# Patient Record
Sex: Female | Born: 1955
Health system: Southern US, Community
[De-identification: ages and names within clinical notes are randomized; demographics above are authoritative.]

## PROBLEM LIST (undated history)

## (undated) DIAGNOSIS — G4700 Insomnia, unspecified: Secondary | ICD-10-CM

## (undated) DIAGNOSIS — G44009 Cluster headache syndrome, unspecified, not intractable: Secondary | ICD-10-CM

## (undated) DIAGNOSIS — I10 Essential (primary) hypertension: Secondary | ICD-10-CM

## (undated) DIAGNOSIS — Z9889 Other specified postprocedural states: Secondary | ICD-10-CM

## (undated) DIAGNOSIS — G35 Multiple sclerosis: Secondary | ICD-10-CM

## (undated) DIAGNOSIS — G709 Myoneural disorder, unspecified: Secondary | ICD-10-CM

## (undated) DIAGNOSIS — R112 Nausea with vomiting, unspecified: Secondary | ICD-10-CM

## (undated) DIAGNOSIS — J302 Other seasonal allergic rhinitis: Secondary | ICD-10-CM

## (undated) DIAGNOSIS — D369 Benign neoplasm, unspecified site: Secondary | ICD-10-CM

## (undated) DIAGNOSIS — E7800 Pure hypercholesterolemia, unspecified: Secondary | ICD-10-CM

## (undated) DIAGNOSIS — C801 Malignant (primary) neoplasm, unspecified: Secondary | ICD-10-CM

## (undated) DIAGNOSIS — I2699 Other pulmonary embolism without acute cor pulmonale: Secondary | ICD-10-CM

## (undated) DIAGNOSIS — K559 Vascular disorder of intestine, unspecified: Secondary | ICD-10-CM

## (undated) DIAGNOSIS — Z923 Personal history of irradiation: Secondary | ICD-10-CM

## (undated) DIAGNOSIS — Z720 Tobacco use: Secondary | ICD-10-CM

## (undated) HISTORY — DX: Cluster headache syndrome, unspecified, not intractable: G44.009

## (undated) HISTORY — DX: Vascular disorder of intestine, unspecified: K55.9

## (undated) HISTORY — DX: Benign neoplasm, unspecified site: D36.9

## (undated) HISTORY — DX: Tobacco use: Z72.0

## (undated) HISTORY — DX: Essential (primary) hypertension: I10

## (undated) HISTORY — PX: BREAST BIOPSY: SHX20

## (undated) HISTORY — DX: Other pulmonary embolism without acute cor pulmonale: I26.99

## (undated) HISTORY — DX: Pure hypercholesterolemia, unspecified: E78.00

## (undated) HISTORY — DX: Insomnia, unspecified: G47.00

## (undated) HISTORY — DX: Other specified postprocedural states: Z98.890

## (undated) HISTORY — DX: Other seasonal allergic rhinitis: J30.2

## (undated) HISTORY — PX: TUBAL LIGATION: SHX77

---

## 2000-09-27 ENCOUNTER — Other Ambulatory Visit: Admission: RE | Admit: 2000-09-27 | Discharge: 2000-09-27 | Payer: Self-pay | Admitting: *Deleted

## 2001-01-04 ENCOUNTER — Ambulatory Visit (HOSPITAL_COMMUNITY): Admission: RE | Admit: 2001-01-04 | Discharge: 2001-01-04 | Payer: Self-pay | Admitting: Pulmonary Disease

## 2001-04-13 DIAGNOSIS — K559 Vascular disorder of intestine, unspecified: Secondary | ICD-10-CM

## 2001-04-13 HISTORY — DX: Vascular disorder of intestine, unspecified: K55.9

## 2001-04-13 HISTORY — PX: COLONOSCOPY: SHX174

## 2002-01-05 ENCOUNTER — Ambulatory Visit (HOSPITAL_COMMUNITY): Admission: RE | Admit: 2002-01-05 | Discharge: 2002-01-05 | Payer: Self-pay | Admitting: Pulmonary Disease

## 2002-01-13 ENCOUNTER — Ambulatory Visit (HOSPITAL_COMMUNITY): Admission: RE | Admit: 2002-01-13 | Discharge: 2002-01-13 | Payer: Self-pay | Admitting: Pulmonary Disease

## 2002-03-02 ENCOUNTER — Ambulatory Visit (HOSPITAL_COMMUNITY): Admission: RE | Admit: 2002-03-02 | Discharge: 2002-03-02 | Payer: Self-pay | Admitting: Pulmonary Disease

## 2002-03-31 ENCOUNTER — Ambulatory Visit (HOSPITAL_COMMUNITY): Admission: RE | Admit: 2002-03-31 | Discharge: 2002-03-31 | Payer: Self-pay | Admitting: Internal Medicine

## 2002-04-13 DIAGNOSIS — D369 Benign neoplasm, unspecified site: Secondary | ICD-10-CM

## 2002-04-13 HISTORY — DX: Benign neoplasm, unspecified site: D36.9

## 2003-01-22 ENCOUNTER — Ambulatory Visit (HOSPITAL_COMMUNITY): Admission: RE | Admit: 2003-01-22 | Discharge: 2003-01-22 | Payer: Self-pay | Admitting: Pulmonary Disease

## 2003-08-10 ENCOUNTER — Ambulatory Visit (HOSPITAL_COMMUNITY): Admission: RE | Admit: 2003-08-10 | Discharge: 2003-08-10 | Payer: Self-pay | Admitting: Pulmonary Disease

## 2003-10-23 ENCOUNTER — Ambulatory Visit (HOSPITAL_COMMUNITY): Admission: RE | Admit: 2003-10-23 | Discharge: 2003-10-23 | Payer: Self-pay | Admitting: Pulmonary Disease

## 2004-01-29 ENCOUNTER — Ambulatory Visit (HOSPITAL_COMMUNITY): Admission: RE | Admit: 2004-01-29 | Discharge: 2004-01-29 | Payer: Self-pay

## 2005-02-03 ENCOUNTER — Ambulatory Visit (HOSPITAL_COMMUNITY): Admission: RE | Admit: 2005-02-03 | Discharge: 2005-02-03 | Payer: Self-pay | Admitting: Pulmonary Disease

## 2005-05-19 ENCOUNTER — Ambulatory Visit (HOSPITAL_COMMUNITY): Admission: RE | Admit: 2005-05-19 | Discharge: 2005-05-19 | Payer: Self-pay | Admitting: Pulmonary Disease

## 2005-05-21 ENCOUNTER — Ambulatory Visit (HOSPITAL_COMMUNITY): Admission: RE | Admit: 2005-05-21 | Discharge: 2005-05-21 | Payer: Self-pay | Admitting: Pulmonary Disease

## 2005-07-29 ENCOUNTER — Encounter: Admission: RE | Admit: 2005-07-29 | Discharge: 2005-07-29 | Payer: Self-pay | Admitting: Neurosurgery

## 2005-09-17 ENCOUNTER — Ambulatory Visit: Payer: Self-pay | Admitting: Internal Medicine

## 2005-09-17 ENCOUNTER — Ambulatory Visit (HOSPITAL_COMMUNITY): Admission: RE | Admit: 2005-09-17 | Discharge: 2005-09-17 | Payer: Self-pay | Admitting: Internal Medicine

## 2005-09-17 ENCOUNTER — Encounter (INDEPENDENT_AMBULATORY_CARE_PROVIDER_SITE_OTHER): Payer: Self-pay | Admitting: *Deleted

## 2005-09-17 HISTORY — PX: COLONOSCOPY: SHX174

## 2006-02-08 ENCOUNTER — Ambulatory Visit (HOSPITAL_COMMUNITY): Admission: RE | Admit: 2006-02-08 | Discharge: 2006-02-08 | Payer: Self-pay | Admitting: Pulmonary Disease

## 2007-02-10 ENCOUNTER — Ambulatory Visit (HOSPITAL_COMMUNITY): Admission: RE | Admit: 2007-02-10 | Discharge: 2007-02-10 | Payer: Self-pay | Admitting: Unknown Physician Specialty

## 2007-03-02 ENCOUNTER — Ambulatory Visit (HOSPITAL_COMMUNITY): Admission: RE | Admit: 2007-03-02 | Discharge: 2007-03-02 | Payer: Self-pay | Admitting: Unknown Physician Specialty

## 2008-03-02 ENCOUNTER — Ambulatory Visit (HOSPITAL_COMMUNITY): Admission: RE | Admit: 2008-03-02 | Discharge: 2008-03-02 | Payer: Self-pay | Admitting: Pulmonary Disease

## 2008-03-23 ENCOUNTER — Ambulatory Visit: Payer: Self-pay | Admitting: Urgent Care

## 2008-09-10 ENCOUNTER — Emergency Department (HOSPITAL_COMMUNITY): Admission: EM | Admit: 2008-09-10 | Discharge: 2008-09-10 | Payer: Self-pay | Admitting: Emergency Medicine

## 2008-09-12 ENCOUNTER — Encounter: Admission: RE | Admit: 2008-09-12 | Discharge: 2008-09-12 | Payer: Self-pay | Admitting: Internal Medicine

## 2008-09-15 ENCOUNTER — Inpatient Hospital Stay (HOSPITAL_COMMUNITY): Admission: EM | Admit: 2008-09-15 | Discharge: 2008-09-25 | Payer: Self-pay | Admitting: Emergency Medicine

## 2009-01-09 ENCOUNTER — Ambulatory Visit (HOSPITAL_COMMUNITY): Admission: RE | Admit: 2009-01-09 | Discharge: 2009-01-09 | Payer: Self-pay | Admitting: Internal Medicine

## 2009-01-15 ENCOUNTER — Ambulatory Visit (HOSPITAL_COMMUNITY): Admission: RE | Admit: 2009-01-15 | Discharge: 2009-01-15 | Payer: Self-pay | Admitting: Internal Medicine

## 2009-03-04 ENCOUNTER — Ambulatory Visit (HOSPITAL_COMMUNITY): Admission: RE | Admit: 2009-03-04 | Discharge: 2009-03-04 | Payer: Self-pay | Admitting: Internal Medicine

## 2010-02-03 ENCOUNTER — Ambulatory Visit (HOSPITAL_COMMUNITY): Admission: RE | Admit: 2010-02-03 | Discharge: 2010-02-03 | Payer: Self-pay | Admitting: Internal Medicine

## 2010-03-13 ENCOUNTER — Ambulatory Visit (HOSPITAL_COMMUNITY): Admission: RE | Admit: 2010-03-13 | Discharge: 2010-03-13 | Payer: Self-pay | Admitting: Internal Medicine

## 2010-05-04 ENCOUNTER — Encounter: Payer: Self-pay | Admitting: Unknown Physician Specialty

## 2010-07-21 LAB — URINALYSIS, ROUTINE W REFLEX MICROSCOPIC
Bilirubin Urine: NEGATIVE
Glucose, UA: NEGATIVE mg/dL
Hgb urine dipstick: NEGATIVE
Ketones, ur: NEGATIVE mg/dL
Nitrite: NEGATIVE
Protein, ur: NEGATIVE mg/dL
Specific Gravity, Urine: 1.019 (ref 1.005–1.030)
Urobilinogen, UA: 1 mg/dL (ref 0.0–1.0)
pH: 7 (ref 5.0–8.0)

## 2010-07-21 LAB — URINE CULTURE: Colony Count: 65000

## 2010-08-26 NOTE — Assessment & Plan Note (Signed)
NAME:  Joan [name], Joan [name]             CHART#:  [id number]   DATE:  03/23/2008                       DOB:  1956/02/04   PRIMARY CARE PHYSICIAN:  Ramon Dredge L. Juanetta Gosling, MD   CHIEF COMPLAINT:  Followup, bloody diarrhea.   HISTORY OF PRESENT ILLNESS:  The patient is a 55 year old Caucasian  female.  She tells me she had been raking leaves back in November.  She  was doing a lot of heavy lifting.  She began to have right-sided low  back pain, which is not uncommon for her.  She was taking 2-3 ibuprofen  2-3 times per day as well as over-the-counter sinus medications.  She  began to have cramps throughout her abdomen.  She had diarrhea with  blood and mucus on one episode.  She called, and she was started on  Cipro and Flagyl for 10 days.  She had a stool culture and C. diff which  she reports was negative, although I do not have copies of this.  She  did have a CT scan of the abdomen and pelvis on March 10, 2008, with  contrast, which was benign, although sigmoid colon was decompressed and  unopacified by contrast.  She had had a previous episode of what was  felt to be ischemic colitis back in 2003.  She has finished her course  of antibiotics.  She is doing well.  She has had no further bleeding.  No nausea, vomiting, fever, chills, or abdominal pain.  She has had some  constipation and is going a couple of days without a bowel movement.   PAST MEDICAL AND SURGICAL HISTORY:  1. History of ischemic colitis back in 2003.  2. History of tubular adenoma removed from cecum on colonoscopy,      December 2003, and on last colonoscopy by Dr. Jena Gauss  on that date,      she had 2 hyperplastic polyps removed from the descending colon in      20 cm.  3. Seasonal allergies.  4. Insomnia.  5. Tubal ligation.  6. Left benign breast biopsy.  7. Pulmonary embolus 26 years ago.  8. Tobacco abuse.   CURRENT MEDICATIONS:  Tylenol p.r.n., ibuprofen 200-400 mg b.i.d. to  t.i.d., over-the-counter sleep  medications p.r.n., and over-the-counter  allergy meds p.r.n.   ALLERGIES:  No known drug allergies.   FAMILY HISTORY:  There is no known family history of colorectal  carcinoma, liver problem, or GI problems.  Mother at age 65 is healthy.  Father at age 14 has history of coronary artery disease.  She has 2  siblings, both of whom are healthy.   SOCIAL HISTORY:  The patient is married.  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  She has a 30-plus-pack-year history of tobacco use.  Denies any alcohol or drug use.   REVIEW OF SYSTEMS:  See HPI, otherwise negative.   PHYSICAL EXAMINATION:  VITAL SIGNS:  Weight 265, height 66 inches,  temperature 97.7, blood pressure 144/88, and pulse 80.  GENERAL:  She is a well-developed, well-nourished Caucasian female in no  acute distress.  HEENT:  Sclerae clear, nonicteric.  Conjunctivae pink.  Oropharynx pink  and moist without any lesions.  NECK:  Supple without mass or thyromegaly.  CHEST:  Heart regular rate and rhythm, normal S1 and S2, without  murmurs,  clicks, rubs, or gallops.  LUNGS:  Clear to auscultation bilaterally.  ABDOMEN:  Positive bowel sounds x4.  No bruits auscultated.  Soft,  nontender, nondistended without palpable mass or hepatosplenomegaly.  No  rebound, tenderness, or guarding.  EXTREMITIES:  Without clubbing or edema.   ASSESSMENT AND PLAN:  1. The patient is a 55 year old Caucasian female who I suspect she has      had another bout of ischemic colitis versus nonsteroidal-anti-      inflammatory-drug-induced gastrointestinal tract bleeding.      Nonetheless, her course was self-limited.  She has had no further      problems.  She does have a history of tubular adenoma, and if she      has recurrent bleeding would consider colonoscopy, otherwise she is      not due until 2012.  2. She has had some chronic constipation recently.  I will plan to add      stool softeners and fiber supplement of choice.  I have  recommended      Benefiber.  She needs to quit smoking.  3. She is going to take the smallest dose possible of ibuprofen and      try Tylenol PM first.  If she is having to take large quantities of      ibuprofen, she is going to speak with Dr. Juanetta Gosling regarding a non-      nonsteroidal-anti-inflammatory pain medication.  Constipation      literature was given for review.  4. She can take MiraLax 17 g daily p.r.n. constipation.  5. Colonoscopy in June 2012.  6. She is going to call us if she has another bout of bleeding.  At      that time, we will do colonoscopy.      Lorenza Burton, N.P.  Electronically Signed     R. Roetta Sessions, M.D.  Electronically Signed   KJ/MEDQ  D:  03/23/2008  T:  03/23/2008  Job:  [id number]   cc:   Ramon Dredge L. Juanetta Gosling, M.D.
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
## 2010-08-29 NOTE — Consult Note (Signed)
NAME:  Joan [name], Joan [name]                        ACCOUNT NO.:  [id number]   MEDICAL RECORD NO.:  [id number]                  PATIENT TYPE:   LOCATION:                                       FACILITY:  [facility]   PHYSICIAN:  R. Roetta Sessions, M.D.              DATE OF BIRTH:  1956-03-17   DATE OF CONSULTATION:  03/22/2002  DATE OF DISCHARGE:                                   CONSULTATION   REASON FOR CONSULTATION:  Hematochezia, colitis.   HISTORY OF PRESENT ILLNESS:  The patient is a 55 year old Caucasian female  patient of Dr. Juanetta Gosling who presents today for further evaluation of a recent  episode of abdominal pain associated with hematochezia and evidence of  colitis on CT.  She woke up one morning in late November 2003 with severe  abdominal pain which felt like she needed to have diarrhea.  When she went  to the bathroom she passed blood with mucus, moderate amount.  She had  several episodes throughout the day.  This went on for approximately eight  to 10 hours.  She went to see Dr. Juanetta Gosling who arranged her to have a CT of  the abdomen and pelvis on the same day.  This revealed bowel wall thickening  associated with a distal descending colon and the sigmoid colon, a  questionable haustral thickening within the transverse colon and the splenic  flexure.  There was no evidence of diverticula seen in the descending or  sigmoid colon where the region of thickening was.  It was felt that this was  nonspecific appearance which could be related to ischemic colitis,  inflammatory colitis, or infectious colitis.  She also had a 1.7 cm  partially collapsed right ovarian cyst.  Terminal ileum revealed no CT  evidence of Crohn's disease.  Appendix appeared normal.  The patient also  was noted to have elevated white count per her report.  She was started on  two oral antibiotics which she cannot recall the names.  She rapidly  improved and actually symptoms lasted less than 24 hours.  She  complains of  subjective fever at that time.  Denies any nausea or vomiting, heartburn,  dysphagia, odynophagia.  Her bowels generally move every other day prior to  this episode.  She is back to normal bowel pattern at this time.  She has  had no further rectal bleeding or any melena.  She does take BC powders and  Aleve p.r.n. headache, although states that she will go weeks at a time  without taking any medication.  The patient was treated for an abscessed  tooth with antibiotics for two weeks prior to this episode of hematochezia  and abdominal pain.   CURRENT MEDICATIONS:  1. Multivitamin q.d.  2. Tylenol p.r.n.  3. BC powder p.r.n.  4. Aleve p.r.n.   ALLERGIES:  No known drug allergies.   PAST MEDICAL HISTORY:  1. Headache.  2.  Remote history of pulmonary embolus felt to be due to birth control     pills.   PAST SURGICAL HISTORY:  1. Benign breast biopsy.  2. Tubal ligation.   FAMILY HISTORY:  Negative for colorectal cancer, inflammatory bowel disease,  liver disease, or any other chronic GI illnesses.   SOCIAL HISTORY:  She has been married for 25 years.  She has two grown sons.  She works for __________ [employer]  She smokes a pack of cigarettes daily and  smoked for over 20 years.  Denies any alcohol use.   REVIEW OF SYSTEMS:  Please see HPI for GI.  GENERAL:  Denies any weight  loss.  CARDIOPULMONARY:  Denies any chest pain or shortness of breath.  GENITOURINARY:  Denies any dysuria, urinary frequency, or hematuria.   PHYSICAL EXAMINATION:  VITAL SIGNS:  Weight 198, height 5 feet 6.5 inches,  temperature 97.9, blood pressure 130/78, pulse 92.  GENERAL:  Pleasant, well-nourished, well-developed Caucasian female in no  acute distress.  SKIN:  Warm and dry.  No jaundice.  HEENT:  Pupils are equal, round, and reactive to light.  Conjunctivae are  pink.  Sclerae nonicteric.  Oropharyngeal mucosa moist and pink.  No  lesions, erythema, or exudate.  NECK:  No  lymphadenopathy, thyromegaly, carotid bruits.  CHEST:  Lungs are clear to auscultation.  CARDIAC:  Regular rate and rhythm.  Normal S1, S2.  No murmurs, rubs, or  gallops.  ABDOMEN:  Positive bowel sounds, soft, nontender, nondistended.  No  organomegaly or masses.  EXTREMITIES:  No edema.   IMPRESSION:  The patient is a pleasant 55 year old lady who had a less than  24-hour history of moderate amount of bloody mucus per rectum associated  with acute onset abdominal pain.  CT revealed evidence of bowel wall  thickening in the distal descending colon and sigmoid colon as well as  possible haustral thickening within the transverse colon and the splenic  flexure.  Her colitis was symptomatically better within 24 hours.  She was  treated with course of antibiotics.  It is unclear at this time if she had  infectious colitis versus possible ischemic or inflammatory colitis.  It was  reassuring that she is better at this point.  Given the fact that she did  not have any diarrhea with this, this would make infectious colitis less  likely.  Ischemic colitis is most likely a possibility.  I did discuss with  her today that we ought to proceed with colonoscopy for further evaluation  of these symptoms.  We need to rule out etiologies such as inflammatory  bowel disease as well as polyps or colon cancer.  We discussed risks,  alternatives, and benefits with the patient and she is agreeable to proceed.   PLAN:  1. Colonoscopy in the near future.  2. She will hold her aspirin products for at least five days prior to     procedure.  3. Further recommendations to follow.     Tana Coast, Pricilla Larsson, M.D.   LL/MEDQ  D:  03/22/2002  T:  03/22/2002  Job:  [id number]   cc:   Ramon Dredge L. Juanetta Gosling, M.D.  [address]  [place]  [place] [address]  Fax: [phone]

## 2010-08-29 NOTE — Op Note (Signed)
NAME:  Joan [name], Joan [name]            ACCOUNT NO.:  [id number]   MEDICAL RECORD NO.:  [id number]          PATIENT TYPE:  AMB   LOCATION:  DAY                           FACILITY:  [facility]   PHYSICIAN:  R. Roetta Sessions, M.D. DATE OF BIRTH:  [birth year]   DATE OF PROCEDURE:  09/17/2005  DATE OF DISCHARGE:                                 OPERATIVE REPORT   PROCEDURE PERFORMED:  Colonoscopy with snare polypectomy, biopsy, lesion  ablation.   INDICATIONS FOR PROCEDURE:  The patient is a 55 year old Caucasian female  who was found to have a colonic adenoma in her cecum which was removed back  in 2004.  She is here for surveillance.  She is having no lower GI tract  symptoms.  This approach has been discussed with the patient at length.  Potential risks, benefits and alternatives have been reviewed, questions  have been answered, she is agreeable.  Please see documentation in the  medical records.   PROCEDURE NOTE:  Oxygen saturations, blood pressure, pulse and respirations  were monitored throughout the entire procedure.   CONSCIOUS SEDATION:  Versed 3 mg IV, Demerol 75 mg IV in divided doses.   INSTRUMENT USED:  Olympus video chip system.   FINDINGS:  Digital exam revealed no abnormalities.   ENDOSCOPIC FINDINGS:  Prep was adequate.   Rectum:  Examination of the rectal mucosa including retroflex view of the  anal verge revealed multiple 2 to 3 mm polyps.  The remainder of the rectal  mucosa appeared normal.  At the rectosigmoid junction there was a 4 mm polyp  which was sessile.   The colonic mucosa was surveyed from the rectosigmoid junction to the left,  transverse and right colon to the area of the appendiceal orifice and  ileocecal valve and cecum.  These structures were well seen and photographed  for the record.  From this level, the scope was slowly withdrawn.  All  previously mentioned mucosal surfaces were again seen.  There was a 5 mm  polyp on a broad stalk in the  middescending colon.  The remainder of the  colonic mucosa appeared normal.  The polyp in the middescending colon was  removed with cold snare technique.  The polyp at 20 cm removed with cold  biopsy forceps technique.  Multiple diminutive polyps in the rectum were  ablated with the tip of the hot snare cautery unit.  The patient tolerated  the procedure well, was reacted in endoscopy.   IMPRESSION:  1.  Multiple diminutive rectal polyps ablated as described above.  20 cm      polyp, cold biopsied/removed.  Remaining rectal mucosa appeared normal.  2.  Middescending colon polyp removed with snare.  Remainder of colonic      mucosa appeared normal.   RECOMMENDATIONS:  1.  No aspirin or arthritis medications for 10 days.  2.  Follow-up on pathology.  3.  Further recommendations to follow.      Jonathon Bellows, M.D.  Electronically Signed     RMR/MEDQ  D:  09/17/2005  T:  09/17/2005  Job:  [id number]   cc:  Edward L. Luan Pulling, M.D.  Fax: [phone]

## 2010-08-29 NOTE — Op Note (Signed)
NAME:  Joan [name], Joan [name]                      ACCOUNT NO.:  [id number]   MEDICAL RECORD NO.:  [id number]                   PATIENT TYPE:  AMB   LOCATION:  DAY                                  FACILITY:  [facility]   PHYSICIAN:  R. Roetta Sessions, M.D.              DATE OF BIRTH:  01-04-1956   DATE OF PROCEDURE:  03/31/2002  DATE OF DISCHARGE:                                 OPERATIVE REPORT   PROCEDURE:  Colonoscopy with biopsy.   INDICATION FOR PROCEDURE:  The patient is [name] 55 year old lady with [name] recent  episode of crampy abdominal pain and hematochezia.  CT demonstrated colitis.  Clinically, her acute illness came and went over [name] 24-hour period.  She is  currently devoid of any lower GI tract symptoms.  Colonoscopy is now being  done to further evaluate her recent symptoms and the CT abnormalities.  The  approach has been discussed with the patient at length, and the risks,  benefits, and alternatives have been reviewed.  ASA-1.   DESCRIPTION OF PROCEDURE:  O2 saturation, blood pressure, pulse, and  respirations were monitored throughout the entire procedure.   Conscious sedation:  Versed 3 mg IV, Demerol 50 mg IV in divided doses.   Instrument:  Olympus video chip adult colonoscope.   Findings:  Digital rectal exam revealed no abnormalities.   Endoscopic findings:  Prep was excellent.   Rectum:  Examination of the rectal mucosa, including [name] retroflexed view of  the anal verge, revealed only internal hemorrhoids.  The remainder of the  rectal mucosa appeared normal.   Colon:  The colonic mucosa was surveyed from the rectosigmoid junction  through the left, transverse, and right colon, through the area of the  appendiceal orifice, ileocecal valve, and cecum.  These structures were well-  seen and photographed for the record.  The patient was noted to have three 2-  3 mm sessile polyps at 30 cm, cold biopsied/removed.  There was another  single 3 mm polyp adjacent to the  appendiceal orifice, which was cold  biopsied/removed.  The remainder of the colonic mucosa appeared normal.  The  terminal ileum was intubated to 10 cm.  This segment of the GI tract  appeared normal.  From the level of the ileocecal valve/cecum, the scope was  slowly and cautiously withdrawn.  All previously-mentioned mucosal surfaces  were again seen.  No other abnormalities were observed.  The patient  tolerated the procedure well and was reactive in endoscopy.   IMPRESSION:  1. Internal hemorrhoids, otherwise normal rectum.  2. Diminutive polyps in the sigmoid and cecum as described above, cold     biopsied/removed.  The remainder of the colonic mucosa appeared normal.  3. Normal terminal ileum.   I suspect the patient most likely experienced an episode of ischemic or  segmental colitis.  Clinically her symptoms came and went within 24 hours,  which is really too fast for an  infectious agent.  I do note that she does  smoke and, interestingly, has [name] history of pulmonary embolism previously.   RECOMMENDATIONS:  1. Stop smoking.  2. No further GI evaluation warranted for her recent acute illness unless     she has recurrent symptoms, which is unlikely.  3. Follow up on pathology.  4. Further recommendations to follow.                                               Jonathon Bellows, M.D.    RMR/MEDQ  D:  03/31/2002  T:  04/01/2002  Job:  [id number]   cc:   Ramon Dredge L. Juanetta Gosling, M.D.  [address]  [place]  [place] [address]  Fax: [phone]

## 2010-09-19 ENCOUNTER — Encounter: Payer: Self-pay | Admitting: Internal Medicine

## 2011-02-13 ENCOUNTER — Telehealth: Payer: Self-pay | Admitting: Internal Medicine

## 2011-02-13 NOTE — Telephone Encounter (Signed)
Pt has hx of polyps and is scheduled an OV with Gerrit Halls, NP on 02/23/2011 @ 8:30 AM.

## 2011-02-13 NOTE — Telephone Encounter (Signed)
Pt received letter awhile back and is ready to set up her colonoscopy. She will be home today until 10am if we could call her before then. [phone]

## 2011-02-20 ENCOUNTER — Encounter: Payer: Self-pay | Admitting: Gastroenterology

## 2011-02-23 ENCOUNTER — Encounter: Payer: Self-pay | Admitting: Gastroenterology

## 2011-02-23 ENCOUNTER — Ambulatory Visit (INDEPENDENT_AMBULATORY_CARE_PROVIDER_SITE_OTHER): Payer: BC Managed Care – PPO | Admitting: Gastroenterology

## 2011-02-23 VITALS — BP 120/74 | HR 74 | Temp 97.6°F | Ht 66.0 in | Wt 194.4 lb

## 2011-02-23 DIAGNOSIS — D369 Benign neoplasm, unspecified site: Secondary | ICD-10-CM

## 2011-02-23 NOTE — Patient Instructions (Signed)
We have set you up for a colonoscopy with Dr. Jena Gauss.  Further recommendations to follow in the near future.

## 2011-02-23 NOTE — Progress Notes (Signed)
Primary Care Physician:  Dwana Melena, MD Primary Gastroenterologist:  Dr. Jena Gauss  Chief Complaint  Patient presents with  . Colonoscopy    HPI:   Joan [name] is a pleasant 55 year old female who presents today for surveillance colonoscopy. She has a history of a tubular adenoma in 2004; repeat colonoscopy in 2007 showed hyperplastic polyps. She is due for surveillance now, 2012.  She denies any abdominal pain, nausea or vomiting. She denies any change in bowel habits, constipation, or diarrhea. She denies any blood in stool, weight loss, or lack of appetite. She states she is doing great without any concerns at this visit.   Past Medical History  Diagnosis Date  . PE (pulmonary embolism)     ?birth control  . Headaches, cluster   . Seasonal allergies   . Insomnia   . Tobacco abuse   . Colitis, ischemic 2003  . Hypertension   . Hypercholesterolemia   . Tubular adenoma 2004    colonoscopy 2004  . S/P colonoscopy [id number]    2004: tubular adeboma, 2007: hyperplastic polyps. Due 2012    Past Surgical History  Procedure Date  . Colonoscopy 09/17/05    20 cm polyp removed/bx/middescending colon polyp removed with snare  . Tubal ligation   . Breast biopsy     benign  . Colonoscopy 2003    tubular adenoma removed from cecum     Current Outpatient Prescriptions  Medication Sig Dispense Refill  . CRESTOR 5 MG tablet Take 5 mg by mouth daily.       . valsartan-hydrochlorothiazide (DIOVAN-HCT) 80-12.5 MG per tablet Take 1 tablet by mouth daily.          Allergies as of 02/23/2011  . (No Known Allergies)    Family History  Problem Relation Age of Onset  . Colon cancer Neg Hx     History   Social History  . Marital Status: Married    Spouse Name: N/A    Number of Children: N/A  . Years of Education: N/A   Occupational History  . [employer]    Social History Main Topics  . Smoking status: Current Everyday Smoker -- 1.0 packs/day    Types: Cigarettes  .  Smokeless tobacco: Not on file  . Alcohol Use: No  . Drug Use: No  . Sexually Active: Not on file   Other Topics Concern  . Not on file   Social History Narrative  . No narrative on file    Review of Systems: Gen: Denies any fever, chills, fatigue, weight loss, lack of appetite.  CV: Denies chest pain, heart palpitations, peripheral edema, syncope.  Resp: Denies shortness of breath at rest or with exertion. Denies wheezing or cough.  GI: Denies dysphagia or odynophagia. Denies jaundice, hematemesis, fecal incontinence. GU : Denies urinary burning, urinary frequency, urinary hesitancy MS: Denies joint pain, muscle weakness, cramps, or limitation of movement.  Derm: Denies rash, itching, dry skin Psych: Denies depression, anxiety, memory loss, and confusion Heme: Denies bruising, bleeding, and enlarged lymph nodes.  Physical Exam: BP 120/74  Pulse 74  Temp(Src) 97.6 F (36.4 C) (Temporal)  Ht 5\' 6"  (1.676 m)  Wt 194 lb 6.4 oz (88.179 kg)  BMI 31.38 kg/m2 General:   Alert and oriented. Pleasant and cooperative. Well-nourished and well-developed.  Head:  Normocephalic and atraumatic. Eyes:  Without icterus, sclera clear and conjunctiva pink.  Ears:  Normal auditory acuity. Nose:  No deformity, discharge,  or lesions. Mouth:  No deformity or lesions, oral mucosa  pink.  Neck:  Supple, without mass or thyromegaly. Lungs:  Clear to auscultation bilaterally. No wheezes, rales, or rhonchi. No distress.  Heart:  S1, S2 present without murmurs appreciated.  Abdomen:  +BS, soft, non-tender and non-distended. No HSM noted. No guarding or rebound. No masses appreciated.  Rectal:  Deferred  Msk:  Symmetrical without gross deformities. Normal posture. Extremities:  Without clubbing or edema. Neurologic:  Alert and  oriented x4;  grossly normal neurologically. Skin:  Intact without significant lesions or rashes. Cervical Nodes:  No significant cervical adenopathy. Psych:  Alert and  cooperative. Normal mood and affect.

## 2011-02-23 NOTE — Assessment & Plan Note (Signed)
55 year old female with hx of tubular adenoma found on colonoscopy in 2004; f/u colonoscopy in 2007 with hyperplastic polyps. She is due for 5 year surveillance at this time. She has absolutely no lower GI symptoms presently. She is doing quite well, and she is ready to proceed with a colonoscopy around December time-frame.  Proceed with TCS with Dr. Jena Gauss in near future: the risks, benefits, and alternatives have been discussed with the patient in detail. The patient states understanding and desires to proceed.

## 2011-02-23 NOTE — Progress Notes (Signed)
Cc to PCP 

## 2011-02-25 ENCOUNTER — Telehealth: Payer: Self-pay | Admitting: Gastroenterology

## 2011-02-25 NOTE — Telephone Encounter (Signed)
Pt called to set up her procedure. 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 Pt said we couldn't reach her at her work number and she gave me dates that she would be available for procedure. December 12,13,14,1718,19 or 20th. She said we could leave a message on her home phone.

## 2011-03-02 ENCOUNTER — Other Ambulatory Visit: Payer: Self-pay | Admitting: Gastroenterology

## 2011-03-02 ENCOUNTER — Encounter: Payer: Self-pay | Admitting: Gastroenterology

## 2011-03-02 DIAGNOSIS — D369 Benign neoplasm, unspecified site: Secondary | ICD-10-CM

## 2011-03-02 NOTE — Telephone Encounter (Signed)
Scheduled pt for 03/31/11 @ 10:30- LMOM and instructions mailed

## 2011-03-09 ENCOUNTER — Other Ambulatory Visit (HOSPITAL_COMMUNITY): Payer: Self-pay | Admitting: Internal Medicine

## 2011-03-09 DIAGNOSIS — Z139 Encounter for screening, unspecified: Secondary | ICD-10-CM

## 2011-03-17 ENCOUNTER — Ambulatory Visit (HOSPITAL_COMMUNITY)
Admission: RE | Admit: 2011-03-17 | Discharge: 2011-03-17 | Disposition: A | Discharge status: Home / Self Care | Payer: BC Managed Care – PPO | Source: Ambulatory Visit | Attending: Internal Medicine | Admitting: Internal Medicine

## 2011-03-17 DIAGNOSIS — Z139 Encounter for screening, unspecified: Secondary | ICD-10-CM

## 2011-03-17 DIAGNOSIS — Z1231 Encounter for screening mammogram for malignant neoplasm of breast: Secondary | ICD-10-CM | POA: Insufficient documentation

## 2011-03-19 ENCOUNTER — Encounter (HOSPITAL_COMMUNITY): Payer: Self-pay | Admitting: Pharmacy Technician

## 2011-03-30 MED ORDER — SODIUM CHLORIDE 0.45 % IV SOLN
Freq: Once | INTRAVENOUS | Status: AC
  Administered 2011-03-31: 10:00:00 via INTRAVENOUS

## 2011-03-31 ENCOUNTER — Ambulatory Visit (HOSPITAL_COMMUNITY)
Admission: RE | Admit: 2011-03-31 | Discharge: 2011-03-31 | Disposition: A | Discharge status: Home / Self Care | Payer: BC Managed Care – PPO | Source: Ambulatory Visit | Attending: Internal Medicine | Admitting: Internal Medicine

## 2011-03-31 ENCOUNTER — Encounter (HOSPITAL_COMMUNITY): Payer: Self-pay | Admitting: *Deleted

## 2011-03-31 ENCOUNTER — Other Ambulatory Visit: Payer: Self-pay | Admitting: Internal Medicine

## 2011-03-31 ENCOUNTER — Encounter (HOSPITAL_COMMUNITY)
Admission: RE | Disposition: A | Discharge status: Home / Self Care | Payer: Self-pay | Source: Ambulatory Visit | Attending: Internal Medicine

## 2011-03-31 DIAGNOSIS — D369 Benign neoplasm, unspecified site: Secondary | ICD-10-CM

## 2011-03-31 DIAGNOSIS — D126 Benign neoplasm of colon, unspecified: Secondary | ICD-10-CM | POA: Insufficient documentation

## 2011-03-31 DIAGNOSIS — I10 Essential (primary) hypertension: Secondary | ICD-10-CM | POA: Insufficient documentation

## 2011-03-31 DIAGNOSIS — Z860100 Personal history of colon polyps, unspecified: Secondary | ICD-10-CM | POA: Insufficient documentation

## 2011-03-31 DIAGNOSIS — K620 Anal polyp: Secondary | ICD-10-CM

## 2011-03-31 DIAGNOSIS — Z79899 Other long term (current) drug therapy: Secondary | ICD-10-CM | POA: Insufficient documentation

## 2011-03-31 DIAGNOSIS — K621 Rectal polyp: Secondary | ICD-10-CM

## 2011-03-31 DIAGNOSIS — K5730 Diverticulosis of large intestine without perforation or abscess without bleeding: Secondary | ICD-10-CM | POA: Insufficient documentation

## 2011-03-31 DIAGNOSIS — E7800 Pure hypercholesterolemia, unspecified: Secondary | ICD-10-CM | POA: Insufficient documentation

## 2011-03-31 HISTORY — PX: COLONOSCOPY: SHX5424

## 2011-03-31 SURGERY — COLONOSCOPY
Anesthesia: Moderate Sedation

## 2011-03-31 MED ORDER — MEPERIDINE HCL 100 MG/ML IJ SOLN
INTRAMUSCULAR | Status: AC
  Filled 2011-03-31: qty 1

## 2011-03-31 MED ORDER — ONDANSETRON HCL 4 MG/2ML IJ SOLN
INTRAMUSCULAR | Status: AC
  Filled 2011-03-31: qty 2

## 2011-03-31 MED ORDER — STERILE WATER FOR IRRIGATION IR SOLN
Status: DC | PRN
  Administered 2011-03-31: 10:00:00

## 2011-03-31 MED ORDER — MIDAZOLAM HCL 5 MG/5ML IJ SOLN
INTRAMUSCULAR | Status: DC | PRN
  Administered 2011-03-31 (×2): 2 mg via INTRAVENOUS
  Administered 2011-03-31 (×3): 1 mg via INTRAVENOUS

## 2011-03-31 MED ORDER — MIDAZOLAM HCL 5 MG/5ML IJ SOLN
INTRAMUSCULAR | Status: AC
  Filled 2011-03-31: qty 10

## 2011-03-31 MED ORDER — ONDANSETRON HCL 4 MG/2ML IJ SOLN
INTRAMUSCULAR | Status: DC | PRN
  Administered 2011-03-31: 4 mg via INTRAVENOUS

## 2011-03-31 MED ORDER — MEPERIDINE HCL 100 MG/ML IJ SOLN
INTRAMUSCULAR | Status: DC | PRN
  Administered 2011-03-31: 25 mg via INTRAVENOUS
  Administered 2011-03-31: 50 mg via INTRAVENOUS

## 2011-03-31 NOTE — H&P (Signed)
  I have seen & examined the patient prior to the procedure(s) today and reviewed the history and physical/consultation.  There have been no changes.  After consideration of the risks, benefits, alternatives and imponderables, the patient has consented to the procedure(s).   

## 2011-03-31 NOTE — Op Note (Signed)
[facility] [address] [place], [place]  [address]  COLONOSCOPY PROCEDURE REPORT  PATIENT:  Joan [name], Joan [name]  MR#:  [id number] BIRTHDATE:  09/25/1955, 55 yrs. old  GENDER:  female ENDOSCOPIST:  R. Roetta Sessions, MD FACP [facility] REF. BY:          Dr. Catalina Pizza PROCEDURE DATE:  03/31/2011 PROCEDURE:   Colonoscopy with snare polypectomies and polyp ablation  INDICATIONS:   History colonic adenoma  INFORMED CONSENT:  The risks, benefits, alternatives and imponderables including but not limited to bleeding, perforation as well as the possibility of a missed lesion have been reviewed. The potential for biopsy, lesion removal, etc. have also been discussed.  Questions have been answered.  All parties agreeable. Please see the history and physical in the medical record for more information.  MEDICATIONS:   Versed 7 mg IV and Demerol 75 mg IV in divided doses. Zofran 4 mg IV prior to the procedure  DESCRIPTION OF PROCEDURE:  After a digital rectal exam was performed, the EC-[id number] ([id number]) colonoscope was advanced from the anus through the rectum and colon to the area of the cecum, ileocecal valve and appendiceal orifice.  The cecum was deeply intubated.  These structures were well-seen and photographed for the record.  From the level of the cecum and ileocecal valve, the scope was slowly and cautiously withdrawn.  The mucosal surfaces were carefully surveyed utilizing scope tip deflection to facilitate fold flattening as needed.  The scope was pulled down into the rectum where a thorough examination including retroflexion was performed. <<PROCEDUREIMAGES>>  FINDINGS: adequate preparation. early sigmoid diverticula ; multiple diminutive rectal and sigmoid polyps as well as a couple of diminutive polyps in the ascending segment. A single 7 mm polyp on a fold in the ascending segment and a 5 mm polyp in the mid sigmoid segment.  THERAPEUTIC / DIAGNOSTIC MANEUVERS  PERFORMED: the larger polyps in the sigmoid and descending segments were hot snare removed. multiple diminutive polyps noted above were ablated with the tip of the snare  cautery loop.  COMPLICATIONS:   none  CECAL WITHDRAWAL TIME: 18 minutes  IMPRESSION:    Multiple colonic polyps treated as described above. Sigmoid diverticulosis.  RECOMMENDATIONS:   Followup on Pathology  ______________________________ R. Roetta Sessions, MD Caleen Essex  CC:  Dwana Melena MD  n. eSIGNED:   R. Roetta Sessions at 03/31/2011 11:10 AM  Otho Bellows, [phone]

## 2011-04-13 ENCOUNTER — Encounter (HOSPITAL_COMMUNITY): Payer: Self-pay | Admitting: Internal Medicine

## 2011-04-13 ENCOUNTER — Telehealth: Payer: Self-pay | Admitting: Internal Medicine

## 2011-04-13 NOTE — Telephone Encounter (Signed)
Spoke with pt- informed her of her results from RMR's letter. Also informed her that letter was mailed and she should be getting it soon.

## 2011-04-13 NOTE — Telephone Encounter (Signed)
Pt is calling to see if her results are back from her TCS. Please call her at [phone]

## 2011-04-24 NOTE — H&P (Signed)
  I have seen & examined the patient prior to the procedure(s) today and reviewed the history and physical/consultation.  There have been no changes.  After consideration of the risks, benefits, alternatives and imponderables, the patient has consented to the procedure(s).   

## 2011-04-28 ENCOUNTER — Other Ambulatory Visit (HOSPITAL_COMMUNITY): Payer: Self-pay

## 2011-05-05 ENCOUNTER — Other Ambulatory Visit (HOSPITAL_COMMUNITY): Payer: Self-pay | Admitting: Internal Medicine

## 2011-05-05 ENCOUNTER — Ambulatory Visit (HOSPITAL_COMMUNITY)
Admission: RE | Admit: 2011-05-05 | Discharge: 2011-05-05 | Disposition: A | Discharge status: Home / Self Care | Payer: BC Managed Care – PPO | Source: Ambulatory Visit | Attending: Internal Medicine | Admitting: Internal Medicine

## 2011-05-05 DIAGNOSIS — I10 Essential (primary) hypertension: Secondary | ICD-10-CM | POA: Insufficient documentation

## 2011-05-05 DIAGNOSIS — R05 Cough: Secondary | ICD-10-CM | POA: Insufficient documentation

## 2011-05-05 DIAGNOSIS — J40 Bronchitis, not specified as acute or chronic: Secondary | ICD-10-CM

## 2011-05-05 DIAGNOSIS — R059 Cough, unspecified: Secondary | ICD-10-CM | POA: Insufficient documentation

## 2011-05-05 DIAGNOSIS — R0602 Shortness of breath: Secondary | ICD-10-CM | POA: Insufficient documentation

## 2011-05-05 MED ORDER — ALBUTEROL SULFATE (5 MG/ML) 0.5% IN NEBU
2.5000 mg | INHALATION_SOLUTION | Freq: Once | RESPIRATORY_TRACT | Status: AC
  Administered 2011-05-05: 2.5 mg via RESPIRATORY_TRACT

## 2011-05-05 NOTE — Procedures (Signed)
NAME:  Joan [name], REIERSON            ACCOUNT NO.:  [id number]  MEDICAL RECORD NO.:  [id number]  LOCATION:  RAD                           FACILITY:  [facility]  PHYSICIAN:  Iyonnah Ferrante L. Juanetta Gosling, M.D.DATE OF BIRTH:  1955-04-21  DATE OF PROCEDURE: DATE OF DISCHARGE:                           PULMONARY FUNCTION TEST   Reason for pulmonary function testing is shortness of breath. 1. Spirometry shows no ventilatory defect and minimal airflow     obstruction. 2. Lung volumes are normal. 3. DLCO is moderately reduced, but does correct to some extent, and     when ventilation is taking into account. 4. There is no significant bronchodilator improvement. 5. This study does not show definite cause of shortness of breath.     Kohner Orlick L. Juanetta Gosling, M.D.     ELH/MEDQ  D:  05/05/2011  T:  05/05/2011  Job:  [id number]

## 2011-05-05 NOTE — H&P (Addendum)
HPI:  Joan [name] is a pleasant 56 year old female who presents today for surveillance colonoscopy. She has a history of a tubular adenoma in 2004; repeat colonoscopy in 2007 showed hyperplastic polyps. She is due for surveillance now, 2012.  She denies any abdominal pain, nausea or vomiting. She denies any change in bowel habits, constipation, or diarrhea. She denies any blood in stool, weight loss, or lack of appetite. She states she is doing great without any concerns at this visit.  Past Medical History   Diagnosis  Date   .  PE (pulmonary embolism)      ?birth control   .  Headaches, cluster    .  Seasonal allergies    .  Insomnia    .  Tobacco abuse    .  Colitis, ischemic  2003   .  Hypertension    .  Hypercholesterolemia    .  Tubular adenoma  2004     colonoscopy 2004   .  S/P colonoscopy  [id number]     2004: tubular adeboma, 2007: hyperplastic polyps. Due 2012    Past Surgical History   Procedure  Date   .  Colonoscopy  09/17/05     20 cm polyp removed/bx/middescending colon polyp removed with snare   .  Tubal ligation    .  Breast biopsy      benign   .  Colonoscopy  2003     tubular adenoma removed from cecum    Current Outpatient Prescriptions   Medication  Sig  Dispense  Refill   .  CRESTOR 5 MG tablet  Take 5 mg by mouth daily.     .  valsartan-hydrochlorothiazide (DIOVAN-HCT) 80-12.5 MG per tablet  Take 1 tablet by mouth daily.      Allergies as of 02/23/2011   .  (No Known Allergies)    Family History   Problem  Relation  Age of Onset   .  Colon cancer  Neg Hx     History    Social History   .  Marital Status:  Married     Spouse Name:  N/A     Number of Children:  N/A   .  Years of Education:  N/A    Occupational History   .  [employer]     Social History Main Topics   .  Smoking status:  Current Everyday Smoker -- 1.0 packs/day     Types:  Cigarettes   .  Smokeless tobacco:  Not on file   .  Alcohol Use:  No   .  Drug Use:  No   .   Sexually Active:  Not on file    Other Topics  Concern   .  Not on file    Social History Narrative   .  No narrative on file    Review of Systems:  Gen: Denies any fever, chills, fatigue, weight loss, lack of appetite.  CV: Denies chest pain, heart palpitations, peripheral edema, syncope.  Resp: Denies shortness of breath at rest or with exertion. Denies wheezing or cough.  GI: Denies dysphagia or odynophagia. Denies jaundice, hematemesis, fecal incontinence.  GU : Denies urinary burning, urinary frequency, urinary hesitancy  MS: Denies joint pain, muscle weakness, cramps, or limitation of movement.  Derm: Denies rash, itching, dry skin  Psych: Denies depression, anxiety, memory loss, and confusion  Heme: Denies bruising, bleeding, and enlarged lymph nodes.  Physical Exam:  General: Alert and oriented. Pleasant and cooperative.  Well-nourished and well-developed.  Head: Normocephalic and atraumatic.  Eyes: Without icterus, sclera clear and conjunctiva pink.  Ears: Normal auditory acuity.  Nose: No deformity, discharge, or lesions.  Mouth: No deformity or lesions, oral mucosa pink.  Neck: Supple, without mass or thyromegaly.  Lungs: Clear to auscultation bilaterally. No wheezes, rales, or rhonchi. No distress.  Heart: S1, S2 present without murmurs appreciated.  Abdomen: +BS, soft, non-tender and non-distended. No HSM noted. No guarding or rebound. No masses appreciated.  Rectal: Deferred  Msk: Symmetrical without gross deformities. Normal posture.  Extremities: Without clubbing or edema.  Neurologic: Alert and oriented x4; grossly normal neurologically.  Skin: Intact without significant lesions or rashes.  Cervical Nodes: No significant cervical adenopathy.  Psych: Alert and cooperative. Normal mood and affect.    Hx of Tubular adenoma -  56 year old female with hx of tubular adenoma found on colonoscopy in 2004; f/u colonoscopy in 2007 with hyperplastic polyps. She is due for  5 year surveillance at this time. She has absolutely no lower GI symptoms presently. She is doing quite well, and she is ready to proceed with a colonoscopy around December time-frame.  Proceed with TCS  in near future: the risks, benefits, and alternatives have been discussed with the patient in detail. The patient states understanding and desires to proceed.     This H&P was entered as late documentation on 05/05/2011 for the procedure on 03/31/11.

## 2011-05-27 NOTE — Procedures (Signed)
Joan [name], Joan [name]            ACCOUNT NO.:  [id number]  MEDICAL RECORD NO.:  [id number]  LOCATION:                                 FACILITY:  PHYSICIAN:  Sophira Rumler L. Juanetta Gosling, M.D.DATE OF BIRTH:  08/12/1955  DATE OF PROCEDURE: DATE OF DISCHARGE:                           PULMONARY FUNCTION TEST   Reason for pulmonary function testing is shortness of breath. 1. Spirometry does not show a ventilatory defect and shows minimal     airflow obstruction. 2. Lung volumes are normal. 3. DLCO is slightly reduced but does improve when corrected for     ventilation. 4. There is no significant bronchodilator improvement. 5. There is no definite cause of shortness of breath seen on this     pulmonary function test.     Ramon Dredge L. Juanetta Gosling, M.D.     ELH/MEDQ  D:  05/26/2011  T:  05/26/2011  Job:  [id number]  cc:   Catalina Pizza, M.D. Fax: [phone]

## 2012-03-03 ENCOUNTER — Other Ambulatory Visit (HOSPITAL_COMMUNITY): Payer: Self-pay | Admitting: Internal Medicine

## 2012-03-03 DIAGNOSIS — Z139 Encounter for screening, unspecified: Secondary | ICD-10-CM

## 2012-03-18 ENCOUNTER — Ambulatory Visit (HOSPITAL_COMMUNITY)
Admission: RE | Admit: 2012-03-18 | Discharge: 2012-03-18 | Disposition: A | Discharge status: Home / Self Care | Payer: BC Managed Care – PPO | Source: Ambulatory Visit | Attending: Internal Medicine | Admitting: Internal Medicine

## 2012-03-18 DIAGNOSIS — Z1231 Encounter for screening mammogram for malignant neoplasm of breast: Secondary | ICD-10-CM | POA: Insufficient documentation

## 2012-03-18 DIAGNOSIS — Z139 Encounter for screening, unspecified: Secondary | ICD-10-CM

## 2012-12-23 ENCOUNTER — Other Ambulatory Visit (HOSPITAL_COMMUNITY): Payer: Self-pay | Admitting: Internal Medicine

## 2012-12-23 DIAGNOSIS — T1490XA Injury, unspecified, initial encounter: Secondary | ICD-10-CM

## 2012-12-26 ENCOUNTER — Ambulatory Visit (HOSPITAL_COMMUNITY)
Admission: RE | Admit: 2012-12-26 | Discharge: 2012-12-26 | Disposition: A | Discharge status: Home / Self Care | Payer: BC Managed Care – PPO | Source: Ambulatory Visit | Attending: Internal Medicine | Admitting: Internal Medicine

## 2012-12-26 DIAGNOSIS — R51 Headache: Secondary | ICD-10-CM | POA: Insufficient documentation

## 2012-12-26 DIAGNOSIS — T1490XA Injury, unspecified, initial encounter: Secondary | ICD-10-CM

## 2013-03-16 ENCOUNTER — Other Ambulatory Visit (HOSPITAL_COMMUNITY): Payer: Self-pay | Admitting: Internal Medicine

## 2013-03-16 DIAGNOSIS — Z139 Encounter for screening, unspecified: Secondary | ICD-10-CM

## 2013-03-23 ENCOUNTER — Ambulatory Visit (HOSPITAL_COMMUNITY)
Admission: RE | Admit: 2013-03-23 | Discharge: 2013-03-23 | Disposition: A | Discharge status: Home / Self Care | Payer: BC Managed Care – PPO | Source: Ambulatory Visit | Attending: Internal Medicine | Admitting: Internal Medicine

## 2013-03-23 DIAGNOSIS — Z139 Encounter for screening, unspecified: Secondary | ICD-10-CM

## 2013-03-23 DIAGNOSIS — Z1231 Encounter for screening mammogram for malignant neoplasm of breast: Secondary | ICD-10-CM | POA: Insufficient documentation

## 2014-01-29 ENCOUNTER — Other Ambulatory Visit (HOSPITAL_COMMUNITY): Payer: Self-pay | Admitting: Internal Medicine

## 2014-01-29 DIAGNOSIS — Z1231 Encounter for screening mammogram for malignant neoplasm of breast: Secondary | ICD-10-CM

## 2014-03-26 ENCOUNTER — Ambulatory Visit (HOSPITAL_COMMUNITY)
Admission: RE | Admit: 2014-03-26 | Discharge: 2014-03-26 | Disposition: A | Discharge status: Home / Self Care | Payer: BC Managed Care – PPO | Source: Ambulatory Visit | Attending: Internal Medicine | Admitting: Internal Medicine

## 2014-03-26 DIAGNOSIS — Z1231 Encounter for screening mammogram for malignant neoplasm of breast: Secondary | ICD-10-CM | POA: Insufficient documentation

## 2015-02-07 ENCOUNTER — Ambulatory Visit (INDEPENDENT_AMBULATORY_CARE_PROVIDER_SITE_OTHER): Payer: BLUE CROSS/BLUE SHIELD | Admitting: Nurse Practitioner

## 2015-02-07 ENCOUNTER — Other Ambulatory Visit: Payer: Self-pay

## 2015-02-07 ENCOUNTER — Encounter: Payer: Self-pay | Admitting: Nurse Practitioner

## 2015-02-07 VITALS — BP 118/74 | HR 84 | Temp 98.0°F | Ht 66.0 in | Wt 200.6 lb

## 2015-02-07 DIAGNOSIS — R1310 Dysphagia, unspecified: Secondary | ICD-10-CM | POA: Insufficient documentation

## 2015-02-07 DIAGNOSIS — R1013 Epigastric pain: Secondary | ICD-10-CM

## 2015-02-07 MED ORDER — OMEPRAZOLE 20 MG PO CPDR: 20.0000 mg | DELAYED_RELEASE_CAPSULE | Freq: Every day | ORAL | Status: DC

## 2015-02-07 NOTE — Assessment & Plan Note (Signed)
Patient with significant dyspepsia symptoms as noted in history of present illness without a history of GERD. He started several weeks ago. Associated dysphagia symptoms as noted below area today we will start her on omeprazole 20 mg once daily and plan for endoscopy with dilation as noted below. Return for follow-up in 3 months.

## 2015-02-07 NOTE — Progress Notes (Addendum)
Primary Care Physician:  Wende Neighbors, MD Primary Gastroenterologist:  Dr. Gala Romney  Chief Complaint  Patient presents with  . Dysphagia    HPI:   59 year old female presents to our office for complaints of dysphagia. She has not been in our office since 2012. A side note, her colonoscopy is up-to-date last completed 03/31/2011 with recommended 5 year follow-up for multiple fragments of tubular adenoma, she'll be due in December 2017. No history of endoscopy found in our system. No history of GERD or upper GI issues noted.  Today she states she started having difficulty swallowing about 3 weeks ago. Has also been getting hoarse. Currently it happens at random. Will "get choked" on saliva. Also with difficulty swallowing solid food where she'll need to swallow 2-3 times to get it to pass. Denies regurgitation. No pill dysphagia. Overall states "it just doest feel right in there and I'm not sure what the problem is." Is having heartburn symptoms including epigastric pain, bloating, bitter taste in her mouth, acid regurgitation (once), and hoarseness. Has been seen by PCP and treated for possible sinus/URI issues which has not resolved. Antacids help somewhat with GERD symptoms. Denies hematochezia, melena. Denies chest pain, dyspnea, dizziness, lightheadedness, syncope, near syncope. Denies any other upper or lower GI symptoms.  Past Medical History  Diagnosis Date  . PE (pulmonary embolism)     ?birth control  . Headaches, cluster   . Seasonal allergies   . Insomnia   . Tobacco abuse   . Colitis, ischemic ([place]) 2003  . Hypertension   . Hypercholesterolemia   . Tubular adenoma 2004    colonoscopy 2004  . S/P colonoscopy [id number]    2004: tubular adeboma, 2007: hyperplastic polyps. Due 2012    Past Surgical History  Procedure Laterality Date  . Colonoscopy  09/17/05    20 cm polyp removed/bx/middescending colon polyp removed with snare  . Tubal ligation    . Breast biopsy      benign   . Colonoscopy  2003    tubular adenoma removed from cecum   . Colonoscopy  03/31/2011    Procedure: COLONOSCOPY;  Surgeon: Daneil Dolin, MD;  Location: [facility];  Service: Endoscopy;  Laterality: N/A;  10:30    Current Outpatient Prescriptions  Medication Sig Dispense Refill  . CRESTOR 5 MG tablet Take 5 mg by mouth daily.     Marland Kitchen UNABLE TO FIND Med Name: power thin phase II dietary supplement    . valsartan-hydrochlorothiazide (DIOVAN-HCT) 80-12.5 MG per tablet Take 1 tablet by mouth daily.       No current facility-administered medications for this visit.    Allergies as of 02/07/2015  . (No Known Allergies)    Family History  Problem Relation Age of Onset  . Colon cancer Neg Hx     Social History   Social History  . Marital Status: Married    Spouse Name: N/A  . Number of Children: N/A  . Years of Education: N/A   Occupational History  . [employer]    Social History Main Topics  . Smoking status: Current Every Day Smoker -- 1.00 packs/day for 30 years    Types: Cigarettes  . Smokeless tobacco: Not on file  . Alcohol Use: No  . Drug Use: No  . Sexual Activity: Not on file   Other Topics Concern  . Not on file   Social History Narrative    Review of Systems: General: Negative for anorexia, weight loss, fever,  chills, fatigue, weakness. Eyes: Negative for vision changes.  ENT: Negative for nasal congestion. CV: Negative for chest pain, angina, palpitations, peripheral edema.  Respiratory: Negative for dyspnea at rest, cough, sputum, wheezing.  GI: See history of present illness. MS: Negative for musculoskeletal pain.  Endo: Negative for unusual weight change.  Heme: Negative for bruising or bleeding. Allergy: Negative for rash or hives.    Physical Exam: BP 118/74 mmHg  Pulse 84  Temp(Src) 98 F (36.7 C) (Oral)  Ht 5\' 6"  (1.676 m)  Wt 200 lb 9.6 oz (90.992 kg)  BMI 32.39 kg/m2 General:   Alert and oriented. Pleasant and cooperative.  Well-nourished and well-developed.  Head:  Normocephalic and atraumatic. Eyes:  Without icterus, sclera clear and conjunctiva pink.  Ears:  Normal auditory acuity. Throat/Neck:  Supple, without mass or thyromegaly. Cardiovascular:  S1, S2 present without murmurs appreciated. Extremities without clubbing or edema. Respiratory:  Clear to auscultation bilaterally. No wheezes, rales, or rhonchi. No distress.  Gastrointestinal:  +BS, soft, and non-distended. Mild epigastric TTP. No HSM noted. No guarding or rebound. No masses appreciated.  Rectal:  Deferred  Neurologic:  Alert and oriented x4;  grossly normal neurologically. Psych:  Alert and cooperative. Normal mood and affect. Heme/Lymph/Immune: No excessive bruising noted.    02/07/2015 8:13 AM

## 2015-02-07 NOTE — Progress Notes (Signed)
CC'ED TO PCP 

## 2015-02-07 NOTE — Patient Instructions (Signed)
1. I send in a prescription to your pharmacy. Take omeprazole 20 mg, 30 minutes before your first meal the day. 2. We will schedule your procedure for you. 3. Return for follow-up in 3 months.

## 2015-02-07 NOTE — Assessment & Plan Note (Addendum)
Patient with dysphagia symptoms for the past approximately month. Also with confirmation GERD symptoms as noted below. She has dysphagia with solid foods and occasionally liquids and/or saliva, however no pill dysphagia noted. About the time of her dysphagia symptoms she is noted worsening GERD as noted below. It is possible that she has reflux esophagitis associated dysphagia, however cannot rule out stricture, web, or ring. Also with noted hoarseness. No throat swelling noted on exam. At this point we will treat her for GERD symptoms as noted below and plan for endoscopy. Return for follow-up in 3 months.  Proceed with EGD +/- Dilation with Dr. Gala Romney in near future: the risks, benefits, and alternatives have been discussed with the patient in detail. The patient states understanding and desires to proceed.  The patient is not on any anticoagulants, anxiolytics, chronic pain medications, or antidepressants. Denies alcohol and drug use. Her previous endoscopic procedure was completed under conscious sedation and this will likely be adequate for this procedure as well.

## 2015-02-15 ENCOUNTER — Encounter (HOSPITAL_COMMUNITY)
Admission: RE | Disposition: A | Discharge status: Home / Self Care | Payer: Self-pay | Source: Ambulatory Visit | Attending: Internal Medicine

## 2015-02-15 ENCOUNTER — Encounter (HOSPITAL_COMMUNITY): Payer: Self-pay | Admitting: *Deleted

## 2015-02-15 ENCOUNTER — Ambulatory Visit (HOSPITAL_COMMUNITY)
Admission: RE | Admit: 2015-02-15 | Discharge: 2015-02-15 | Disposition: A | Discharge status: Home / Self Care | Payer: BLUE CROSS/BLUE SHIELD | Source: Ambulatory Visit | Attending: Internal Medicine | Admitting: Internal Medicine

## 2015-02-15 DIAGNOSIS — K2100 Gastro-esophageal reflux disease with esophagitis, without bleeding: Secondary | ICD-10-CM | POA: Insufficient documentation

## 2015-02-15 DIAGNOSIS — Z86711 Personal history of pulmonary embolism: Secondary | ICD-10-CM | POA: Insufficient documentation

## 2015-02-15 DIAGNOSIS — Z79899 Other long term (current) drug therapy: Secondary | ICD-10-CM | POA: Diagnosis not present

## 2015-02-15 DIAGNOSIS — R1310 Dysphagia, unspecified: Secondary | ICD-10-CM | POA: Insufficient documentation

## 2015-02-15 DIAGNOSIS — R1013 Epigastric pain: Secondary | ICD-10-CM

## 2015-02-15 DIAGNOSIS — I10 Essential (primary) hypertension: Secondary | ICD-10-CM | POA: Insufficient documentation

## 2015-02-15 DIAGNOSIS — F17210 Nicotine dependence, cigarettes, uncomplicated: Secondary | ICD-10-CM | POA: Diagnosis not present

## 2015-02-15 DIAGNOSIS — K2210 Ulcer of esophagus without bleeding: Secondary | ICD-10-CM | POA: Diagnosis not present

## 2015-02-15 DIAGNOSIS — E7800 Pure hypercholesterolemia, unspecified: Secondary | ICD-10-CM | POA: Diagnosis not present

## 2015-02-15 HISTORY — PX: ESOPHAGOGASTRODUODENOSCOPY: SHX5428

## 2015-02-15 HISTORY — PX: MALONEY DILATION: SHX5535

## 2015-02-15 SURGERY — EGD (ESOPHAGOGASTRODUODENOSCOPY)
Anesthesia: Moderate Sedation

## 2015-02-15 MED ORDER — ONDANSETRON HCL 4 MG/2ML IJ SOLN
INTRAMUSCULAR | Status: DC | PRN
  Administered 2015-02-15: 4 mg via INTRAVENOUS

## 2015-02-15 MED ORDER — STERILE WATER FOR IRRIGATION IR SOLN
Status: DC | PRN
  Administered 2015-02-15: 08:00:00

## 2015-02-15 MED ORDER — LIDOCAINE VISCOUS 2 % MT SOLN
OROMUCOSAL | Status: DC | PRN
  Administered 2015-02-15: 6 mL via OROMUCOSAL

## 2015-02-15 MED ORDER — MIDAZOLAM HCL 5 MG/5ML IJ SOLN
INTRAMUSCULAR | Status: DC | PRN
  Administered 2015-02-15: 1 mg via INTRAVENOUS
  Administered 2015-02-15: 2 mg via INTRAVENOUS
  Administered 2015-02-15: 1 mg via INTRAVENOUS

## 2015-02-15 MED ORDER — LIDOCAINE VISCOUS 2 % MT SOLN
OROMUCOSAL | Status: AC
  Filled 2015-02-15: qty 15

## 2015-02-15 MED ORDER — MIDAZOLAM HCL 5 MG/5ML IJ SOLN
INTRAMUSCULAR | Status: AC
  Filled 2015-02-15: qty 10

## 2015-02-15 MED ORDER — ONDANSETRON HCL 4 MG/2ML IJ SOLN
INTRAMUSCULAR | Status: AC
  Filled 2015-02-15: qty 2

## 2015-02-15 MED ORDER — SODIUM CHLORIDE 0.9 % IV SOLN
INTRAVENOUS | Status: DC
  Administered 2015-02-15: 07:00:00 via INTRAVENOUS

## 2015-02-15 MED ORDER — MEPERIDINE HCL 100 MG/ML IJ SOLN
INTRAMUSCULAR | Status: DC | PRN
  Administered 2015-02-15: 50 mg via INTRAVENOUS

## 2015-02-15 MED ORDER — MEPERIDINE HCL 100 MG/ML IJ SOLN
INTRAMUSCULAR | Status: AC
  Filled 2015-02-15: qty 2

## 2015-02-15 NOTE — Discharge Instructions (Addendum)
EGD Discharge instructions Please read the instructions outlined below and refer to this sheet in the next few weeks. These discharge instructions provide you with general information on caring for yourself after you leave the hospital. Your doctor may also give you specific instructions. While your treatment has been planned according to the most current medical practices available, unavoidable complications occasionally occur. If you have any problems or questions after discharge, please call your doctor. ACTIVITY  You may resume your regular activity but move at a slower pace for the next 24 hours.   Take frequent rest periods for the next 24 hours.   Walking will help expel (get rid of) the air and reduce the bloated feeling in your abdomen.   No driving for 24 hours (because of the anesthesia (medicine) used during the test).   You may shower.   Do not sign any important legal documents or operate any machinery for 24 hours (because of the anesthesia used during the test).  NUTRITION  Drink plenty of fluids.   You may resume your normal diet.   Begin with a light meal and progress to your normal diet.   Avoid alcoholic beverages for 24 hours or as instructed by your caregiver.  MEDICATIONS  You may resume your normal medications unless your caregiver tells you otherwise.  WHAT YOU CAN EXPECT TODAY  You may experience abdominal discomfort such as a feeling of fullness or gas pains.  FOLLOW-UP  Your doctor will discuss the results of your test with you.  SEEK IMMEDIATE MEDICAL ATTENTION IF ANY OF THE FOLLOWING OCCUR:  Excessive nausea (feeling sick to your stomach) and/or vomiting.   Severe abdominal pain and distention (swelling).   Trouble swallowing.   Temperature over 101 F (37.8 C).   Rectal bleeding or vomiting of blood.    GERD information provided  Continue omeprazole 20 mg daily  Office visit with [place] in 3 months  Gastroesophageal Reflux Disease,  Adult Normally, food travels down the esophagus and stays in the stomach to be digested. However, when a person has gastroesophageal reflux disease (GERD), food and stomach acid move back up into the esophagus. When this happens, the esophagus becomes sore and inflamed. Over time, GERD can create small holes (ulcers) in the lining of the esophagus.  CAUSES This condition is caused by a problem with the muscle between the esophagus and the stomach (lower esophageal sphincter, or LES). Normally, the LES muscle closes after food passes through the esophagus to the stomach. When the LES is weakened or abnormal, it does not close properly, and that allows food and stomach acid to go back up into the esophagus. The LES can be weakened by certain dietary substances, medicines, and medical conditions, including:  Tobacco use.  Pregnancy.  Having a hiatal hernia.  Heavy alcohol use.  Certain foods and beverages, such as coffee, chocolate, onions, and peppermint. RISK FACTORS This condition is more likely to develop in:  People who have an increased body weight.  People who have connective tissue disorders.  People who use NSAID medicines. SYMPTOMS Symptoms of this condition include:  Heartburn.  Difficult or painful swallowing.  The feeling of having a lump in the throat.  Abitter taste in the mouth.  Bad breath.  Having a large amount of saliva.  Having an upset or bloated stomach.  Belching.  Chest pain.  Shortness of breath or wheezing.  Ongoing (chronic) cough or a night-time cough.  Wearing away of tooth enamel.  Weight loss.  Different conditions can cause chest pain. Make sure to see your health care provider if you experience chest pain. DIAGNOSIS Your health care provider will take a medical history and perform a physical exam. To determine if you have mild or severe GERD, your health care provider may also monitor how you respond to treatment. You may also have  other tests, including:  An endoscopy toexamine your stomach and esophagus with a small camera.  A test thatmeasures the acidity level in your esophagus.  A test thatmeasures how much pressure is on your esophagus.  A barium swallow or modified barium swallow to show the shape, size, and functioning of your esophagus. TREATMENT The goal of treatment is to help relieve your symptoms and to prevent complications. Treatment for this condition may vary depending on how severe your symptoms are. Your health care provider may recommend:  Changes to your diet.  Medicine.  Surgery. HOME CARE INSTRUCTIONS Diet  Follow a diet as recommended by your health care provider. This may involve avoiding foods and drinks such as:  Coffee and tea (with or without caffeine).  Drinks that containalcohol.  Energy drinks and sports drinks.  Carbonated drinks or sodas.  Chocolate and cocoa.  Peppermint and mint flavorings.  Garlic and onions.  Horseradish.  Spicy and acidic foods, including peppers, chili powder, curry powder, vinegar, hot sauces, and barbecue sauce.  Citrus fruit juices and citrus fruits, such as oranges, lemons, and limes.  Tomato-based foods, such as red sauce, chili, salsa, and pizza with red sauce.  Fried and fatty foods, such as donuts, french fries, potato chips, and high-fat dressings.  High-fat meats, such as hot dogs and fatty cuts of red and white meats, such as rib eye steak, sausage, ham, and bacon.  High-fat dairy items, such as whole milk, butter, and cream cheese.  Eat small, frequent meals instead of large meals.  Avoid drinking large amounts of liquid with your meals.  Avoid eating meals during the 2-3 hours before bedtime.  Avoid lying down right after you eat.  Do not exercise right after you eat. General Instructions  Pay attention to any changes in your symptoms.  Take over-the-counter and prescription medicines only as told by your  health care provider. Do not take aspirin, ibuprofen, or other NSAIDs unless your health care provider told you to do so.  Do not use any tobacco products, including cigarettes, chewing tobacco, and e-cigarettes. If you need help quitting, ask your health care provider.  Wear loose-fitting clothing. Do not wear anything tight around your waist that causes pressure on your abdomen.  Raise (elevate) the head of your bed 6 inches (15cm).  Try to reduce your stress, such as with yoga or meditation. If you need help reducing stress, ask your health care provider.  If you are overweight, reduce your weight to an amount that is healthy for you. Ask your health care provider for guidance about a safe weight loss goal.  Keep all follow-up visits as told by your health care provider. This is important. SEEK MEDICAL CARE IF:  You have new symptoms.  You have unexplained weight loss.  You have difficulty swallowing, or it hurts to swallow.  You have wheezing or a persistent cough.  Your symptoms do not improve with treatment.  You have a hoarse voice. SEEK IMMEDIATE MEDICAL CARE IF:  You have pain in your arms, neck, jaw, teeth, or back.  You feel sweaty, dizzy, or light-headed.  You have chest pain or shortness  of breath.  You vomit and your vomit looks like blood or coffee grounds.  You faint.  Your stool is bloody or black.  You cannot swallow, drink, or eat.   This information is not intended to replace advice given to you by your health care provider. Make sure you discuss any questions you have with your health care provider.   Document Released: 01/07/2005 Document Revised: 12/19/2014 Document Reviewed: 07/25/2014 Elsevier Interactive Patient Education [employer].

## 2015-02-15 NOTE — H&P (View-Only) (Signed)
Primary Care Physician:  Wende Neighbors, MD Primary Gastroenterologist:  Dr. Gala Romney  Chief Complaint  Patient presents with  . Dysphagia    HPI:   59 year old female presents to our office for complaints of dysphagia. She has not been in our office since 2012. A side note, her colonoscopy is up-to-date last completed 03/31/2011 with recommended 5 year follow-up for multiple fragments of tubular adenoma, she'll be due in December 2017. No history of endoscopy found in our system. No history of GERD or upper GI issues noted.  Today she states she started having difficulty swallowing about 3 weeks ago. Has also been getting hoarse. Currently it happens at random. Will "get choked" on saliva. Also with difficulty swallowing solid food where she'll need to swallow 2-3 times to get it to pass. Denies regurgitation. No pill dysphagia. Overall states "it just doest feel right in there and I'm not sure what the problem is." Is having heartburn symptoms including epigastric pain, bloating, bitter taste in her mouth, acid regurgitation (once), and hoarseness. Has been seen by PCP and treated for possible sinus/URI issues which has not resolved. Antacids help somewhat with GERD symptoms. Denies hematochezia, melena. Denies chest pain, dyspnea, dizziness, lightheadedness, syncope, near syncope. Denies any other upper or lower GI symptoms.  Past Medical History  Diagnosis Date  . PE (pulmonary embolism)     ?birth control  . Headaches, cluster   . Seasonal allergies   . Insomnia   . Tobacco abuse   . Colitis, ischemic ([place]) 2003  . Hypertension   . Hypercholesterolemia   . Tubular adenoma 2004    colonoscopy 2004  . S/P colonoscopy [id number],[id number]    2004: tubular adeboma, 2007: hyperplastic polyps. Due 2012    Past Surgical History  Procedure Laterality Date  . Colonoscopy  09/17/05    20 cm polyp removed/bx/middescending colon polyp removed with snare  . Tubal ligation    . Breast biopsy      benign   . Colonoscopy  2003    tubular adenoma removed from cecum   . Colonoscopy  03/31/2011    Procedure: COLONOSCOPY;  Surgeon: Daneil Dolin, MD;  [facility];  Service: Endoscopy;  Laterality: N/A;  10:30    Current Outpatient Prescriptions  Medication Sig Dispense Refill  . CRESTOR 5 MG tablet Take 5 mg by mouth daily.     Marland Kitchen UNABLE TO FIND Med Name: power thin phase II dietary supplement    . valsartan-hydrochlorothiazide (DIOVAN-HCT) 80-12.5 MG per tablet Take 1 tablet by mouth daily.       No current facility-administered medications for this visit.    Allergies as of 02/07/2015  . (No Known Allergies)    Family History  Problem Relation Age of Onset  . Colon cancer Neg Hx     Social History   Social History  . Marital Status: Married    Spouse Name: N/A  . Number of Children: N/A  . Years of Education: N/A   Occupational History  . [employer]    Social History Main Topics  . Smoking status: Current Every Day Smoker -- 1.00 packs/day for 30 years    Types: Cigarettes  . Smokeless tobacco: Not on file  . Alcohol Use: No  . Drug Use: No  . Sexual Activity: Not on file   Other Topics Concern  . Not on file   Social History Narrative    Review of Systems: General: Negative for anorexia, weight loss, fever,  chills, fatigue, weakness. Eyes: Negative for vision changes.  ENT: Negative for nasal congestion. CV: Negative for chest pain, angina, palpitations, peripheral edema.  Respiratory: Negative for dyspnea at rest, cough, sputum, wheezing.  GI: See history of present illness. MS: Negative for musculoskeletal pain.  Endo: Negative for unusual weight change.  Heme: Negative for bruising or bleeding. Allergy: Negative for rash or hives.    Physical Exam: BP 118/74 mmHg  Pulse 84  Temp(Src) 98 F (36.7 C) (Oral)  Ht 5\' 6"  (1.676 m)  Wt 200 lb 9.6 oz (90.992 kg)  BMI 32.39 kg/m2 General:   Alert and oriented. Pleasant and cooperative.  Well-nourished and well-developed.  Head:  Normocephalic and atraumatic. Eyes:  Without icterus, sclera clear and conjunctiva pink.  Ears:  Normal auditory acuity. Throat/Neck:  Supple, without mass or thyromegaly. Cardiovascular:  S1, S2 present without murmurs appreciated. Extremities without clubbing or edema. Respiratory:  Clear to auscultation bilaterally. No wheezes, rales, or rhonchi. No distress.  Gastrointestinal:  +BS, soft, and non-distended. Mild epigastric TTP. No HSM noted. No guarding or rebound. No masses appreciated.  Rectal:  Deferred  Neurologic:  Alert and oriented x4;  grossly normal neurologically. Psych:  Alert and cooperative. Normal mood and affect. Heme/Lymph/Immune: No excessive bruising noted.    02/07/2015 8:13 AM

## 2015-02-15 NOTE — Interval H&P Note (Signed)
History and Physical Interval Note:  02/15/2015 8:04 AM  Joan [name]  has presented today for surgery, with the diagnosis of dysphagia, dyspepsia  The various methods of treatment have been discussed with the patient and family. After consideration of risks, benefits and other options for treatment, the patient has consented to  Procedure(s) with comments: ESOPHAGOGASTRODUODENOSCOPY (EGD) (N/A) - 1415 - moved to 8:00 - office notified pt MALONEY DILATION (N/A) as a surgical intervention .  The patient's history has been reviewed, patient examined, no change in status, stable for surgery.  I have reviewed the patient's chart and labs.  Questions were answered to the patient's satisfaction.     Manus Rudd  Prilosec has helped [place] with some of her dyspepsia symptoms. The risks, benefits, limitations, alternatives and imponderables have been reviewed with the patient. Potential for esophageal dilation, biopsy, etc. have also been reviewed.  Questions have been answered. All parties agreeable.

## 2015-02-15 NOTE — Op Note (Signed)
[place], [address]   ENDOSCOPY PROCEDURE REPORT  PATIENT: Joan [name], Joan [name]  MR#: [id number] BIRTHDATE: 1956/02/21 , 8  yrs. old GENDER: female ENDOSCOPIST: R.  Garfield Cornea, MD FACP FACG REFERRED BY:  Delphina Cahill, M.D. PROCEDURE DATE:  21-Feb-2015 PROCEDURE:  EGD, diagnostic and Maloney dilation of esophagus INDICATIONS:  dyspepsia; esophageal dysphagia. MEDICATIONS: Versed 4 mg IV and Demerol 50 mg IV in divided doses. Xylocaine gel orally.  Zofran 4 mg IV. ASA CLASS:      Class II  CONSENT: The risks, benefits, limitations, alternatives and imponderables have been discussed.  The potential for biopsy, esophogeal dilation, etc. have also been reviewed.  Questions have been answered.  All parties agreeable.  Please see the history and physical in the medical record for more information.  DESCRIPTION OF PROCEDURE: After the risks benefits and alternatives of the procedure were thoroughly explained, informed consent was obtained.  The EG-[id number] ([id number]) endoscope was introduced through the mouth and advanced to the second portion of the duodenum , limited by Without limitations. The instrument was slowly withdrawn as the mucosa was fully examined. Estimated blood loss is zero unless otherwise noted in this procedure report. 2   Circumferential distal esophageal erosions within 5 mm of the GE junction.  Tubular esophagus patent throughout its course.  No Barrett's epithelium seen.  Stomach empty.  Normal-appearing gastric mucosa.  No hiatal hernia.  Patent pylorus. Normal-appearing first and second portion of the duodenum.  Scope was withdrawn and a 54 [place] Maloney dilator was passed easily.  A look back reveal no apparent complications related to this maneuver.  Retroflexed views revealed no abnormalities. The scope was then withdrawn from the patient and the procedure completed.  COMPLICATIONS: There were no immediate  complications.  ENDOSCOPIC IMPRESSION: Mild erosive reflux esophagitis. Otherwise normal EGD. Status post passage of a Maloney dilator  RECOMMENDATIONS: Patient reports symptoms already improved with brief course of omeprazole 20 mg daily. Patient is to continue omeprazole 20 mg daily. Information on treatment of GERD including lifestyle modification provided. Office visit with [place] in 3 months.  REPEAT EXAM:  eSigned:  R. Garfield Cornea, MD Rosalita Chessman [facility] 02-21-15 8:36 AM    CC:  CPT CODES: ICD CODES:  The ICD and CPT codes recommended by this software are interpretations from the data that the clinical staff has captured with the software.  The verification of the translation of this report to the ICD and CPT codes and modifiers is the sole responsibility of the health care institution and practicing physician where this report was generated.  [place]. will not be held responsible for the validity of the ICD and CPT codes included on this report.  AMA assumes no liability for data contained or not contained herein. CPT is a Designer, television/film set of the [employer].  PATIENT NAME:  [name], [name] MR#: [id number]

## 2015-02-20 ENCOUNTER — Encounter (HOSPITAL_COMMUNITY): Payer: Self-pay | Admitting: Internal Medicine

## 2015-02-26 ENCOUNTER — Other Ambulatory Visit (HOSPITAL_COMMUNITY): Payer: Self-pay | Admitting: Internal Medicine

## 2015-02-26 DIAGNOSIS — Z1231 Encounter for screening mammogram for malignant neoplasm of breast: Secondary | ICD-10-CM

## 2015-03-26 ENCOUNTER — Other Ambulatory Visit: Payer: Self-pay

## 2015-03-26 DIAGNOSIS — R1013 Epigastric pain: Secondary | ICD-10-CM

## 2015-03-26 DIAGNOSIS — R1310 Dysphagia, unspecified: Secondary | ICD-10-CM

## 2015-03-26 NOTE — Telephone Encounter (Signed)
Pt requesting #[phone] instead of #30

## 2015-03-27 MED ORDER — OMEPRAZOLE 20 MG PO CPDR: 20.0000 mg | DELAYED_RELEASE_CAPSULE | Freq: Every day | ORAL | Status: DC

## 2015-03-29 ENCOUNTER — Ambulatory Visit (HOSPITAL_COMMUNITY)
Admission: RE | Admit: 2015-03-29 | Discharge: 2015-03-29 | Disposition: A | Discharge status: Home / Self Care | Payer: BLUE CROSS/BLUE SHIELD | Source: Ambulatory Visit | Attending: Internal Medicine | Admitting: Internal Medicine

## 2015-03-29 DIAGNOSIS — Z1231 Encounter for screening mammogram for malignant neoplasm of breast: Secondary | ICD-10-CM | POA: Diagnosis present

## 2015-05-10 ENCOUNTER — Ambulatory Visit: Payer: BLUE CROSS/BLUE SHIELD | Admitting: Nurse Practitioner

## 2015-10-01 ENCOUNTER — Encounter: Payer: Self-pay | Admitting: Gastroenterology

## 2015-10-01 ENCOUNTER — Ambulatory Visit (INDEPENDENT_AMBULATORY_CARE_PROVIDER_SITE_OTHER): Payer: BLUE CROSS/BLUE SHIELD | Admitting: Gastroenterology

## 2015-10-01 ENCOUNTER — Other Ambulatory Visit: Payer: Self-pay

## 2015-10-01 VITALS — BP 114/72 | HR 84 | Temp 97.0°F | Ht 66.0 in | Wt 198.8 lb

## 2015-10-01 DIAGNOSIS — Z86010 Personal history of colonic polyps: Secondary | ICD-10-CM

## 2015-10-01 DIAGNOSIS — R1310 Dysphagia, unspecified: Secondary | ICD-10-CM

## 2015-10-01 DIAGNOSIS — R194 Change in bowel habit: Secondary | ICD-10-CM | POA: Diagnosis not present

## 2015-10-01 DIAGNOSIS — R1013 Epigastric pain: Secondary | ICD-10-CM | POA: Diagnosis not present

## 2015-10-01 DIAGNOSIS — R198 Other specified symptoms and signs involving the digestive system and abdomen: Secondary | ICD-10-CM

## 2015-10-01 DIAGNOSIS — Z860101 Personal history of adenomatous and serrated colon polyps: Secondary | ICD-10-CM | POA: Insufficient documentation

## 2015-10-01 MED ORDER — PEG-KCL-NACL-NASULF-NA ASC-C 100 G PO SOLR: 1.0000 | ORAL | Status: DC

## 2015-10-01 MED ORDER — OMEPRAZOLE 20 MG PO CPDR: 20.0000 mg | DELAYED_RELEASE_CAPSULE | Freq: Every day | ORAL | Status: DC

## 2015-10-01 NOTE — [facility] Notes (Signed)
Cc'ed to pcp °

## 2015-10-01 NOTE — [facility] Notes (Signed)
Primary Care Physician:  Wende Neighbors, MD  Primary Gastroenterologist:  Garfield Cornea, MD   Chief Complaint  Patient presents with  . Follow-up  . Dysphagia  . set up TCS    HPI:  Joan [name] is a 60 y.o. female here Requesting surveillance colonoscopy for history of adenomatous colon polyps. Last colonoscopy December 2012. Multiple adenomatous colon polyps removed. She does have some change in bowel habits more recently. May go a few days but a bowel movement and then sometimes stools runny. No regularity. Denies melena rectal bleeding. No significant abdominal pain. Reflux and swallowing much improved on omeprazole 20 mg daily. No vomiting or unintentional weight loss.  She admits to being under a lot of stress, her company is closing down June 30. Currently she does not have a job to transition to.  Current Outpatient Prescriptions  Medication Sig Dispense Refill  . CRESTOR 5 MG tablet Take 5 mg by mouth daily.     Marland Kitchen omeprazole (PRILOSEC) 20 MG capsule Take 1 capsule (20 mg total) by mouth daily. 90 capsule 3  . valsartan-hydrochlorothiazide (DIOVAN-HCT) 80-12.5 MG per tablet Take 1 tablet by mouth daily.       No current facility-administered medications for this visit.    Allergies as of 10/01/2015  . (No Known Allergies)    Past Medical History  Diagnosis Date  . PE (pulmonary embolism)     ?birth control  . Headaches, cluster   . Seasonal allergies   . Insomnia   . Tobacco abuse   . Colitis, ischemic ([place]) 2003  . Hypertension   . Hypercholesterolemia   . Tubular adenoma 2004    colonoscopy 2004  . S/P colonoscopy [id number]    2004: tubular adeboma, 2007: hyperplastic polyps. Due 2012    Past Surgical History  Procedure Laterality Date  . Colonoscopy  09/17/05    20 cm polyp removed/bx/middescending colon polyp removed with snare  . Tubal ligation    . Breast biopsy      benign  . Colonoscopy  2003    tubular adenoma removed from cecum   . Colonoscopy   03/31/2011    RMR, diverticulosis, multiple colon polyps removed (tubular adenomas). next TCS 03/2016  . Esophagogastroduodenoscopy N/A 02/15/2015    RMR, mild reflux esophagitis. empirical dilation of esophagus.   Venia Minks dilation N/A 02/15/2015    Procedure: Venia Minks DILATION;  Surgeon: Daneil Dolin, MD;  Location[facility];  Service: Endoscopy;  Laterality: N/A;    Family History  Problem Relation Age of Onset  . Colon cancer Neg Hx     Social History   Social History  . Marital Status: Married    Spouse Name: N/A  . Number of Children: N/A  . Years of Education: N/A   Occupational History  . [employer]    Social History Main Topics  . Smoking status: Current Every Day Smoker -- 1.00 packs/day for 30 years    Types: Cigarettes  . Smokeless tobacco: Never Used     Comment: Is about to quit because work is disallowing smoking at the site  . Alcohol Use: No  . Drug Use: No  . Sexual Activity: Not on file   Other Topics Concern  . Not on file   Social History Narrative      ROS:  General: Negative for anorexia, weight loss, fever, chills, fatigue, weakness. Eyes: Negative for vision changes.  ENT: Negative for hoarseness, difficulty swallowing , nasal congestion. CV: Negative for  chest pain, angina, palpitations, dyspnea on exertion, peripheral edema.  Respiratory: Negative for dyspnea at rest, dyspnea on exertion, cough, sputum, wheezing.  GI: See history of present illness. GU:  Negative for dysuria, hematuria, urinary incontinence, urinary frequency, nocturnal urination.  MS: Negative for joint pain, low back pain.  Derm: Negative for rash or itching.  Neuro: Negative for weakness, abnormal sensation, seizure, frequent headaches, memory loss, confusion.  Psych: Negative for anxiety, depression, suicidal ideation, hallucinations.  Endo: Negative for unusual weight change.  Heme: Negative for bruising or bleeding. Allergy: Negative for rash or hives.     Physical Examination:  BP 114/72 mmHg  Pulse 84  Temp(Src) 97 F (36.1 C)  Ht 5\' 6"  (1.676 m)  Wt 198 lb 12.8 oz (90.175 kg)  BMI 32.10 kg/m2   General: Well-nourished, well-developed in no acute distress.  Head: Normocephalic, atraumatic.   Eyes: Conjunctiva pink, no icterus. Mouth: Oropharyngeal mucosa moist and pink , no lesions erythema or exudate. Neck: Supple without thyromegaly, masses, or lymphadenopathy.  Lungs: Clear to auscultation bilaterally.  Heart: Regular rate and rhythm, no murmurs rubs or gallops.  Abdomen: Bowel sounds are normal, nontender, nondistended, no hepatosplenomegaly or masses, no abdominal bruits or    hernia , no rebound or guarding.   Rectal: not performed Extremities: No lower extremity edema. No clubbing or deformities.  Neuro: Alert and oriented x 4 , grossly normal neurologically.  Skin: Warm and dry, no rash or jaundice.   Psych: Alert and cooperative, normal mood and affect.

## 2015-10-01 NOTE — Assessment & Plan Note (Addendum)
60 year old female with history of multiple adenomatous colon polyps, due for surveillance colonoscopy who presents with mild to moderate change of bowel habits. Fluctuates between loose stools and hard stools. Symptoms worse with increase stress related to upcoming loss of job. No melena rectal bleeding. She notes her upper GI symptoms are resolved on omeprazole 20 mg daily. Recommend colonoscopy at this time.  I have discussed the risks, alternatives, benefits with regards to but not limited to the risk of reaction to medication, bleeding, infection, perforation and the patient is agreeable to proceed. Written consent to be obtained.  Utilize MiraLAX 1 capful at bedtime on days without adequate bowel function. Continue omeprazole 20 mg daily, Rx provided.

## 2015-10-01 NOTE — Patient Instructions (Signed)
1. Prescription for omeprazole sent to your pharmacy. 2. Consider utilizing MiraLAX 1 capful at bedtime on days you do not have adequate bowel movement. You can buy over-the-counter. 3. Colonoscopy as scheduled. Please see separate instructions.

## 2015-10-22 ENCOUNTER — Encounter (HOSPITAL_COMMUNITY): Payer: Self-pay | Admitting: *Deleted

## 2015-10-22 ENCOUNTER — Ambulatory Visit (HOSPITAL_COMMUNITY)
Admission: RE | Admit: 2015-10-22 | Discharge: 2015-10-22 | Disposition: A | Discharge status: Home / Self Care | Payer: BLUE CROSS/BLUE SHIELD | Source: Ambulatory Visit | Attending: Internal Medicine | Admitting: Internal Medicine

## 2015-10-22 ENCOUNTER — Encounter (HOSPITAL_COMMUNITY)
Admission: RE | Disposition: A | Discharge status: Home / Self Care | Payer: Self-pay | Source: Ambulatory Visit | Attending: Internal Medicine

## 2015-10-22 DIAGNOSIS — K621 Rectal polyp: Secondary | ICD-10-CM | POA: Insufficient documentation

## 2015-10-22 DIAGNOSIS — Z1211 Encounter for screening for malignant neoplasm of colon: Secondary | ICD-10-CM | POA: Diagnosis present

## 2015-10-22 DIAGNOSIS — R198 Other specified symptoms and signs involving the digestive system and abdomen: Secondary | ICD-10-CM

## 2015-10-22 DIAGNOSIS — R51 Headache: Secondary | ICD-10-CM | POA: Diagnosis not present

## 2015-10-22 DIAGNOSIS — Z79899 Other long term (current) drug therapy: Secondary | ICD-10-CM | POA: Diagnosis not present

## 2015-10-22 DIAGNOSIS — E7800 Pure hypercholesterolemia, unspecified: Secondary | ICD-10-CM | POA: Diagnosis not present

## 2015-10-22 DIAGNOSIS — R1310 Dysphagia, unspecified: Secondary | ICD-10-CM | POA: Diagnosis not present

## 2015-10-22 DIAGNOSIS — F17210 Nicotine dependence, cigarettes, uncomplicated: Secondary | ICD-10-CM | POA: Insufficient documentation

## 2015-10-22 DIAGNOSIS — G4700 Insomnia, unspecified: Secondary | ICD-10-CM | POA: Diagnosis not present

## 2015-10-22 DIAGNOSIS — I10 Essential (primary) hypertension: Secondary | ICD-10-CM | POA: Diagnosis not present

## 2015-10-22 DIAGNOSIS — D124 Benign neoplasm of descending colon: Secondary | ICD-10-CM | POA: Insufficient documentation

## 2015-10-22 DIAGNOSIS — Z86010 Personal history of colonic polyps: Secondary | ICD-10-CM | POA: Insufficient documentation

## 2015-10-22 DIAGNOSIS — K635 Polyp of colon: Secondary | ICD-10-CM

## 2015-10-22 DIAGNOSIS — Z86711 Personal history of pulmonary embolism: Secondary | ICD-10-CM | POA: Insufficient documentation

## 2015-10-22 DIAGNOSIS — K210 Gastro-esophageal reflux disease with esophagitis: Secondary | ICD-10-CM | POA: Diagnosis not present

## 2015-10-22 HISTORY — PX: COLONOSCOPY: SHX5424

## 2015-10-22 SURGERY — COLONOSCOPY
Anesthesia: Moderate Sedation

## 2015-10-22 MED ORDER — MEPERIDINE HCL 50 MG/ML IJ SOLN
INTRAMUSCULAR | Status: AC
  Filled 2015-10-22: qty 1

## 2015-10-22 MED ORDER — MEPERIDINE HCL 100 MG/ML IJ SOLN
INTRAMUSCULAR | Status: DC | PRN
  Administered 2015-10-22: 50 mg via INTRAVENOUS
  Administered 2015-10-22: 25 mg via INTRAVENOUS

## 2015-10-22 MED ORDER — SODIUM CHLORIDE 0.9 % IV SOLN
INTRAVENOUS | Status: DC
  Administered 2015-10-22: 07:00:00 via INTRAVENOUS

## 2015-10-22 MED ORDER — MEPERIDINE HCL 100 MG/ML IJ SOLN
INTRAMUSCULAR | Status: AC
  Filled 2015-10-22: qty 2

## 2015-10-22 MED ORDER — MIDAZOLAM HCL 5 MG/5ML IJ SOLN
INTRAMUSCULAR | Status: AC
  Filled 2015-10-22: qty 10

## 2015-10-22 MED ORDER — STERILE WATER FOR IRRIGATION IR SOLN
Status: DC | PRN
  Administered 2015-10-22: 07:00:00

## 2015-10-22 MED ORDER — MIDAZOLAM HCL 5 MG/5ML IJ SOLN
INTRAMUSCULAR | Status: DC | PRN
  Administered 2015-10-22: 2 mg via INTRAVENOUS
  Administered 2015-10-22: 1 mg via INTRAVENOUS

## 2015-10-22 MED ORDER — ONDANSETRON HCL 4 MG/2ML IJ SOLN
INTRAMUSCULAR | Status: AC
  Filled 2015-10-22: qty 2

## 2015-10-22 MED ORDER — ONDANSETRON HCL 4 MG/2ML IJ SOLN
INTRAMUSCULAR | Status: DC | PRN
  Administered 2015-10-22: 4 mg via INTRAVENOUS

## 2015-10-22 NOTE — Op Note (Signed)
[facility] Patient Name: Joan [name] Procedure Date: 10/22/2015 7:22 AM MRN: [id number] Date of Birth: 02/28/1956 Attending MD: Norvel Richards , MD CSN: [id number] Age: 60 Admit Type: Outpatient Procedure:                Colonoscopy with snare polypectomy and ablation Indications:              High risk colon cancer surveillance: Personal                            history of colonic polyps Providers:                Norvel Richards, MD, Lurline Del, RN, Randa Spike, Technician Referring MD:              Medicines:                Midazolam 4 mg IV, Meperidine 75 mg IV, Ondansetron                            4 mg IV Complications:            No immediate complications. Estimated Blood Loss:     Estimated blood loss was minimal. Procedure:                Pre-Anesthesia Assessment:                           - Prior to the procedure, a History and Physical                            was performed, and patient medications and                            allergies were reviewed. The patient's tolerance of                            previous anesthesia was also reviewed. The risks                            and benefits of the procedure and the sedation                            options and risks were discussed with the patient.                            All questions were answered, and informed consent                            was obtained. Prior Anticoagulants: The patient has                            taken no previous anticoagulant or antiplatelet  agents. ASA Grade Assessment: II - A patient with                            mild systemic disease. After reviewing the risks                            and benefits, the patient was deemed in                            satisfactory condition to undergo the procedure.                           After obtaining informed consent, the colonoscope       was passed under direct vision. Throughout the                            procedure, the patient's blood pressure, pulse, and                            oxygen saturations were monitored continuously. The                            [id number]) scope was introduced through                            the anus and advanced to the the cecum, identified                            by appendiceal orifice and ileocecal valve. The                            colonoscopy was performed without difficulty. The                            patient tolerated the procedure well. The ileocecal                            valve, appendiceal orifice, and rectum were                            photographed. The quality of the bowel preparation                            was adequate. Scope In: 7:43:58 AM Scope Out: 8:00:34 AM Scope Withdrawal Time: 0 hours 11 minutes 57 seconds  Total Procedure Duration: 0 hours 16 minutes 36 seconds  Findings:      The perianal and digital rectal examinations were normal.      A 5 mm polyp was found in the descending colon. The polyp was       semi-pedunculated. The polyp was removed with a cold snare. Resection       and retrieval were complete. Estimated blood loss was minimal.      A 5 mm polyp was found in the rectum. The polyp was semi-pedunculated.  The polyp was removed with a cold snare. Resection and retrieval were       complete. Estimated blood loss was minimal. multiple diminutive polyps       in the rectum ablated with the tip of the hot snare loop. Impression:               - One 5 mm polyp in the descending colon, removed                            with a cold snare. Resected and retrieved.                           - One 5 mm polyp in the rectum, removed with a cold                            snare. Resected and retrieved. Rectal polyps                            ablated. Moderate Sedation:      Moderate (conscious) sedation was administered  by the endoscopy nurse       and supervised by the endoscopist. The following parameters were       monitored: oxygen saturation, heart rate, blood pressure, respiratory       rate, EKG, adequacy of pulmonary ventilation, and response to care.       Total physician intraservice time was 23 minutes. Recommendation:           - Repeat colonoscopy date to be determined after                            pending pathology results are reviewed for                            surveillance.                           - Return to GI office after studies are complete. Procedure Code(s):        --- Professional ---                           [id number], Colonoscopy, flexible; with removal of                            tumor(s), polyp(s), or other lesion(s) by snare                            technique                           99152, Moderate sedation services provided by the                            same physician or other qualified health care                            professional performing the diagnostic or  therapeutic service that the sedation supports,                            requiring the presence of an independent trained                            observer to assist in the monitoring of the                            patient's level of consciousness and physiological                            status; initial 15 minutes of intraservice time,                            patient age 74 years or older                           [phone], Moderate sedation services; each additional                            15 minutes intraservice time Diagnosis Code(s):        --- Professional ---                           Z86.010, Personal history of colonic polyps                           D12.4, Benign neoplasm of descending colon                           K62.1, Rectal polyp CPT copyright 2016 American Medical Association. All rights reserved. The codes documented in this report are  preliminary and upon coder review may  be revised to meet current compliance requirements. Cristopher Estimable. Kimothy Kishimoto, MD Norvel Richards, MD 10/22/2015 8:14:42 AM This report has been signed electronically. Number of Addenda: 0

## 2015-10-22 NOTE — Interval H&P Note (Signed)
History and Physical Interval Note:  10/22/2015 7:33 AM  Joan [name]  has presented today for surgery, with the diagnosis of CHANGE IN BOWELS/HISTORY OF POLYPS  The various methods of treatment have been discussed with the patient and family. After consideration of risks, benefits and other options for treatment, the patient has consented to  Procedure(s) with comments: COLONOSCOPY (N/A) - 730 as a surgical intervention .  The patient's history has been reviewed, patient examined, no change in status, stable for surgery.  I have reviewed the patient's chart and labs.  Questions were answered to the patient's satisfaction.     Joan [name]  No change; TCS per plan. The risks, benefits, limitations, alternatives and imponderables have been reviewed with the patient. Questions have been answered. All parties are agreeable.

## 2015-10-22 NOTE — H&P (View-Only) (Signed)
Primary Care Physician:  Wende Neighbors, MD  Primary Gastroenterologist:  Garfield Cornea, MD   Chief Complaint  Patient presents with  . Follow-up  . Dysphagia  . set up TCS    HPI:  Joan [name] is a 60 y.o. female here Requesting surveillance colonoscopy for history of adenomatous colon polyps. Last colonoscopy December 2012. Multiple adenomatous colon polyps removed. She does have some change in bowel habits more recently. May go a few days but a bowel movement and then sometimes stools runny. No regularity. Denies melena rectal bleeding. No significant abdominal pain. Reflux and swallowing much improved on omeprazole 20 mg daily. No vomiting or unintentional weight loss.  She admits to being under a lot of stress, her company is closing down June 30. Currently she does not have a job to transition to.  Current Outpatient Prescriptions  Medication Sig Dispense Refill  . CRESTOR 5 MG tablet Take 5 mg by mouth daily.     Marland Kitchen omeprazole (PRILOSEC) 20 MG capsule Take 1 capsule (20 mg total) by mouth daily. 90 capsule 3  . valsartan-hydrochlorothiazide (DIOVAN-HCT) 80-12.5 MG per tablet Take 1 tablet by mouth daily.       No current facility-administered medications for this visit.    Allergies as of 10/01/2015  . (No Known Allergies)    Past Medical History  Diagnosis Date  . PE (pulmonary embolism)     ?birth control  . Headaches, cluster   . Seasonal allergies   . Insomnia   . Tobacco abuse   . Colitis, ischemic ([place]) 2003  . Hypertension   . Hypercholesterolemia   . Tubular adenoma 2004    colonoscopy 2004  . S/P colonoscopy [id number]    2004: tubular adeboma, 2007: hyperplastic polyps. Due 2012    Past Surgical History  Procedure Laterality Date  . Colonoscopy  09/17/05    20 cm polyp removed/bx/middescending colon polyp removed with snare  . Tubal ligation    . Breast biopsy      benign  . Colonoscopy  2003    tubular adenoma removed from cecum   . Colonoscopy   03/31/2011    RMR, diverticulosis, multiple colon polyps removed (tubular adenomas). next TCS 03/2016  . Esophagogastroduodenoscopy N/A 02/15/2015    RMR, mild reflux esophagitis. empirical dilation of esophagus.   Venia Minks dilation N/A 02/15/2015    Procedure: Venia Minks DILATION;  Surgeon: Daneil Dolin, MD;  Location: [facility];  Service: Endoscopy;  Laterality: N/A;    Family History  Problem Relation Age of Onset  . Colon cancer Neg Hx     Social History   Social History  . Marital Status: Married    Spouse Name: N/A  . Number of Children: N/A  . Years of Education: N/A   Occupational History  . [employer]    Social History Main Topics  . Smoking status: Current Every Day Smoker -- 1.00 packs/day for 30 years    Types: Cigarettes  . Smokeless tobacco: Never Used     Comment: Is about to quit because work is disallowing smoking at the site  . Alcohol Use: No  . Drug Use: No  . Sexual Activity: Not on file   Other Topics Concern  . Not on file   Social History Narrative      ROS:  General: Negative for anorexia, weight loss, fever, chills, fatigue, weakness. Eyes: Negative for vision changes.  ENT: Negative for hoarseness, difficulty swallowing , nasal congestion. CV: Negative for  chest pain, angina, palpitations, dyspnea on exertion, peripheral edema.  Respiratory: Negative for dyspnea at rest, dyspnea on exertion, cough, sputum, wheezing.  GI: See history of present illness. GU:  Negative for dysuria, hematuria, urinary incontinence, urinary frequency, nocturnal urination.  MS: Negative for joint pain, low back pain.  Derm: Negative for rash or itching.  Neuro: Negative for weakness, abnormal sensation, seizure, frequent headaches, memory loss, confusion.  Psych: Negative for anxiety, depression, suicidal ideation, hallucinations.  Endo: Negative for unusual weight change.  Heme: Negative for bruising or bleeding. Allergy: Negative for rash or hives.     Physical Examination:  BP 114/72 mmHg  Pulse 84  Temp(Src) 97 F (36.1 C)  Ht 5\' 6"  (1.676 m)  Wt 198 lb 12.8 oz (90.175 kg)  BMI 32.10 kg/m2   General: Well-nourished, well-developed in no acute distress.  Head: Normocephalic, atraumatic.   Eyes: Conjunctiva pink, no icterus. Mouth: Oropharyngeal mucosa moist and pink , no lesions erythema or exudate. Neck: Supple without thyromegaly, masses, or lymphadenopathy.  Lungs: Clear to auscultation bilaterally.  Heart: Regular rate and rhythm, no murmurs rubs or gallops.  Abdomen: Bowel sounds are normal, nontender, nondistended, no hepatosplenomegaly or masses, no abdominal bruits or    hernia , no rebound or guarding.   Rectal: not performed Extremities: No lower extremity edema. No clubbing or deformities.  Neuro: Alert and oriented x 4 , grossly normal neurologically.  Skin: Warm and dry, no rash or jaundice.   Psych: Alert and cooperative, normal mood and affect.

## 2015-10-22 NOTE — Discharge Instructions (Signed)
Colonoscopy Discharge Instructions  Read the instructions outlined below and refer to this sheet in the next few weeks. These discharge instructions provide you with general information on caring for yourself after you leave the hospital. Your doctor may also give you specific instructions. While your treatment has been planned according to the most current medical practices available, unavoidable complications occasionally occur. If you have any problems or questions after discharge, call Dr. Gala Romney at [phone]. ACTIVITY  You may resume your regular activity, but move at a slower pace for the next 24 hours.   Take frequent rest periods for the next 24 hours.   Walking will help get rid of the air and reduce the bloated feeling in your belly (abdomen).   No driving for 24 hours (because of the medicine (anesthesia) used during the test).    Do not sign any important legal documents or operate any machinery for 24 hours (because of the anesthesia used during the test).  NUTRITION  Drink plenty of fluids.   You may resume your normal diet as instructed by your doctor.   Begin with a light meal and progress to your normal diet. Heavy or fried foods are harder to digest and may make you feel sick to your stomach (nauseated).   Avoid alcoholic beverages for 24 hours or as instructed.  MEDICATIONS  You may resume your normal medications unless your doctor tells you otherwise.  WHAT YOU CAN EXPECT TODAY  Some feelings of bloating in the abdomen.   Passage of more gas than usual.   Spotting of blood in your stool or on the toilet paper.  IF YOU HAD POLYPS REMOVED DURING THE COLONOSCOPY:  No aspirin products for 7 days or as instructed.   No alcohol for 7 days or as instructed.   Eat a soft diet for the next 24 hours.  FINDING OUT THE RESULTS OF YOUR TEST Not all test results are available during your visit. If your test results are not back during the visit, make an appointment  with your caregiver to find out the results. Do not assume everything is normal if you have not heard from your caregiver or the medical facility. It is important for you to follow up on all of your test results.  SEEK IMMEDIATE MEDICAL ATTENTION IF:  You have more than a spotting of blood in your stool.   Your belly is swollen (abdominal distention).   You are nauseated or vomiting.   You have a temperature over 101.   You have abdominal pain or discomfort that is severe or gets worse throughout the day.    Colon polyps and constipation information provided  Continue MiraLAX daily as needed for constipation  Further recommendations to follow pending review of pathology report Colon Polyps Polyps are lumps of extra tissue growing inside the body. Polyps can grow in the large intestine (colon). Most colon polyps are noncancerous (benign). However, some colon polyps can become cancerous over time. Polyps that are larger than a pea may be harmful. To be safe, caregivers remove and test all polyps. CAUSES  Polyps form when mutations in the genes cause your cells to grow and divide even though no more tissue is needed. RISK FACTORS There are a number of risk factors that can increase your chances of getting colon polyps. They include:  Being older than 50 years.  Family history of colon polyps or colon cancer.  Long-term colon diseases, such as colitis or Crohn disease.  Being overweight.  Smoking.  Being inactive.  Drinking too much alcohol. SYMPTOMS  Most small polyps do not cause symptoms. If symptoms are present, they may include:  Blood in the stool. The stool may look dark red or black.  Constipation or diarrhea that lasts longer than 1 week. DIAGNOSIS People often do not know they have polyps until their caregiver finds them during a regular checkup. Your caregiver can use 4 tests to check for polyps:  Digital rectal exam. The caregiver wears gloves and feels inside  the rectum. This test would find polyps only in the rectum.  Barium enema. The caregiver puts a liquid called barium into your rectum before taking X-rays of your colon. Barium makes your colon look white. Polyps are dark, so they are easy to see in the X-ray pictures.  Sigmoidoscopy. A thin, flexible tube (sigmoidoscope) is placed into your rectum. The sigmoidoscope has a light and tiny camera in it. The caregiver uses the sigmoidoscope to look at the last third of your colon.  Colonoscopy. This test is like sigmoidoscopy, but the caregiver looks at the entire colon. This is the most common method for finding and removing polyps. TREATMENT  Any polyps will be removed during a sigmoidoscopy or colonoscopy. The polyps are then tested for cancer. PREVENTION  To help lower your risk of getting more colon polyps:  Eat plenty of fruits and vegetables. Avoid eating fatty foods.  Do not smoke.  Avoid drinking alcohol.  Exercise every day.  Lose weight if recommended by your caregiver.  Eat plenty of calcium and folate. Foods that are rich in calcium include milk, cheese, and broccoli. Foods that are rich in folate include chickpeas, kidney beans, and spinach. HOME CARE INSTRUCTIONS Keep all follow-up appointments as directed by your caregiver. You may need periodic exams to check for polyps. SEEK MEDICAL CARE IF: You notice bleeding during a bowel movement.   This information is not intended to replace advice given to you by your health care provider. Make sure you discuss any questions you have with your health care provider.   Document Released: 12/25/2003 Document Revised: 04/20/2014 Document Reviewed: 06/09/2011 Elsevier Interactive Patient Education [employer].

## 2015-10-24 ENCOUNTER — Encounter (HOSPITAL_COMMUNITY): Payer: Self-pay | Admitting: Internal Medicine

## 2015-10-24 ENCOUNTER — Encounter: Payer: Self-pay | Admitting: Internal Medicine

## 2016-03-17 MED FILL — VALSARTAN-HCTZ 80-12.5 MG T: 80-12.5 | 90 days supply | Qty: 90 | Fill #0

## 2016-03-30 ENCOUNTER — Other Ambulatory Visit (HOSPITAL_COMMUNITY): Payer: Self-pay | Admitting: Internal Medicine

## 2016-03-30 DIAGNOSIS — Z1231 Encounter for screening mammogram for malignant neoplasm of breast: Secondary | ICD-10-CM

## 2016-04-08 ENCOUNTER — Ambulatory Visit (HOSPITAL_COMMUNITY)
Admission: RE | Admit: 2016-04-08 | Discharge: 2016-04-08 | Disposition: A | Discharge status: Home / Self Care | Payer: 59 | Source: Ambulatory Visit | Attending: Internal Medicine | Admitting: Internal Medicine

## 2016-04-08 DIAGNOSIS — Z1231 Encounter for screening mammogram for malignant neoplasm of breast: Secondary | ICD-10-CM | POA: Diagnosis not present

## 2016-04-24 DIAGNOSIS — E782 Mixed hyperlipidemia: Secondary | ICD-10-CM | POA: Diagnosis not present

## 2016-04-28 DIAGNOSIS — I10 Essential (primary) hypertension: Secondary | ICD-10-CM | POA: Diagnosis not present

## 2016-04-28 DIAGNOSIS — J060 Acute laryngopharyngitis: Secondary | ICD-10-CM | POA: Diagnosis not present

## 2016-04-28 DIAGNOSIS — M1991 Primary osteoarthritis, unspecified site: Secondary | ICD-10-CM | POA: Diagnosis not present

## 2016-04-28 DIAGNOSIS — E782 Mixed hyperlipidemia: Secondary | ICD-10-CM | POA: Diagnosis not present

## 2016-04-28 DIAGNOSIS — Z0001 Encounter for general adult medical examination with abnormal findings: Secondary | ICD-10-CM | POA: Diagnosis not present

## 2016-04-28 MED FILL — MELOXICAM 15 MG TABLET: 15 | 30 days supply | Qty: 30 | Fill #0

## 2016-05-18 DIAGNOSIS — Z947 Corneal transplant status: Secondary | ICD-10-CM | POA: Diagnosis not present

## 2016-05-18 DIAGNOSIS — H2512 Age-related nuclear cataract, left eye: Secondary | ICD-10-CM | POA: Diagnosis not present

## 2016-05-18 DIAGNOSIS — H18612 Keratoconus, stable, left eye: Secondary | ICD-10-CM | POA: Diagnosis not present

## 2016-05-18 DIAGNOSIS — Z961 Presence of intraocular lens: Secondary | ICD-10-CM | POA: Diagnosis not present

## 2016-05-26 MED FILL — MELOXICAM 15 MG TABLET: 15 | 90 days supply | Qty: 90 | Fill #0

## 2016-05-26 MED FILL — ROSUVASTATIN CALCIUM 10 MG: 10 | 90 days supply | Qty: 90 | Fill #0

## 2016-06-25 MED FILL — VALSARTAN-HCTZ 80-12.5 MG T: 80-12.5 | 90 days supply | Qty: 90 | Fill #0

## 2016-09-16 MED FILL — MELOXICAM 15 MG TABLET: 15 | 90 days supply | Qty: 90 | Fill #1

## 2016-09-16 MED FILL — VALSARTAN-HCTZ 80-12.5 MG T: 80-12.5 | 90 days supply | Qty: 90 | Fill #1

## 2016-10-13 DIAGNOSIS — E782 Mixed hyperlipidemia: Secondary | ICD-10-CM | POA: Diagnosis not present

## 2016-10-13 DIAGNOSIS — I10 Essential (primary) hypertension: Secondary | ICD-10-CM | POA: Diagnosis not present

## 2016-10-13 DIAGNOSIS — Z1159 Encounter for screening for other viral diseases: Secondary | ICD-10-CM | POA: Diagnosis not present

## 2016-10-27 DIAGNOSIS — M130 Polyarthritis, unspecified: Secondary | ICD-10-CM | POA: Diagnosis not present

## 2016-10-27 DIAGNOSIS — I10 Essential (primary) hypertension: Secondary | ICD-10-CM | POA: Diagnosis not present

## 2016-10-27 DIAGNOSIS — E782 Mixed hyperlipidemia: Secondary | ICD-10-CM | POA: Diagnosis not present

## 2016-10-27 DIAGNOSIS — Z6831 Body mass index (BMI) 31.0-31.9, adult: Secondary | ICD-10-CM | POA: Diagnosis not present

## 2016-12-22 MED FILL — VALSARTAN-HCTZ 80-12.5 MG T: 80-12.5 | 90 days supply | Qty: 90 | Fill #2

## 2017-02-25 ENCOUNTER — Encounter: Payer: Self-pay | Admitting: Orthopaedic Surgery

## 2017-02-25 ENCOUNTER — Ambulatory Visit (INDEPENDENT_AMBULATORY_CARE_PROVIDER_SITE_OTHER): Payer: 59

## 2017-02-25 ENCOUNTER — Ambulatory Visit: Payer: BLUE CROSS/BLUE SHIELD | Admitting: Orthopaedic Surgery

## 2017-02-25 VITALS — BP 112/73 | HR 77 | Temp 97.7°F | Ht 66.0 in | Wt 199.0 lb

## 2017-02-25 DIAGNOSIS — M25532 Pain in left wrist: Secondary | ICD-10-CM

## 2017-02-25 DIAGNOSIS — M779 Enthesopathy, unspecified: Secondary | ICD-10-CM

## 2017-02-25 DIAGNOSIS — F17210 Nicotine dependence, cigarettes, uncomplicated: Secondary | ICD-10-CM

## 2017-02-25 DIAGNOSIS — M778 Other enthesopathies, not elsewhere classified: Secondary | ICD-10-CM

## 2017-02-25 MED ORDER — NAPROXEN 500 MG PO TABS: 500.0000 mg | ORAL_TABLET | Freq: Two times a day (BID) | ORAL | 5 refills | Status: DC

## 2017-02-25 NOTE — Progress Notes (Signed)
Subjective:    Patient ID: Joan [name], female    DOB: [birth year], 61 y.o.   MRN: [id number]  HPI She has had pain of the left thumb and wrist for several months getting worse.  She has no trauma, no redness.  She has pain trying to lift heavy objects, pain in twisting her wrist and now the pain bothers her most of the time with movement of the left hand/wrist.  She has tried ibuprofen, rest, heat, ice and rubs with no help.  It just bothers her all the time.  Review of Systems  Respiratory: Negative for cough and shortness of breath.   Cardiovascular: Negative for chest pain and leg swelling.  Endocrine: Positive for cold intolerance.  Musculoskeletal: Positive for arthralgias.  Allergic/Immunologic: Positive for environmental allergies.   Past Medical History:  Diagnosis Date  . Colitis, ischemic ([place]) 2003  . Headaches, cluster   . Hypercholesterolemia   . Hypertension   . Insomnia   . PE (pulmonary embolism)    ?birth control  . S/P colonoscopy [id number],[id number]   2004: tubular adeboma, 2007: hyperplastic polyps. Due 2012  . Seasonal allergies   . Tobacco abuse   . Tubular adenoma 2004   colonoscopy 2004    Past Surgical History:  Procedure Laterality Date  . BREAST BIOPSY     benign  . COLONOSCOPY  09/17/05   20 cm polyp removed/bx/middescending colon polyp removed with snare  . COLONOSCOPY  2003   tubular adenoma removed from cecum   . COLONOSCOPY  03/31/2011   RMR, diverticulosis, multiple colon polyps removed (tubular adenomas). next TCS 03/2016  . COLONOSCOPY N/A 10/22/2015   Procedure: COLONOSCOPY;  Surgeon: Daneil Dolin, MD;  Location[facility];  Service: Endoscopy;  Laterality: N/A;  730  . ESOPHAGOGASTRODUODENOSCOPY N/A 02/15/2015   RMR, mild reflux esophagitis. empirical dilation of esophagus.   Marland Kitchen MALONEY DILATION N/A 02/15/2015   Procedure: Venia Minks DILATION;  Surgeon: Daneil Dolin, MD;  [facility];  Service: Endoscopy;  Laterality: N/A;    . TUBAL LIGATION      Current Outpatient Medications on File Prior to Visit  Medication Sig Dispense Refill  . valsartan-hydrochlorothiazide (DIOVAN-HCT) 80-12.5 MG per tablet Take 1 tablet by mouth daily.      Marland Kitchen omeprazole (PRILOSEC) 20 MG capsule Take 1 capsule (20 mg total) by mouth daily. (Patient not taking: Reported on 02/25/2017) 90 capsule 3   No current facility-administered medications on file prior to visit.     Social History   Socioeconomic History  . Marital status: Married    Spouse name: Not on file  . Number of children: Not on file  . Years of education: Not on file  . Highest education level: Not on file  Social Needs  . Financial resource strain: Not on file  . Food insecurity - worry: Not on file  . Food insecurity - inability: Not on file  . Transportation needs - medical: Not on file  . Transportation needs - non-medical: Not on file  Occupational History  . Occupation: Building services engineer  Tobacco Use  . Smoking status: Current Every Day Smoker    Packs/day: 1.00    Years: 30.00    Pack years: 30.00    Types: Cigarettes  . Smokeless tobacco: Never Used  . Tobacco comment: Is about to quit because work is disallowing smoking at the site  Substance and Sexual Activity  . Alcohol use: No    Alcohol/week: 0.0 oz  .  Drug use: No  . Sexual activity: Not on file  Other Topics Concern  . Not on file  Social History Narrative  . Not on file    Family History  Problem Relation Age of Onset  . CVA Father   . Hypertension Father     BP 112/73   Pulse 77   Temp 97.7 F (36.5 C)   Ht 5\' 6"  (1.676 m)   Wt 199 lb (90.3 kg)   BMI 32.12 kg/m      Objective:   Physical Exam  Constitutional: She is oriented to person, place, and time. She appears well-developed and well-nourished.  HENT:  Head: Normocephalic and atraumatic.  Eyes: Conjunctivae and EOM are normal. Pupils are equal, round, and reactive to light.  Neck: Normal range of motion. Neck  supple.  Cardiovascular: Normal rate, regular rhythm and intact distal pulses.  Pulmonary/Chest: Effort normal.  Abdominal: Soft.  Musculoskeletal: She exhibits tenderness (She has pain of the first extensor compartment of the left wrist, positive finkelstein sign, NV intact, ROM full but tender, no swelling.  Right wrist negative.).  Neurological: She is alert and oriented to person, place, and time. She displays normal reflexes. No cranial nerve deficit. She exhibits normal muscle tone. Coordination normal.  Skin: Skin is warm and dry.  Psychiatric: She has a normal mood and affect. Her behavior is normal. Judgment and thought content normal.  Vitals reviewed.   X-rays were done of the left wrist, reported separately.      Assessment & Plan:   Encounter Diagnoses  Name Primary?  . Left wrist pain Yes  . Tendinitis of thumb   . Cigarette nicotine dependence without complication    Procedure note:  After permission from the patient the left wrist was prepped.  An injection of 1% Xylocaine and 1 cc DepoMedrol 40 was instilled into the first compartment of the left wrist by sterile technique tolerated well.  I have called in Naprosyn for her, precautions given.  Return in two weeks.  A thumb splint given.  Call if any problem.  Precautions discussed.   Electronically Signed Sanjuana Kava, MD 11/15/201810:08 AM

## 2017-02-25 NOTE — Patient Instructions (Signed)
Steps to Quit Smoking Smoking tobacco can be bad for your health. It can also affect almost every organ in your body. Smoking puts you and people around you at risk for many serious long-lasting (chronic) diseases. Quitting smoking is hard, but it is one of the best things that you can do for your health. It is never too late to quit. What are the benefits of quitting smoking? When you quit smoking, you lower your risk for getting serious diseases and conditions. They can include:  Lung cancer or lung disease.  Heart disease.  Stroke.  Heart attack.  Not being able to have children (infertility).  Weak bones (osteoporosis) and broken bones (fractures).  If you have coughing, wheezing, and shortness of breath, those symptoms may get better when you quit. You may also get sick less often. If you are pregnant, quitting smoking can help to lower your chances of having a baby of low birth weight. What can I do to help me quit smoking? Talk with your doctor about what can help you quit smoking. Some things you can do (strategies) include:  Quitting smoking totally, instead of slowly cutting back how much you smoke over a period of time.  Going to in-person counseling. You are more likely to quit if you go to many counseling sessions.  Using resources and support systems, such as: ? Online chats with a counselor. ? Phone quitlines. ? Printed self-help materials. ? Support groups or group counseling. ? Text messaging programs. ? Mobile phone apps or applications.  Taking medicines. Some of these medicines may have nicotine in them. If you are pregnant or breastfeeding, do not take any medicines to quit smoking unless your doctor says it is okay. Talk with your doctor about counseling or other things that can help you.  Talk with your doctor about using more than one strategy at the same time, such as taking medicines while you are also going to in-person counseling. This can help make  quitting easier. What things can I do to make it easier to quit? Quitting smoking might feel very hard at first, but there is a lot that you can do to make it easier. Take these steps:  Talk to your family and friends. Ask them to support and encourage you.  Call phone quitlines, reach out to support groups, or work with a counselor.  Ask people who smoke to not smoke around you.  Avoid places that make you want (trigger) to smoke, such as: ? Bars. ? Parties. ? Smoke-break areas at work.  Spend time with people who do not smoke.  Lower the stress in your life. Stress can make you want to smoke. Try these things to help your stress: ? Getting regular exercise. ? Deep-breathing exercises. ? Yoga. ? Meditating. ? Doing a body scan. To do this, close your eyes, focus on one area of your body at a time from head to toe, and notice which parts of your body are tense. Try to relax the muscles in those areas.  Download or buy apps on your mobile phone or tablet that can help you stick to your quit plan. There are many free apps, such as QuitGuide from the CDC (Centers for Disease Control and Prevention). You can find more support from smokefree.gov and other websites.  This information is not intended to replace advice given to you by your health care provider. Make sure you discuss any questions you have with your health care provider. Document Released: 01/24/2009 Document   Revised: 11/26/2015 Document Reviewed: 08/14/2014 Elsevier Interactive Patient Education  2018 Elsevier Inc.  

## 2017-02-26 ENCOUNTER — Other Ambulatory Visit (HOSPITAL_COMMUNITY): Payer: Self-pay | Admitting: Internal Medicine

## 2017-02-26 DIAGNOSIS — Z1231 Encounter for screening mammogram for malignant neoplasm of breast: Secondary | ICD-10-CM

## 2017-03-09 MED FILL — VALSARTAN-HCTZ 80-12.5 MG T: 80-12.5 | 90 days supply | Qty: 90 | Fill #3

## 2017-03-11 ENCOUNTER — Encounter: Payer: Self-pay | Admitting: Orthopaedic Surgery

## 2017-03-11 ENCOUNTER — Ambulatory Visit: Payer: 59 | Admitting: Orthopaedic Surgery

## 2017-03-11 VITALS — BP 117/71 | HR 77 | Temp 97.2°F | Ht 66.0 in | Wt 200.0 lb

## 2017-03-11 DIAGNOSIS — M778 Other enthesopathies, not elsewhere classified: Secondary | ICD-10-CM

## 2017-03-11 DIAGNOSIS — M779 Enthesopathy, unspecified: Principal | ICD-10-CM

## 2017-03-11 NOTE — Progress Notes (Signed)
CC:  My thumb is still tender but not as bad as last time.  She has DeQuervain's on the left.  She has much less pain but still has periods of time when it hurts more.  She would like an injection today again.  Procedure note:  After permission from the patient and sterile prep of the first extensor compartment of the left wrist, 1% Xylocaine and 1 cc of DepoMedrol 40 was instilled into the first compartment extensor tendon sheath by sterile technique tolerated well.  Encounter Diagnosis  Name Primary?  . Tendinitis of thumb Yes   Return in six weeks.  Continue NSAIDs.  Call if any problem.  Precautions discussed.   Electronically Signed Sanjuana Kava, MD 11/29/20188:58 AM

## 2017-03-11 NOTE — Patient Instructions (Signed)

## 2017-04-09 ENCOUNTER — Ambulatory Visit (HOSPITAL_COMMUNITY)
Admission: RE | Admit: 2017-04-09 | Discharge: 2017-04-09 | Disposition: A | Discharge status: Home / Self Care | Payer: 59 | Source: Ambulatory Visit | Attending: Internal Medicine | Admitting: Internal Medicine

## 2017-04-09 ENCOUNTER — Encounter (HOSPITAL_COMMUNITY): Payer: Self-pay

## 2017-04-09 DIAGNOSIS — Z1231 Encounter for screening mammogram for malignant neoplasm of breast: Secondary | ICD-10-CM | POA: Insufficient documentation

## 2017-04-22 ENCOUNTER — Ambulatory Visit: Payer: 59 | Admitting: Orthopaedic Surgery

## 2017-04-27 DIAGNOSIS — E049 Nontoxic goiter, unspecified: Secondary | ICD-10-CM | POA: Diagnosis not present

## 2017-04-27 DIAGNOSIS — E782 Mixed hyperlipidemia: Secondary | ICD-10-CM | POA: Diagnosis not present

## 2017-04-27 DIAGNOSIS — I10 Essential (primary) hypertension: Secondary | ICD-10-CM | POA: Diagnosis not present

## 2017-05-06 DIAGNOSIS — I10 Essential (primary) hypertension: Secondary | ICD-10-CM | POA: Diagnosis not present

## 2017-05-06 DIAGNOSIS — Z6833 Body mass index (BMI) 33.0-33.9, adult: Secondary | ICD-10-CM | POA: Diagnosis not present

## 2017-05-06 DIAGNOSIS — F411 Generalized anxiety disorder: Secondary | ICD-10-CM | POA: Diagnosis not present

## 2017-05-06 DIAGNOSIS — E782 Mixed hyperlipidemia: Secondary | ICD-10-CM | POA: Diagnosis not present

## 2017-05-06 DIAGNOSIS — J309 Allergic rhinitis, unspecified: Secondary | ICD-10-CM | POA: Diagnosis not present

## 2017-06-16 MED FILL — VALSARTAN-HCTZ 80-12.5 MG T: 80-12.5 | 90 days supply | Qty: 90 | Fill #0

## 2017-06-16 MED FILL — ROSUVASTATIN CALCIUM 10 MG: 10 | 90 days supply | Qty: 90 | Fill #0

## 2017-09-13 MED FILL — ROSUVASTATIN CALCIUM 10 MG: 10 | 90 days supply | Qty: 90 | Fill #1

## 2017-09-13 MED FILL — VALSARTAN-HCTZ 80-12.5 MG T: 80-12.5 | 90 days supply | Qty: 90 | Fill #1

## 2017-10-12 DIAGNOSIS — E782 Mixed hyperlipidemia: Secondary | ICD-10-CM | POA: Diagnosis not present

## 2017-10-12 DIAGNOSIS — I10 Essential (primary) hypertension: Secondary | ICD-10-CM | POA: Diagnosis not present

## 2017-11-04 DIAGNOSIS — Z6830 Body mass index (BMI) 30.0-30.9, adult: Secondary | ICD-10-CM | POA: Diagnosis not present

## 2017-11-04 DIAGNOSIS — Z712 Person consulting for explanation of examination or test findings: Secondary | ICD-10-CM | POA: Diagnosis not present

## 2017-11-04 DIAGNOSIS — E782 Mixed hyperlipidemia: Secondary | ICD-10-CM | POA: Diagnosis not present

## 2017-11-04 DIAGNOSIS — I10 Essential (primary) hypertension: Secondary | ICD-10-CM | POA: Diagnosis not present

## 2017-11-04 DIAGNOSIS — F39 Unspecified mood [affective] disorder: Secondary | ICD-10-CM | POA: Diagnosis not present

## 2017-11-04 DIAGNOSIS — F411 Generalized anxiety disorder: Secondary | ICD-10-CM | POA: Diagnosis not present

## 2017-11-04 MED FILL — LORazepam 0.5 MG TABS: 0.5 | 15 days supply | Qty: 30 | Fill #0

## 2017-11-30 MED FILL — VALSARTAN-HCTZ 80-12.5 MG T: 80-12.5 | 90 days supply | Qty: 90 | Fill #2

## 2018-02-02 MED FILL — ROSUVASTATIN CALCIUM 10 MG: 10 | 90 days supply | Qty: 90 | Fill #2

## 2018-03-03 ENCOUNTER — Other Ambulatory Visit (HOSPITAL_COMMUNITY): Payer: Self-pay | Admitting: Internal Medicine

## 2018-03-03 DIAGNOSIS — Z1231 Encounter for screening mammogram for malignant neoplasm of breast: Secondary | ICD-10-CM

## 2018-03-11 MED FILL — VALSARTAN-HCTZ 80-12.5 MG T: 80-12.5 | 90 days supply | Qty: 90 | Fill #0

## 2018-04-11 ENCOUNTER — Ambulatory Visit (HOSPITAL_COMMUNITY)
Admission: RE | Admit: 2018-04-11 | Discharge: 2018-04-11 | Disposition: A | Discharge status: Home / Self Care | Payer: 59 | Source: Ambulatory Visit | Attending: Internal Medicine | Admitting: Internal Medicine

## 2018-04-11 DIAGNOSIS — Z1231 Encounter for screening mammogram for malignant neoplasm of breast: Secondary | ICD-10-CM | POA: Diagnosis not present

## 2018-05-05 DIAGNOSIS — E782 Mixed hyperlipidemia: Secondary | ICD-10-CM | POA: Diagnosis not present

## 2018-05-05 DIAGNOSIS — I10 Essential (primary) hypertension: Secondary | ICD-10-CM | POA: Diagnosis not present

## 2018-05-10 DIAGNOSIS — F411 Generalized anxiety disorder: Secondary | ICD-10-CM | POA: Diagnosis not present

## 2018-05-10 DIAGNOSIS — M19041 Primary osteoarthritis, right hand: Secondary | ICD-10-CM | POA: Diagnosis not present

## 2018-05-10 DIAGNOSIS — E049 Nontoxic goiter, unspecified: Secondary | ICD-10-CM | POA: Diagnosis not present

## 2018-05-10 DIAGNOSIS — F17210 Nicotine dependence, cigarettes, uncomplicated: Secondary | ICD-10-CM | POA: Diagnosis not present

## 2018-05-10 DIAGNOSIS — E782 Mixed hyperlipidemia: Secondary | ICD-10-CM | POA: Diagnosis not present

## 2018-05-10 DIAGNOSIS — F39 Unspecified mood [affective] disorder: Secondary | ICD-10-CM | POA: Diagnosis not present

## 2018-05-10 DIAGNOSIS — K219 Gastro-esophageal reflux disease without esophagitis: Secondary | ICD-10-CM | POA: Diagnosis not present

## 2018-05-10 DIAGNOSIS — I10 Essential (primary) hypertension: Secondary | ICD-10-CM | POA: Diagnosis not present

## 2018-05-10 DIAGNOSIS — F5101 Primary insomnia: Secondary | ICD-10-CM | POA: Diagnosis not present

## 2018-05-10 DIAGNOSIS — Z124 Encounter for screening for malignant neoplasm of cervix: Secondary | ICD-10-CM | POA: Diagnosis not present

## 2018-05-10 DIAGNOSIS — I259 Chronic ischemic heart disease, unspecified: Secondary | ICD-10-CM | POA: Diagnosis not present

## 2018-05-10 DIAGNOSIS — Z720 Tobacco use: Secondary | ICD-10-CM | POA: Diagnosis not present

## 2018-05-11 MED FILL — ROSUVASTATIN CALCIUM 10 MG: 10 | 90 days supply | Qty: 90 | Fill #0

## 2018-05-17 MED FILL — MELOXICAM 15 MG TABLET: 15 | 30 days supply | Qty: 30 | Fill #0

## 2018-05-17 MED FILL — FLUCONAZOLE 50 MG TABS: 50 | 3 days supply | Qty: 2 | Fill #0

## 2018-05-18 ENCOUNTER — Other Ambulatory Visit: Payer: Self-pay | Admitting: Internal Medicine

## 2018-05-18 ENCOUNTER — Other Ambulatory Visit (HOSPITAL_COMMUNITY): Payer: Self-pay | Admitting: Internal Medicine

## 2018-05-18 DIAGNOSIS — F17200 Nicotine dependence, unspecified, uncomplicated: Secondary | ICD-10-CM

## 2018-05-28 ENCOUNTER — Emergency Department (HOSPITAL_COMMUNITY): Payer: 59

## 2018-05-28 ENCOUNTER — Inpatient Hospital Stay (HOSPITAL_COMMUNITY)
Admission: EM | Admit: 2018-05-28 | Discharge: 2018-05-31 | DRG: 060 | Disposition: A | Discharge status: Home / Self Care | Payer: 59 | Attending: Internal Medicine | Admitting: Internal Medicine

## 2018-05-28 ENCOUNTER — Encounter (HOSPITAL_COMMUNITY): Payer: Self-pay | Admitting: *Deleted

## 2018-05-28 DIAGNOSIS — Z86711 Personal history of pulmonary embolism: Secondary | ICD-10-CM

## 2018-05-28 DIAGNOSIS — M5126 Other intervertebral disc displacement, lumbar region: Secondary | ICD-10-CM | POA: Diagnosis not present

## 2018-05-28 DIAGNOSIS — G959 Disease of spinal cord, unspecified: Secondary | ICD-10-CM | POA: Diagnosis not present

## 2018-05-28 DIAGNOSIS — E876 Hypokalemia: Secondary | ICD-10-CM | POA: Diagnosis present

## 2018-05-28 DIAGNOSIS — M5137 Other intervertebral disc degeneration, lumbosacral region: Secondary | ICD-10-CM | POA: Diagnosis present

## 2018-05-28 DIAGNOSIS — G379 Demyelinating disease of central nervous system, unspecified: Secondary | ICD-10-CM | POA: Diagnosis present

## 2018-05-28 DIAGNOSIS — Z8249 Family history of ischemic heart disease and other diseases of the circulatory system: Secondary | ICD-10-CM

## 2018-05-28 DIAGNOSIS — R200 Anesthesia of skin: Secondary | ICD-10-CM | POA: Diagnosis not present

## 2018-05-28 DIAGNOSIS — K210 Gastro-esophageal reflux disease with esophagitis: Secondary | ICD-10-CM | POA: Diagnosis not present

## 2018-05-28 DIAGNOSIS — E7800 Pure hypercholesterolemia, unspecified: Secondary | ICD-10-CM | POA: Diagnosis not present

## 2018-05-28 DIAGNOSIS — M1990 Unspecified osteoarthritis, unspecified site: Secondary | ICD-10-CM | POA: Diagnosis present

## 2018-05-28 DIAGNOSIS — M153 Secondary multiple arthritis: Secondary | ICD-10-CM | POA: Diagnosis not present

## 2018-05-28 DIAGNOSIS — E559 Vitamin D deficiency, unspecified: Secondary | ICD-10-CM | POA: Diagnosis present

## 2018-05-28 DIAGNOSIS — F17210 Nicotine dependence, cigarettes, uncomplicated: Secondary | ICD-10-CM | POA: Diagnosis present

## 2018-05-28 DIAGNOSIS — Z86010 Personal history of colonic polyps: Secondary | ICD-10-CM | POA: Diagnosis not present

## 2018-05-28 DIAGNOSIS — I10 Essential (primary) hypertension: Secondary | ICD-10-CM | POA: Diagnosis not present

## 2018-05-28 DIAGNOSIS — G35D Multiple sclerosis, unspecified: Secondary | ICD-10-CM | POA: Diagnosis present

## 2018-05-28 DIAGNOSIS — G378 Other specified demyelinating diseases of central nervous system: Secondary | ICD-10-CM | POA: Diagnosis not present

## 2018-05-28 DIAGNOSIS — R29898 Other symptoms and signs involving the musculoskeletal system: Secondary | ICD-10-CM | POA: Diagnosis not present

## 2018-05-28 DIAGNOSIS — Z823 Family history of stroke: Secondary | ICD-10-CM

## 2018-05-28 DIAGNOSIS — G35 Multiple sclerosis: Secondary | ICD-10-CM | POA: Diagnosis not present

## 2018-05-28 DIAGNOSIS — M6281 Muscle weakness (generalized): Secondary | ICD-10-CM | POA: Diagnosis not present

## 2018-05-28 DIAGNOSIS — E785 Hyperlipidemia, unspecified: Secondary | ICD-10-CM | POA: Diagnosis not present

## 2018-05-28 DIAGNOSIS — Z720 Tobacco use: Secondary | ICD-10-CM | POA: Diagnosis not present

## 2018-05-28 DIAGNOSIS — Z79899 Other long term (current) drug therapy: Secondary | ICD-10-CM

## 2018-05-28 DIAGNOSIS — E783 Hyperchylomicronemia: Secondary | ICD-10-CM | POA: Diagnosis not present

## 2018-05-28 HISTORY — DX: Multiple sclerosis: G35

## 2018-05-28 LAB — BASIC METABOLIC PANEL
ANION GAP: 10 (ref 5–15)
BUN: 16 mg/dL (ref 8–23)
CHLORIDE: 105 mmol/L (ref 98–111)
CO2: 21 mmol/L — ABNORMAL LOW (ref 22–32)
Calcium: 9.1 mg/dL (ref 8.9–10.3)
Creatinine, Ser: 0.61 mg/dL (ref 0.44–1.00)
GFR calc non Af Amer: >60 mL/min (ref >60)
GLUCOSE: 97 mg/dL (ref 70–99)
Potassium: 3.5 mmol/L (ref 3.5–5.1)
Sodium: 136 mmol/L (ref 135–145)

## 2018-05-28 LAB — CBC WITH DIFFERENTIAL/PLATELET
Abs Immature Granulocytes: 0.04 10*3/uL (ref 0.00–0.07)
BASOS ABS: 0.1 10*3/uL (ref 0.0–0.1)
BASOS PCT: 1 %
Eosinophils Absolute: 0.3 10*3/uL (ref 0.0–0.5)
Eosinophils Relative: 3 %
HCT: 43.4 % (ref 36.0–46.0)
Hemoglobin: 14.8 g/dL (ref 12.0–15.0)
Immature Granulocytes: 0 %
Lymphocytes Relative: 32 %
Lymphs Abs: 3.5 10*3/uL (ref 0.7–4.0)
MCH: 33.3 pg (ref 26.0–34.0)
MCHC: 34.1 g/dL (ref 30.0–36.0)
MCV: 97.5 fL (ref 80.0–100.0)
Monocytes Absolute: 0.6 10*3/uL (ref 0.1–1.0)
Monocytes Relative: 6 %
NEUTROS ABS: 6.5 10*3/uL (ref 1.7–7.7)
NEUTROS PCT: 58 %
Platelets: 318 10*3/uL (ref 150–400)
RBC: 4.45 MIL/uL (ref 3.87–5.11)
RDW: 12.1 % (ref 11.5–15.5)
WBC: 11 10*3/uL — ABNORMAL HIGH (ref 4.0–10.5)
nRBC: 0 % (ref 0.0–0.2)

## 2018-05-28 MED ORDER — GADOBUTROL 1 MMOL/ML IV SOLN
9.0000 mL | Freq: Once | INTRAVENOUS | Status: AC | PRN
  Administered 2018-05-28: 9 mL via INTRAVENOUS

## 2018-05-28 MED ORDER — NICOTINE 14 MG/24HR TD PT24
14.0000 mg | MEDICATED_PATCH | Freq: Once | TRANSDERMAL | Status: AC
  Administered 2018-05-28: 14 mg via TRANSDERMAL
  Filled 2018-05-28: qty 1

## 2018-05-28 MED ORDER — MORPHINE SULFATE (PF) 4 MG/ML IV SOLN
4.0000 mg | Freq: Once | INTRAVENOUS | Status: AC
  Administered 2018-05-28: 4 mg via INTRAVENOUS
  Filled 2018-05-28: qty 1

## 2018-05-28 MED ORDER — IBUPROFEN 400 MG PO TABS
600.0000 mg | ORAL_TABLET | Freq: Once | ORAL | Status: AC
  Administered 2018-05-28: 600 mg via ORAL
  Filled 2018-05-28: qty 1

## 2018-05-28 MED ORDER — LORAZEPAM 1 MG PO TABS
1.0000 mg | ORAL_TABLET | Freq: Once | ORAL | Status: AC
  Administered 2018-05-28: 1 mg via ORAL
  Filled 2018-05-28: qty 1

## 2018-05-28 MED ORDER — ONDANSETRON HCL 4 MG/2ML IJ SOLN
4.0000 mg | Freq: Once | INTRAMUSCULAR | Status: AC
  Administered 2018-05-28: 4 mg via INTRAVENOUS
  Filled 2018-05-28: qty 2

## 2018-05-28 NOTE — ED Provider Notes (Signed)
Physical Exam  BP 114/62 (BP Location: Right Arm)   Pulse 78   Temp 97.7 F (36.5 C) (Oral)   Resp 16   SpO2 100%   Assumed care from [employer], PA-C at 2300. Briefly, the patient is a 63 y.o. female with PMHx of  has a past medical history of Colitis, ischemic ([place]) (2003), Headaches, cluster, Hypercholesterolemia, Hypertension, Insomnia, PE (pulmonary embolism), S/P colonoscopy ([id number]), Seasonal allergies, Tobacco abuse, and Tubular adenoma (2004). here with "heaviness in legs" for 3 days and bilateral LE numbness.  She states that her symptoms have seemed to settle on her right side and her right leg is more heavy.  Labs Reviewed  BASIC METABOLIC PANEL - Abnormal; Notable for the following components:      Result Value   CO2 21 (*)    All other components within normal limits  CBC WITH DIFFERENTIAL/PLATELET - Abnormal; Notable for the following components:   WBC 11.0 (*)    All other components within normal limits    Course of Care:   Physical Exam Vitals signs and nursing note reviewed.  Constitutional:      General: She is not in acute distress.    Appearance: She is well-developed. She is not diaphoretic.     Comments: Sitting comfortably in bed.  HENT:     Head: Normocephalic and atraumatic.  Eyes:     General:        Right eye: No discharge.        Left eye: No discharge.     Conjunctiva/sclera: Conjunctivae normal.     Comments: EOMs normal to gross examination.  Neck:     Musculoskeletal: Normal range of motion.  Cardiovascular:     Rate and Rhythm: Normal rate and regular rhythm.     Comments: Intact, 2+ radial pulse. Abdominal:     General: There is no distension.  Musculoskeletal: Normal range of motion.  Skin:    General: Skin is warm and dry.  Neurological:     Mental Status: She is alert.     Comments: Patient has 5 out of 5 muscle strength in bilateral lower extremities.  3+ hyperreflexia of patellar reflex of right lower extremity.  2+  patellar reflex of left lower extremity.  2+ plantar reflexes of the lower extremities bilaterally.  All reflexes are brisk.  No clonus in bilateral lower extremities. Slightly unsteady gait. No ataxia.   Psychiatric:        Behavior: Behavior normal.        Thought Content: Thought content normal.        Judgment: Judgment normal.     ED Course/Procedures   Clinical Course as of May 29 4  Sun May 29, 2018  0005 Spoke with Dr. Rory Percy of neurology who would like patient admitted to hospital medicine.  Decision will be made tomorrow by day team regarding steroid treatment.  Will obtain chest x-ray and urinalysis to determine no concomitant infection in case she needs steroids.   [AM]    Clinical Course User Index [AM] Albesa Seen, PA-C    Procedures  MDM   Patient is well-appearing in no acute distress on my examination.  She does exhibit some discomfort and unsteadiness with walking due to her symptoms.  She does still exhibit hyperreflexia particularly of her right patellar reflex.   Patient's MRIs of the brain, cervical spine, and thoracic spine demonstrate hyperintensities consistent with demyelinating lesions.  Case discussed with neurology, Dr. Rory Percy who will round  on patient in the morning.  Does not recommend immediate steroid treatment at this time, and decision will be made regarding steroids tomorrow.  Urinalysis and chest x-ray ordered in preparation for this.  Presentation and evaluation is also not consistent with AIDP per Dr. Rory Percy.  No lumbar puncture indicated at this time.  Appreciate his involvement in the care of this patient.  Patient admitted per Dr. Kyung Bacca.  Appreciate her involvement in the care of this patient.     Albesa Seen, PA-C 05/29/18 4332    Varney Biles, MD 05/29/18 2302

## 2018-05-28 NOTE — ED Notes (Signed)
Patient transported to MRI 

## 2018-05-28 NOTE — ED Notes (Signed)
Pt transported back to MRI.  

## 2018-05-28 NOTE — ED Notes (Signed)
Pt requesting to leave. EDP made aware

## 2018-05-28 NOTE — ED Provider Notes (Signed)
Care assumed from Dr. Tamera Punt at shift change pending MRI imaging.   Per her note, "Patient presents with numbness in her legs.  She initially reported a couple days ago that started with numbness in both of her legs when she stood up.  However yesterday, the left leg has normalized but the whole right leg feels numb.  She denies any weakness although when I had her stand on 1 leg independently both of her legs were wobbly.  She has numbness in her right leg as compared to her left.  She denies any arm involvement.  No speech deficits.  No vision changes.  She does have hyperreflexia in both of her patellar reflexes, more on the right.  MRI of her lumbar spine is negative.  Neurology was consulted who has ordered MRI of the brain, cervical and thoracic spine.  These are still pending."  Plan is to follow up on results of imaging studies and likely reconsult neurology based on results.   Physical Exam  BP 136/82 (BP Location: Right Arm)   Pulse 80   Temp 97.7 F (36.5 C) (Oral)   Resp 16   SpO2 96%   Physical Exam Vitals signs and nursing note reviewed.  Constitutional:      General: She is not in acute distress.    Appearance: She is well-developed.  HENT:     Head: Normocephalic and atraumatic.  Eyes:     Conjunctiva/sclera: Conjunctivae normal.  Neck:     Musculoskeletal: Neck supple.  Cardiovascular:     Rate and Rhythm: Normal rate.  Pulmonary:     Effort: Pulmonary effort is normal.  Musculoskeletal: Normal range of motion.  Skin:    General: Skin is warm and dry.  Neurological:     Mental Status: She is alert.     Comments: Patient ambulating in the room with steady gait.  Moving all extremities.     ED Course/Procedures     Procedures  Results for orders placed or performed during the hospital encounter of 03/29/23  Basic metabolic panel  Result Value Ref Range   Sodium 136 135 - 145 mmol/L   Potassium 3.5 3.5 - 5.1 mmol/L   Chloride 105 98 - 111 mmol/L   CO2 21  (L) 22 - 32 mmol/L   Glucose, Bld 97 70 - 99 mg/dL   BUN 16 8 - 23 mg/dL   Creatinine, Ser 0.61 0.44 - 1.00 mg/dL   Calcium 9.1 8.9 - 10.3 mg/dL   GFR calc non Af Amer >60 >60 mL/min   GFR calc Af Amer >60 >60 mL/min   Anion gap 10 5 - 15  CBC with Differential  Result Value Ref Range   WBC 11.0 (H) 4.0 - 10.5 K/uL   RBC 4.45 3.87 - 5.11 MIL/uL   Hemoglobin 14.8 12.0 - 15.0 g/dL   HCT 43.4 36.0 - 46.0 %   MCV 97.5 80.0 - 100.0 fL   MCH 33.3 26.0 - 34.0 pg   MCHC 34.1 30.0 - 36.0 g/dL   RDW 12.1 11.5 - 15.5 %   Platelets 318 150 - 400 K/uL   nRBC 0.0 0.0 - 0.2 %   Neutrophils Relative % 58 %   Neutro Abs 6.5 1.7 - 7.7 K/uL   Lymphocytes Relative 32 %   Lymphs Abs 3.5 0.7 - 4.0 K/uL   Monocytes Relative 6 %   Monocytes Absolute 0.6 0.1 - 1.0 K/uL   Eosinophils Relative 3 %   Eosinophils Absolute 0.3  0.0 - 0.5 K/uL   Basophils Relative 1 %   Basophils Absolute 0.1 0.0 - 0.1 K/uL   Immature Granulocytes 0 %   Abs Immature Granulocytes 0.04 0.00 - 0.07 K/uL   Dg Lumbar Spine Complete  Result Date: 05/28/2018 CLINICAL DATA:  Bilateral lower extremity weakness beginning yesterday. EXAM: LUMBAR SPINE - COMPLETE 4+ VIEW COMPARISON:  02/12/2009 FINDINGS: There are 5 non rib-bearing lumbar type vertebral bodies. Normal alignment of lumbar spine. No anterolisthesis or retrolisthesis. No definite pars defects. Mild (approximately 25%) compression deformities involving the superior endplates of L1 and L3 are unchanged compared to the 02/2009 examination. Remaining lumbar vertebral body heights appear preserved Moderate multilevel lumbar spine DDD, worse at L1-L2, L2-L3 and L5-S1 with disc space height loss, endplate irregularity and small posteriorly directed disc osteophyte complexes at these locations, progressed compared to the 02/2009 examination. Limited visualization of the bilateral SI joints is normal. Atherosclerotic plaque within the abdominal aorta. Regional bowel gas pattern and soft  tissues are normal. IMPRESSION: 1. Moderate multilevel lumbar spine DDD, progressed compared to the 02/2009 examination. 2. Mild (approximately 5%) compression deformities involving the superior endplates of L1 and L2, similar to the 02/2009 examination. 3.  Aortic Atherosclerosis (ICD10-I70.0). Electronically Signed   By: Sandi Mariscal M.D.   On: 05/28/2018 10:02   Mr Lumbar Spine Wo Contrast  Result Date: 05/28/2018 CLINICAL DATA:  Numbness the right leg beginning several days ago. Numbness of the left leg also noted, but has resolved. EXAM: MRI LUMBAR SPINE WITHOUT CONTRAST TECHNIQUE: Multiplanar, multisequence MR imaging of the lumbar spine was performed. No intravenous contrast was administered. COMPARISON:  Radiography 05/28/2018.  MRI 09/12/2008. FINDINGS: Segmentation:  5 lumbar type vertebral bodies. Alignment:  Normal Vertebrae: Old minor anterior compression fracture at L1. Old minor superior endplate fracture at L3. These were acute in 2010. Discogenic edema at the posterior endplates at C7-8 and extensively affecting the vertebral bodies at L5-S1. Conus medullaris and cauda equina: Conus extends to the L1 level. Conus and cauda equina appear normal. Paraspinal and other soft tissues: Right upper pole renal cyst. Disc levels: T11-12 and T12-L1: Disc bulges. No likely neural compression. Those levels were not studied in the axial plane. L1-2: Mild disc bulge.  No compressive stenosis. L2-3: Moderate disc bulge. Mild facet and ligamentous hypertrophy. Mild stenosis of the lateral recesses, left more than right, but without definite neural compression. L3-4: Mild disc bulge.  No stenosis. L4-5: Mild disc bulge. Mild facet hypertrophy. No compressive stenosis. L5-S1: Endplate osteophytes and central to slightly left-sided disc herniation. Mild facet hypertrophy. Stenosis of the subarticular lateral recesses left more than right. Left S1 nerve compression is possible. IMPRESSION: L2-3: Moderate disc bulge.  Mild facet and ligamentous hypertrophy. Mild stenosis of the lateral recesses left more than right. Definite neural compression is not established at this level however. Discogenic endplate edema which could contribute to back pain. L5-S1: Disc degeneration with endplate osteophytes and central to left-sided disc herniation. Mild facet hypertrophy. Narrowing of the lateral recesses left more than right. Left S1 nerve compression could occur in this location. Discogenic vertebral body edema which could contribute to back pain. I do not identify right-sided predominant disease to explain the right-sided predominant symptoms. Compared to the study of 2010, findings at L5-S1 appear quite similar. The findings at L2-3 have worsened. Electronically Signed   By: Nelson Chimes M.D.   On: 05/28/2018 13:10     MDM   At shift change, MRIs are still pending. Care  signed out to Langston Masker, PA-C with plan to follow up on imaging studies and likely speak with neurology about results.        Bishop Dublin 05/28/18 2247    Jola Schmidt, MD 05/29/18 [phone]

## 2018-05-28 NOTE — ED Triage Notes (Signed)
Pt in c/o numbness to her right leg, states it started while she was sitting at a table a few nights ago and her toes went numb, then developed a numb sensation to bilateral legs from her hips down when she stood up, states the left leg resolved but the right leg has continued to feel numb, pt is able to walk on her leg and bear weight, no gait changes noted

## 2018-05-28 NOTE — Consult Note (Signed)
Requesting Physician: Coral Ceo    Chief Complaint: Bilateral leg weakness  History obtained from: Patient and Chart     HPI:                                                                                                                                       [facility]an [name] is an 63 y.o. female with past medical history of migraines, hypertension, hyperlipidemia, PE who presents to the emergency department with numbness in both legs as well as right greater than left leg weakness.  Patient states that approximately 3 days ago when she was sitting she noticed suddenly both her feet felt numb and then spread to include the entirety of both legs up to her waist.  She also felt her legs were weaker than normal and noticed difficulty walking, with the right leg being more worse than the left leg.  Her symptoms of improved and she feels well strength is also improved.  She went to the emergency room and underwent MRI L-spine and noticed she had moderate bulging of L2-L3 and L5-S1, which did not necessarily explain her symptoms.  Neurology was consulted for further evaluation.     Past Medical History:  Diagnosis Date  . Colitis, ischemic ([place]) 2003  . Headaches, cluster   . Hypercholesterolemia   . Hypertension   . Insomnia   . PE (pulmonary embolism)    ?birth control  . S/P colonoscopy [id number]   2004: tubular adeboma, 2007: hyperplastic polyps. Due 2012  . Seasonal allergies   . Tobacco abuse   . Tubular adenoma 2004   colonoscopy 2004    Past Surgical History:  Procedure Laterality Date  . BREAST BIOPSY Left    benign  . COLONOSCOPY  09/17/05   20 cm polyp removed/bx/middescending colon polyp removed with snare  . COLONOSCOPY  2003   tubular adenoma removed from cecum   . COLONOSCOPY  03/31/2011   RMR, diverticulosis, multiple colon polyps removed (tubular adenomas). next TCS 03/2016  . COLONOSCOPY N/A 10/22/2015   Procedure: COLONOSCOPY;  Surgeon: Daneil Dolin,  MD;  [facility];  Service: Endoscopy;  Laterality: N/A;  730  . ESOPHAGOGASTRODUODENOSCOPY N/A 02/15/2015   RMR, mild reflux esophagitis. empirical dilation of esophagus.   Marland Kitchen MALONEY DILATION N/A 02/15/2015   Procedure: Venia Minks DILATION;  Surgeon: Daneil Dolin, MD;  [facility]: [facility];  Service: Endoscopy;  Laterality: N/A;  . TUBAL LIGATION      Family History  Problem Relation Age of Onset  . CVA Father   . Hypertension Father    Social History:  reports that she has been smoking cigarettes. She has a 30.00 pack-year smoking history. She has never used smokeless tobacco. She reports that she does not drink alcohol or use drugs.  Allergies: No Known Allergies  Medications:  I reviewed home medications   ROS:                                                                                                                                     14 systems reviewed and negative except above    Examination:                                                                                                      General: Appears well-developed and well-nourished.  Psych: Affect appropriate to situation Eyes: No scleral injection HENT: No OP obstrucion Head: Normocephalic.  Cardiovascular: Normal rate and regular rhythm.  Respiratory: Effort normal and breath sounds normal to anterior ascultation GI: Soft.  No distension. There is no tenderness.  Skin: WDI    Neurological Examination Mental Status: Alert, oriented, thought content appropriate.  Speech fluent without evidence of aphasia. Able to follow 3 step commands without difficulty. Cranial Nerves: II: Visual fields grossly normal,  III,IV, VI: ptosis not present, extra-ocular motions intact bilaterally, pupils equal, round, reactive to light and accommodation V,VII: smile symmetric, facial light  touch sensation normal bilaterally VIII: hearing normal bilaterally IX,X: uvula rises symmetrically XI: bilateral shoulder shrug XII: midline tongue extension Motor: Right : Upper extremity   5/5    Left:     Upper extremity   5/5  Lower extremity   5/5     Lower extremity   5/5 Tone and bulk:normal tone throughout; no atrophy noted Sensory: Reduced sensation to sharp and light touch over entirety of right leg compared to left as well less right arm compared to the right leg to sharp. Deep Tendon Reflexes: 4+ reflexes over right patella and ankle, with clonus in the right leg.  Left leg is 2+ over patella and ankle.  The right upper extremity biceps and triceps reflexes are also brisk at 3+.  Left biceps and triceps reflexes 2+.  Hoffman's was negative Plantars: Right: downgoing   Left: downgoing Cerebellar: normal finger-to-nose, normal rapid alternating movements and normal heel-to-shin test Gait: normal gait and station     Lab Results: Basic Metabolic Panel: Recent Labs  Lab 05/28/18 1326  NA 136  K 3.5  CL 105  CO2 21*  GLUCOSE 97  BUN 16  CREATININE 0.61  CALCIUM 9.1    CBC: Recent Labs  Lab 05/28/18 1326  WBC 11.0*  NEUTROABS 6.5  HGB 14.8  HCT 43.4  MCV 97.5  PLT 318    Coagulation Studies: No results for  input(s): LABPROT, INR in the last 72 hours.  Imaging: Dg Lumbar Spine Complete  Result Date: 05/28/2018 CLINICAL DATA:  Bilateral lower extremity weakness beginning yesterday. EXAM: LUMBAR SPINE - COMPLETE 4+ VIEW COMPARISON:  02/12/2009 FINDINGS: There are 5 non rib-bearing lumbar type vertebral bodies. Normal alignment of lumbar spine. No anterolisthesis or retrolisthesis. No definite pars defects. Mild (approximately 25%) compression deformities involving the superior endplates of L1 and L3 are unchanged compared to the 02/2009 examination. Remaining lumbar vertebral body heights appear preserved Moderate multilevel lumbar spine DDD, worse at L1-L2,  L2-L3 and L5-S1 with disc space height loss, endplate irregularity and small posteriorly directed disc osteophyte complexes at these locations, progressed compared to the 02/2009 examination. Limited visualization of the bilateral SI joints is normal. Atherosclerotic plaque within the abdominal aorta. Regional bowel gas pattern and soft tissues are normal. IMPRESSION: 1. Moderate multilevel lumbar spine DDD, progressed compared to the 02/2009 examination. 2. Mild (approximately 5%) compression deformities involving the superior endplates of L1 and L2, similar to the 02/2009 examination. 3.  Aortic Atherosclerosis (ICD10-I70.0). Electronically Signed   By: Sandi Mariscal M.D.   On: 05/28/2018 10:02   Mr Lumbar Spine Wo Contrast  Result Date: 05/28/2018 CLINICAL DATA:  Numbness the right leg beginning several days ago. Numbness of the left leg also noted, but has resolved. EXAM: MRI LUMBAR SPINE WITHOUT CONTRAST TECHNIQUE: Multiplanar, multisequence MR imaging of the lumbar spine was performed. No intravenous contrast was administered. COMPARISON:  Radiography 05/28/2018.  MRI 09/12/2008. FINDINGS: Segmentation:  5 lumbar type vertebral bodies. Alignment:  Normal Vertebrae: Old minor anterior compression fracture at L1. Old minor superior endplate fracture at L3. These were acute in 2010. Discogenic edema at the posterior endplates at D9-8 and extensively affecting the vertebral bodies at L5-S1. Conus medullaris and cauda equina: Conus extends to the L1 level. Conus and cauda equina appear normal. Paraspinal and other soft tissues: Right upper pole renal cyst. Disc levels: T11-12 and T12-L1: Disc bulges. No likely neural compression. Those levels were not studied in the axial plane. L1-2: Mild disc bulge.  No compressive stenosis. L2-3: Moderate disc bulge. Mild facet and ligamentous hypertrophy. Mild stenosis of the lateral recesses, left more than right, but without definite neural compression. L3-4: Mild disc  bulge.  No stenosis. L4-5: Mild disc bulge. Mild facet hypertrophy. No compressive stenosis. L5-S1: Endplate osteophytes and central to slightly left-sided disc herniation. Mild facet hypertrophy. Stenosis of the subarticular lateral recesses left more than right. Left S1 nerve compression is possible. IMPRESSION: L2-3: Moderate disc bulge. Mild facet and ligamentous hypertrophy. Mild stenosis of the lateral recesses left more than right. Definite neural compression is not established at this level however. Discogenic endplate edema which could contribute to back pain. L5-S1: Disc degeneration with endplate osteophytes and central to left-sided disc herniation. Mild facet hypertrophy. Narrowing of the lateral recesses left more than right. Left S1 nerve compression could occur in this location. Discogenic vertebral body edema which could contribute to back pain. I do not identify right-sided predominant disease to explain the right-sided predominant symptoms. Compared to the study of 2010, findings at L5-S1 appear quite similar. The findings at L2-3 have worsened. Electronically Signed   By: Nelson Chimes M.D.   On: 05/28/2018 13:10     I have reviewed the above imaging : MRI L-spine   ASSESSMENT AND PLAN  63 y.o. female with past medical history of migraines, hypertension, hyperlipidemia, PE who presents to the emergency department with numbness in both legs  as well as right greater than left leg weakness, reduced sensation over the right  arm and right  leg, hyperreflexia over right upper and lower extremities with right ankle clonus.   I suspect the patient has myelopathy versus brain lesion causing patient's deficits.  Differentials include multiple sclerosis/transverse myelitis, acute stroke versus compressive myelopathy.   Right leg weakness and hyperreflexia  Suspected Cervical Myelopathy/Transverse Myelitis/Multiple sclerosis/ Acute stroke   Recommendations MRI Brain, C and T spine w/wo  contrast   Neurology will  follow.  Signed out to to Dr. Christain [place] Daniyah Fohl [facility] Pager Number [phone]

## 2018-05-28 NOTE — ED Provider Notes (Signed)
[name] Provider Note   CSN: [id number] Arrival date & time: 05/28/18  0845     History   Chief Complaint Chief Complaint  Patient presents with  . Extremity Weakness    HPI Joan [name] is a 63 y.o. female with a history of hypertension, high cholesterol, PE presenting to emergency department with chief complaint of numbness in her right leg x 3 days.  Patient reports that last night she was sitting at the kitchen table and her toes began to tingle and feel numb.  When she stood up she felt numb in bilateral legs from the hip down.  The numbness in her left leg has resolved without intervention.  However she states she still has a constant feeling of numbness in her right leg and that the leg feels very heavy.  Patient denies any associated pain.  She did not take anything for symptoms prior to arrival.  She denies history of similar pain.    Denies recent cold symptoms, back pain, neck pain, injury, urinary retention, bowel and bladder incontinence.  Past Medical History:  Diagnosis Date  . Colitis, ischemic ([place]) 2003  . Headaches, cluster   . Hypercholesterolemia   . Hypertension   . Insomnia   . PE (pulmonary embolism)    ?birth control  . S/P colonoscopy [id number]   2004: tubular adeboma, 2007: hyperplastic polyps. Due 2012  . Seasonal allergies   . Tobacco abuse   . Tubular adenoma 2004   colonoscopy 2004    Patient Active Problem List   Diagnosis Date Noted  . History of colon polyps   . Bowel habit changes 10/01/2015  . H/O adenomatous polyp of colon 10/01/2015  . Reflux esophagitis   . Dyspepsia 02/07/2015  . Dysphagia 02/07/2015  . Tubular adenoma 02/23/2011    Past Surgical History:  Procedure Laterality Date  . BREAST BIOPSY Left    benign  . COLONOSCOPY  09/17/05   20 cm polyp removed/bx/middescending colon polyp removed with snare  . COLONOSCOPY  2003   tubular adenoma removed from cecum   .  COLONOSCOPY  03/31/2011   RMR, diverticulosis, multiple colon polyps removed (tubular adenomas). next TCS 03/2016  . COLONOSCOPY N/A 10/22/2015   Procedure: COLONOSCOPY;  Surgeon: Daneil Dolin, MD;  [facility];  Service: Endoscopy;  Laterality: N/A;  730  . ESOPHAGOGASTRODUODENOSCOPY N/A 02/15/2015   RMR, mild reflux esophagitis. empirical dilation of esophagus.   Marland Kitchen MALONEY DILATION N/A 02/15/2015   Procedure: Venia Minks DILATION;  Surgeon: Daneil Dolin, MD;  [facility];  Service: Endoscopy;  Laterality: N/A;  . TUBAL LIGATION       OB History   No obstetric history on file.      Home Medications    Prior to Admission medications   Medication Sig Start Date End Date Taking? Authorizing Provider  meloxicam (MOBIC) 15 MG tablet Take 15 mg by mouth daily as needed for pain.  05/17/18  Yes [provider]  omeprazole (PRILOSEC) 20 MG capsule Take 1 capsule (20 mg total) by mouth daily. Patient taking differently: Take 20 mg by mouth daily as needed (heart burn).  10/01/15  Yes Mahala Menghini, PA-C  rosuvastatin (CRESTOR) 10 MG tablet Take 10 mg by mouth daily at 6 PM. 05/11/18  Yes [provider]  valsartan-hydrochlorothiazide (DIOVAN-HCT) 80-12.5 MG per tablet Take 1 tablet by mouth daily.     Yes [provider]    Family  History Family History  Problem Relation Age of Onset  . CVA Father   . Hypertension Father     Social History Social History   Tobacco Use  . Smoking status: Current Every Day Smoker    Packs/day: 1.00    Years: 30.00    Pack years: 30.00    Types: Cigarettes  . Smokeless tobacco: Never Used  . Tobacco comment: Is about to quit because work is disallowing smoking at the site  Substance Use Topics  . Alcohol use: No    Alcohol/week: 0.0 standard drinks  . Drug use: No     Allergies   Patient has no known allergies.   Review of Systems Review of Systems  Constitutional: Negative for chills and  fever.  HENT: Negative for congestion, sinus pressure and sore throat.   Eyes: Negative for pain and visual disturbance.  Respiratory: Negative for chest tightness and shortness of breath.   Cardiovascular: Negative for chest pain and palpitations.  Gastrointestinal: Negative for abdominal pain, diarrhea, nausea and vomiting.  Genitourinary: Negative for difficulty urinating and hematuria.  Musculoskeletal: Negative for back pain and neck pain.  Skin: Negative for rash and wound.  Neurological: Positive for numbness. Negative for dizziness, syncope, facial asymmetry and headaches.  All other systems reviewed and are negative.    Physical Exam Updated Vital Signs BP 136/82 (BP Location: Right Arm)   Pulse 80   Temp 97.7 F (36.5 C) (Oral)   Resp 16   SpO2 96%   Physical Exam Vitals signs and nursing note reviewed.  Constitutional:      Appearance: She is not ill-appearing or toxic-appearing.  HENT:     Head: Normocephalic and atraumatic.     Nose: Nose normal.     Mouth/Throat:     Mouth: Mucous membranes are moist.     Pharynx: Oropharynx is clear.  Eyes:     Conjunctiva/sclera: Conjunctivae normal.  Neck:     Musculoskeletal: Normal range of motion.  Cardiovascular:     Rate and Rhythm: Normal rate and regular rhythm.     Pulses: Normal pulses.     Heart sounds: Normal heart sounds.  Pulmonary:     Effort: Pulmonary effort is normal.     Breath sounds: Normal breath sounds.  Abdominal:     General: There is no distension.     Palpations: Abdomen is soft.     Tenderness: There is no abdominal tenderness. There is no guarding or rebound.  Musculoskeletal: Normal range of motion.  Skin:    General: Skin is warm and dry.     Capillary Refill: Capillary refill takes less than 2 seconds.  Neurological:     Mental Status: She is alert. Mental status is at baseline.     Motor: No weakness.     Comments: Speech is clear and goal oriented, follows commands CN III-XII  intact, no facial droop. Decreased strength in right lower extremity including dorsiflexion and plantar flexion. Pt has unsteady gate and balance, unable to stand on either leg. Hyperreflexia with right patellar reflex. Normal strength in upper extremities bilaterally strong and equal grip strength.  Decreased sensation normal to light and sharp touch to right lower extremity. Moves extremities without ataxia, coordination intact Normal finger to nose and rapid alternating movements.    Psychiatric:        Behavior: Behavior normal.      ED Treatments / Results  Labs (all labs ordered are listed, but only abnormal results are displayed)  Labs Reviewed  BASIC METABOLIC PANEL - Abnormal; Notable for the following components:      Result Value   CO2 21 (*)    All other components within normal limits  CBC WITH DIFFERENTIAL/PLATELET - Abnormal; Notable for the following components:   WBC 11.0 (*)    All other components within normal limits    EKG None  Radiology Dg Lumbar Spine Complete  Result Date: 05/28/2018 CLINICAL DATA:  Bilateral lower extremity weakness beginning yesterday. EXAM: LUMBAR SPINE - COMPLETE 4+ VIEW COMPARISON:  02/12/2009 FINDINGS: There are 5 non rib-bearing lumbar type vertebral bodies. Normal alignment of lumbar spine. No anterolisthesis or retrolisthesis. No definite pars defects. Mild (approximately 25%) compression deformities involving the superior endplates of L1 and L3 are unchanged compared to the 02/2009 examination. Remaining lumbar vertebral body heights appear preserved Moderate multilevel lumbar spine DDD, worse at L1-L2, L2-L3 and L5-S1 with disc space height loss, endplate irregularity and small posteriorly directed disc osteophyte complexes at these locations, progressed compared to the 02/2009 examination. Limited visualization of the bilateral SI joints is normal. Atherosclerotic plaque within the abdominal aorta. Regional bowel gas pattern and  soft tissues are normal. IMPRESSION: 1. Moderate multilevel lumbar spine DDD, progressed compared to the 02/2009 examination. 2. Mild (approximately 5%) compression deformities involving the superior endplates of L1 and L2, similar to the 02/2009 examination. 3.  Aortic Atherosclerosis (ICD10-I70.0). Electronically Signed   By: Sandi Mariscal M.D.   On: 05/28/2018 10:02   Mr Lumbar Spine Wo Contrast  Result Date: 05/28/2018 CLINICAL DATA:  Numbness the right leg beginning several days ago. Numbness of the left leg also noted, but has resolved. EXAM: MRI LUMBAR SPINE WITHOUT CONTRAST TECHNIQUE: Multiplanar, multisequence MR imaging of the lumbar spine was performed. No intravenous contrast was administered. COMPARISON:  Radiography 05/28/2018.  MRI 09/12/2008. FINDINGS: Segmentation:  5 lumbar type vertebral bodies. Alignment:  Normal Vertebrae: Old minor anterior compression fracture at L1. Old minor superior endplate fracture at L3. These were acute in 2010. Discogenic edema at the posterior endplates at U7-6 and extensively affecting the vertebral bodies at L5-S1. Conus medullaris and cauda equina: Conus extends to the L1 level. Conus and cauda equina appear normal. Paraspinal and other soft tissues: Right upper pole renal cyst. Disc levels: T11-12 and T12-L1: Disc bulges. No likely neural compression. Those levels were not studied in the axial plane. L1-2: Mild disc bulge.  No compressive stenosis. L2-3: Moderate disc bulge. Mild facet and ligamentous hypertrophy. Mild stenosis of the lateral recesses, left more than right, but without definite neural compression. L3-4: Mild disc bulge.  No stenosis. L4-5: Mild disc bulge. Mild facet hypertrophy. No compressive stenosis. L5-S1: Endplate osteophytes and central to slightly left-sided disc herniation. Mild facet hypertrophy. Stenosis of the subarticular lateral recesses left more than right. Left S1 nerve compression is possible. IMPRESSION: L2-3: Moderate disc  bulge. Mild facet and ligamentous hypertrophy. Mild stenosis of the lateral recesses left more than right. Definite neural compression is not established at this level however. Discogenic endplate edema which could contribute to back pain. L5-S1: Disc degeneration with endplate osteophytes and central to left-sided disc herniation. Mild facet hypertrophy. Narrowing of the lateral recesses left more than right. Left S1 nerve compression could occur in this location. Discogenic vertebral body edema which could contribute to back pain. I do not identify right-sided predominant disease to explain the right-sided predominant symptoms. Compared to the study of 2010, findings at L5-S1 appear quite similar. The findings at L2-3 have worsened.  Electronically Signed   By: Nelson Chimes M.D.   On: 05/28/2018 13:10    Procedures Procedures (including critical care time)  Medications Ordered in ED Medications  LORazepam (ATIVAN) tablet 1 mg (1 mg Oral Given 05/28/18 1209)     Initial Impression / Assessment and Plan / ED Course  I have reviewed the triage vital signs and the nursing notes.  Pertinent labs & imaging results that were available during my care of the patient were reviewed by me and considered in my medical decision making (see chart for details).   Patient had bilateral lower extremity weakness, the left has resolved but she continues to feel numbness in right leg. Patient's neuro exam significant for right lower extremity weakness.  She has unsteady gait and is unable to balance while standing on either foot.  She has hyperreflexia of patellar reflex R>L.  Lumbar spine x-ray shows moderate multilevel lumbar spine degenerative disc disease.  MR lumbar spine shows moderate bulging of L2-L3 and L5-S1 disc degeneration however it does now show cause of right lower extremity weakness.  Neurology consulted and Dr. Lorraine Lax will evaluate the patient.   At shift change care was transferred to [facility] MD who  will follow pending studies, re-evaulate and determine disposition.      Final Clinical Impressions(s) / ED Diagnoses   Final diagnoses:  None    ED Discharge Orders    None       Cherre Robins, PA-C 05/28/18 1601    Malvin Johns, MD 05/28/18 [phone]

## 2018-05-28 NOTE — Progress Notes (Signed)
Patient unable to continue MRI due to pain and inability to hold still.  Over an hour of scanning was done, but contrast was not given due to patient not being able to complete exam. Spoke with RN about possibility of patient getting more medication or being able to recover and come back to finish exam when ready or relaxed.

## 2018-05-28 NOTE — ED Notes (Signed)
Neuro at bedside.

## 2018-05-29 ENCOUNTER — Encounter (HOSPITAL_COMMUNITY): Payer: Self-pay | Admitting: *Deleted

## 2018-05-29 ENCOUNTER — Other Ambulatory Visit: Payer: Self-pay

## 2018-05-29 ENCOUNTER — Emergency Department (HOSPITAL_COMMUNITY): Payer: 59

## 2018-05-29 DIAGNOSIS — M1990 Unspecified osteoarthritis, unspecified site: Secondary | ICD-10-CM | POA: Diagnosis present

## 2018-05-29 DIAGNOSIS — G35 Multiple sclerosis: Secondary | ICD-10-CM

## 2018-05-29 DIAGNOSIS — E876 Hypokalemia: Secondary | ICD-10-CM | POA: Diagnosis present

## 2018-05-29 DIAGNOSIS — E783 Hyperchylomicronemia: Secondary | ICD-10-CM

## 2018-05-29 DIAGNOSIS — G959 Disease of spinal cord, unspecified: Secondary | ICD-10-CM | POA: Diagnosis not present

## 2018-05-29 DIAGNOSIS — K210 Gastro-esophageal reflux disease with esophagitis: Secondary | ICD-10-CM | POA: Diagnosis present

## 2018-05-29 DIAGNOSIS — Z8249 Family history of ischemic heart disease and other diseases of the circulatory system: Secondary | ICD-10-CM | POA: Diagnosis not present

## 2018-05-29 DIAGNOSIS — G379 Demyelinating disease of central nervous system, unspecified: Secondary | ICD-10-CM | POA: Diagnosis present

## 2018-05-29 DIAGNOSIS — Z86711 Personal history of pulmonary embolism: Secondary | ICD-10-CM | POA: Diagnosis not present

## 2018-05-29 DIAGNOSIS — Z86010 Personal history of colonic polyps: Secondary | ICD-10-CM | POA: Diagnosis not present

## 2018-05-29 DIAGNOSIS — Z720 Tobacco use: Secondary | ICD-10-CM | POA: Diagnosis present

## 2018-05-29 DIAGNOSIS — R200 Anesthesia of skin: Secondary | ICD-10-CM | POA: Diagnosis not present

## 2018-05-29 DIAGNOSIS — Z823 Family history of stroke: Secondary | ICD-10-CM | POA: Diagnosis not present

## 2018-05-29 DIAGNOSIS — Z79899 Other long term (current) drug therapy: Secondary | ICD-10-CM | POA: Diagnosis not present

## 2018-05-29 DIAGNOSIS — M153 Secondary multiple arthritis: Secondary | ICD-10-CM

## 2018-05-29 DIAGNOSIS — M5137 Other intervertebral disc degeneration, lumbosacral region: Secondary | ICD-10-CM | POA: Diagnosis present

## 2018-05-29 DIAGNOSIS — I10 Essential (primary) hypertension: Secondary | ICD-10-CM | POA: Diagnosis not present

## 2018-05-29 DIAGNOSIS — R29898 Other symptoms and signs involving the musculoskeletal system: Secondary | ICD-10-CM | POA: Diagnosis not present

## 2018-05-29 DIAGNOSIS — E559 Vitamin D deficiency, unspecified: Secondary | ICD-10-CM | POA: Diagnosis present

## 2018-05-29 DIAGNOSIS — F17210 Nicotine dependence, cigarettes, uncomplicated: Secondary | ICD-10-CM | POA: Diagnosis present

## 2018-05-29 DIAGNOSIS — E785 Hyperlipidemia, unspecified: Secondary | ICD-10-CM | POA: Diagnosis present

## 2018-05-29 DIAGNOSIS — M5126 Other intervertebral disc displacement, lumbar region: Secondary | ICD-10-CM | POA: Diagnosis present

## 2018-05-29 DIAGNOSIS — E7800 Pure hypercholesterolemia, unspecified: Secondary | ICD-10-CM | POA: Diagnosis present

## 2018-05-29 HISTORY — DX: Multiple sclerosis: G35

## 2018-05-29 LAB — CBC
HCT: 42.1 % (ref 36.0–46.0)
Hemoglobin: 14 g/dL (ref 12.0–15.0)
MCH: 32.6 pg (ref 26.0–34.0)
MCHC: 33.3 g/dL (ref 30.0–36.0)
MCV: 98.1 fL (ref 80.0–100.0)
Platelets: 302 10*3/uL (ref 150–400)
RBC: 4.29 MIL/uL (ref 3.87–5.11)
RDW: 12.2 % (ref 11.5–15.5)
WBC: 10.1 10*3/uL (ref 4.0–10.5)
nRBC: 0 % (ref 0.0–0.2)

## 2018-05-29 LAB — URINALYSIS, ROUTINE W REFLEX MICROSCOPIC
Bilirubin Urine: NEGATIVE
Glucose, UA: NEGATIVE mg/dL
Hgb urine dipstick: NEGATIVE
KETONES UR: NEGATIVE mg/dL
Leukocytes,Ua: NEGATIVE
NITRITE: NEGATIVE
Protein, ur: NEGATIVE mg/dL
Specific Gravity, Urine: 1.026 (ref 1.005–1.030)
pH: 5 (ref 5.0–8.0)

## 2018-05-29 LAB — HIV ANTIBODY (ROUTINE TESTING W REFLEX): HIV Screen 4th Generation wRfx: NONREACTIVE

## 2018-05-29 LAB — BASIC METABOLIC PANEL
Anion gap: 10 (ref 5–15)
BUN: 21 mg/dL (ref 8–23)
CO2: 22 mmol/L (ref 22–32)
Calcium: 8.9 mg/dL (ref 8.9–10.3)
Chloride: 104 mmol/L (ref 98–111)
Creatinine, Ser: 0.65 mg/dL (ref 0.44–1.00)
GFR calc Af Amer: >60 mL/min (ref >60)
Glucose, Bld: 101 mg/dL — ABNORMAL HIGH (ref 70–99)
Potassium: 3.4 mmol/L — ABNORMAL LOW (ref 3.5–5.1)
Sodium: 136 mmol/L (ref 135–145)

## 2018-05-29 LAB — TSH: TSH: 2.227 u[IU]/mL (ref 0.350–4.500)

## 2018-05-29 LAB — MAGNESIUM: Magnesium: 2.2 mg/dL (ref 1.7–2.4)

## 2018-05-29 MED ORDER — MELOXICAM 7.5 MG PO TABS
15.0000 mg | ORAL_TABLET | Freq: Every day | ORAL | Status: DC
  Filled 2018-05-29 (×3): qty 2

## 2018-05-29 MED ORDER — POTASSIUM CHLORIDE CRYS ER 20 MEQ PO TBCR
40.0000 meq | EXTENDED_RELEASE_TABLET | Freq: Once | ORAL | Status: AC
  Administered 2018-05-29: 40 meq via ORAL
  Filled 2018-05-29: qty 2

## 2018-05-29 MED ORDER — HYDROCHLOROTHIAZIDE 12.5 MG PO CAPS
12.5000 mg | ORAL_CAPSULE | Freq: Every day | ORAL | Status: DC
  Administered 2018-05-29: 12.5 mg via ORAL
  Filled 2018-05-29: qty 1

## 2018-05-29 MED ORDER — ROSUVASTATIN CALCIUM 5 MG PO TABS
10.0000 mg | ORAL_TABLET | Freq: Every day | ORAL | Status: DC
  Administered 2018-05-29 – 2018-05-30 (×2): 10 mg via ORAL
  Filled 2018-05-29 (×2): qty 2

## 2018-05-29 MED ORDER — PANTOPRAZOLE SODIUM 40 MG PO TBEC
40.0000 mg | DELAYED_RELEASE_TABLET | Freq: Every day | ORAL | Status: DC
  Administered 2018-05-29 – 2018-05-31 (×3): 40 mg via ORAL
  Filled 2018-05-29 (×3): qty 1

## 2018-05-29 MED ORDER — NICOTINE 14 MG/24HR TD PT24
14.0000 mg | MEDICATED_PATCH | Freq: Every day | TRANSDERMAL | Status: DC
  Administered 2018-05-29 – 2018-05-31 (×3): 14 mg via TRANSDERMAL
  Filled 2018-05-29 (×3): qty 1

## 2018-05-29 MED ORDER — IRBESARTAN 75 MG PO TABS
75.0000 mg | ORAL_TABLET | Freq: Every day | ORAL | Status: DC
  Administered 2018-05-29 – 2018-05-31 (×2): 75 mg via ORAL
  Filled 2018-05-29 (×3): qty 1

## 2018-05-29 MED ORDER — PANTOPRAZOLE SODIUM 40 MG PO TBEC: 40.0000 mg | DELAYED_RELEASE_TABLET | Freq: Every day | ORAL | Status: DC

## 2018-05-29 MED ORDER — VALSARTAN-HYDROCHLOROTHIAZIDE 80-12.5 MG PO TABS: 1.0000 | ORAL_TABLET | Freq: Every day | ORAL | Status: DC

## 2018-05-29 MED ORDER — SODIUM CHLORIDE 0.9 % IV SOLN
1000.0000 mg | Freq: Every day | INTRAVENOUS | Status: DC
  Administered 2018-05-29 – 2018-05-31 (×3): 1000 mg via INTRAVENOUS
  Filled 2018-05-29 (×3): qty 8

## 2018-05-29 NOTE — Plan of Care (Signed)
Alert and oriented X4. Denies pain but reports numbness to the lower extremities. Skin warm and dry. Pt has been oriented to the room. Call bell within reach. No distress noted at this time except lower extremities weakness. Will continue monitoring the patient.

## 2018-05-29 NOTE — Progress Notes (Addendum)
Reason for consult: Multiple sclerosis  Subjective: Patient feels slightly better when compared to yesterday.  Still has numbness and slight weakness in the left side.   ROS: negative except above  Examination  Vital signs in last 24 hours: Temp:  [97.4 F (36.3 C)-98.3 F (36.8 C)] 98.3 F (36.8 C) (02/16 2003) Pulse Rate:  [76-108] 108 (02/16 2003) Resp:  [16-20] 18 (02/16 2003) BP: (105-118)/(63-69) 111/67 (02/16 2003) SpO2:  [93 %-97 %] 93 % (02/16 2003) Weight:  [87.4 kg] 87.4 kg (02/16 0231)  General: sitting up in bed CVS: pulse-normal rate and rhythm RS: breathing comfortably Extremities: normal   Neuro: MS: Alert, oriented, follows commands CN: pupils equal and reactive,  EOMI, face symmetric, tongue midline, normal sensation over face, Motor: 5/5 strength in all 4 extremities Reflexes:plantars: flexor Coordination: normal Gait: not tested  Basic Metabolic Panel: Recent Labs  Lab 05/28/18 1326 05/29/18 0655  NA 136 136  K 3.5 3.4*  CL 105 104  CO2 21* 22  GLUCOSE 97 101*  BUN 16 21  CREATININE 0.61 0.65  CALCIUM 9.1 8.9  MG  --  2.2    CBC: Recent Labs  Lab 05/28/18 1326 05/29/18 0655  WBC 11.0* 10.1  NEUTROABS 6.5  --   HGB 14.8 14.0  HCT 43.4 42.1  MCV 97.5 98.1  PLT 318 302     Coagulation Studies: No results for input(s): LABPROT, INR in the last 72 hours.  Imaging Reviewed:  MRI brain C and T-spine: Shows nonenhancing lesions that are periventricular in the brain consistent with MS as well as cervical spine lesion   ASSESSMENT AND PLAN  63 year old female with past medical history of migraines, hypertension, hyperlipidemia, PE who presents to the emergency department with numbness in both legs as well as right greater than left leg weakness, reduced sensation over the right  arm and right  leg, hyperreflexia over right upper and lower extremities with right ankle clonus.   MRI brain shows multiple T2 flair hyperintensities both  supratentorial and infratentorial chest of of MS as well as a single cord lesion in the C4-C5 level.  Based on radiological and clinical criteria satisfies diagnosis of MS.   Multiple Sclerosis- new diagnosis   -Solu-Medrol 1 g IV for 3 days -Watch blood sugars and blood pressure -PPI -PT OT evaluation -Check vitamin D levels -We will need to arrange follow-up with Dr. Felecia Shelling, [place] specialist to start disease modifying therapy   Neurology will be available as needed.  Please call back with any questions  Karena Addison  [facility] Pager Number [phone] For questions after 7pm please refer to AMION to reach the Neurologist on call

## 2018-05-29 NOTE — H&P (Addendum)
History and Physical  Joan [name] XTK:[id number] DOB: [birth year] DOA: 05/28/2018  Referring physician:Murray, Mikey Kirschner, PA-C PCP: Celene Squibb, MD  Outpatient Specialists:  Patient coming from: Home & is able to ambulate   Chief Complaint: Bilateral lower extremity to weakness and numbness for 3 days  HPI: Joan [name] is a 63 y.o. female with medical history significant for colitis ischemic colitis headache hypercholesterolemia hypertension insomnia pulmonary embolism, tobacco abuse tubular adenoma status post colonoscopy 2004 2007 who presented to the emergency room with complaints of heaviness in both legs for 3 days as well as numbness.  Symptoms started suddenly as she was sitting down she noticed that both of her legs were heavy with numbness up to do hip area now it is mostly on the right side there is no numbness on the left and on the right side she feels that the toes are getting better.  She denies any shortness of breath headache nausea vomiting fever or dizziness MRI of the lumbar spine was done and it L4-5 and L5-S1 disc disease but that is not contributing to her symptomatology.  Neurology was consulted MRI of the lumbar spine of the brain has been ordered as well as the cervical and thoracic spine MRI.     ED Course: ED doc consulted with neurology who advised to admit to medicine and they will follow her up in the immediate medication was not started the most likely start in the morning after evaluation there was started on steroids IV they requested taking for UA to rule out any infection as well as chest x-ray rule out any infection the ED physician Dr. Malen Gauze did not think that a lumbar puncture was indicated at this time   Review of Systems:  . Pt complains of numbness to both feet and weakness and heaviness  Pt denies any headache, dizziness, blurry vision speech difficulty nausea vomiting.  Review of systems are otherwise negative   Past Medical History:    Diagnosis Date  . Colitis, ischemic ([place]) 2003  . Headaches, cluster   . Hypercholesterolemia   . Hypertension   . Insomnia   . PE (pulmonary embolism)    ?birth control  . S/P colonoscopy [id number]   2004: tubular adeboma, 2007: hyperplastic polyps. Due 2012  . Seasonal allergies   . Tobacco abuse   . Tubular adenoma 2004   colonoscopy 2004   Past Surgical History:  Procedure Laterality Date  . BREAST BIOPSY Left    benign  . COLONOSCOPY  09/17/05   20 cm polyp removed/bx/middescending colon polyp removed with snare  . COLONOSCOPY  2003   tubular adenoma removed from cecum   . COLONOSCOPY  03/31/2011   RMR, diverticulosis, multiple colon polyps removed (tubular adenomas). next TCS 03/2016  . COLONOSCOPY N/A 10/22/2015   Procedure: COLONOSCOPY;  Surgeon: Daneil Dolin, MD;  Location: [facility];  Service: Endoscopy;  Laterality: N/A;  730  . ESOPHAGOGASTRODUODENOSCOPY N/A 02/15/2015   RMR, mild reflux esophagitis. empirical dilation of esophagus.   Marland Kitchen MALONEY DILATION N/A 02/15/2015   Procedure: Venia Minks DILATION;  Surgeon: Daneil Dolin, MD;  Location: [facility];  Service: Endoscopy;  Laterality: N/A;  . TUBAL LIGATION      Social History:  reports that she has been smoking cigarettes. She has a 30.00 pack-year smoking history. She has never used smokeless tobacco. She reports that she does not drink alcohol or use drugs.   No Known Allergies  Family History  Problem  Relation Age of Onset  . CVA Father   . Hypertension Father       Prior to Admission medications   Medication Sig Start Date End Date Taking? Authorizing Provider  meloxicam (MOBIC) 15 MG tablet Take 15 mg by mouth daily as needed for pain.  05/17/18  Yes [provider]  omeprazole (PRILOSEC) 20 MG capsule Take 1 capsule (20 mg total) by mouth daily. Patient taking differently: Take 20 mg by mouth daily as needed (heart burn).  10/01/15  Yes Mahala Menghini, PA-C  rosuvastatin (CRESTOR) 10  MG tablet Take 10 mg by mouth daily at 6 PM. 05/11/18  Yes [provider]  valsartan-hydrochlorothiazide (DIOVAN-HCT) 80-12.5 MG per tablet Take 1 tablet by mouth daily.     Yes [provider]    Physical Exam: BP 118/69 (BP Location: Left Arm)   Pulse 86   Temp 97.7 F (36.5 C) (Oral)   Resp 16   SpO2 96%   Exam:  . General: 63 y.o. year-old female well developed well nourished in no acute distress.  Alert and oriented x3. . Cardiovascular: Regular rate and rhythm with no rubs or gallops.  No thyromegaly or JVD noted.   Marland Kitchen Respiratory: Clear to auscultation with no wheezes or rales. Good inspiratory effort. . Abdomen: Soft nontender nondistended with normal bowel sounds x4 quadrants. . Musculoskeletal: No lower extremity edema. 2/4 pulses in all 4 extremities. . Skin: No ulcerative lesions noted or rashes, . Psychiatry: Mood is appropriate for condition and setting . Neurology: Weakness of the right leg.  Paresthesia of the right leg but some improvement in the toe and foot area hyperreflexia of the patella reflexes more on the right           Labs on Admission:  Basic Metabolic Panel: Recent Labs  Lab 05/28/18 1326  NA 136  K 3.5  CL 105  CO2 21*  GLUCOSE 97  BUN 16  CREATININE 0.61  CALCIUM 9.1   Liver Function Tests: No results for input(s): AST, ALT, ALKPHOS, BILITOT, PROT, ALBUMIN in the last 168 hours. No results for input(s): LIPASE, AMYLASE in the last 168 hours. No results for input(s): AMMONIA in the last 168 hours. CBC: Recent Labs  Lab 05/28/18 1326  WBC 11.0*  NEUTROABS 6.5  HGB 14.8  HCT 43.4  MCV 97.5  PLT 318   Cardiac Enzymes: No results for input(s): CKTOTAL, CKMB, CKMBINDEX, TROPONINI in the last 168 hours.  BNP (last 3 results) No results for input(s): BNP in the last 8760 hours.  ProBNP (last 3 results) No results for input(s): PROBNP in the last 8760 hours.  CBG: No results for input(s): GLUCAP in the last 168  hours.  Radiological Exams on Admission: Dg Chest 2 View  Result Date: 05/29/2018 CLINICAL DATA:  Numbness of bilateral legs EXAM: CHEST - 2 VIEW COMPARISON:  May 31, 2014 FINDINGS: The heart size and mediastinal contours are within normal limits. Both lungs are clear. The visualized skeletal structures are stable. IMPRESSION: No active cardiopulmonary disease. Electronically Signed   By: Abelardo Diesel M.D.   On: 05/29/2018 00:51   Dg Lumbar Spine Complete  Result Date: 05/28/2018 CLINICAL DATA:  Bilateral lower extremity weakness beginning yesterday. EXAM: LUMBAR SPINE - COMPLETE 4+ VIEW COMPARISON:  02/12/2009 FINDINGS: There are 5 non rib-bearing lumbar type vertebral bodies. Normal alignment of lumbar spine. No anterolisthesis or retrolisthesis. No definite pars defects. Mild (approximately 25%) compression deformities involving the superior endplates of L1 and  L3 are unchanged compared to the 02/2009 examination. Remaining lumbar vertebral body heights appear preserved Moderate multilevel lumbar spine DDD, worse at L1-L2, L2-L3 and L5-S1 with disc space height loss, endplate irregularity and small posteriorly directed disc osteophyte complexes at these locations, progressed compared to the 02/2009 examination. Limited visualization of the bilateral SI joints is normal. Atherosclerotic plaque within the abdominal aorta. Regional bowel gas pattern and soft tissues are normal. IMPRESSION: 1. Moderate multilevel lumbar spine DDD, progressed compared to the 02/2009 examination. 2. Mild (approximately 5%) compression deformities involving the superior endplates of L1 and L2, similar to the 02/2009 examination. 3.  Aortic Atherosclerosis (ICD10-I70.0). Electronically Signed   By: Sandi Mariscal M.D.   On: 05/28/2018 10:02   Mr Jeri Cos VO Contrast  Result Date: 05/28/2018 CLINICAL DATA:  Initial evaluation for acute bilateral leg weakness. EXAM: MRI HEAD WITHOUT AND WITH CONTRAST MRI CERVICAL SPINE  WITHOUT AND WITH CONTRAST MRI THORACIC SPINE WITHOUT AND WITH CONTRAST TECHNIQUE: Multiplanar, multiecho pulse sequences of the brain and surrounding structures, and cervical spine, to include the craniocervical junction and cervicothoracic junction, as well as the thoracic spine were obtained without and with intravenous contrast. CONTRAST:  9 cc of Gadavist. COMPARISON:  Prior thoracic MRI from 09/12/2008. FINDINGS: MRI HEAD FINDINGS Brain: Cerebral volume within normal limits for age. Multifocal patchy T2/FLAIR hyperintensities seen involving the periventricular and deep white matter of both cerebral hemispheres. Several of these foci are oriented perpendicular to the lateral ventricles. Involvement of the temporal lobe white matter on the right. Few scattered black hole seen on T1 weighted sequence. Few small infratentorial foci in noted, most notable at the left pons. Overall, findings are highly suspicious for demyelinating disease/multiple sclerosis. No associated diffusion abnormality or abnormal enhancement to suggest active demyelination at this time. Punctate focus of nodular enhancement within the left cerebellum on axial postcontrast image 16 favored to be vascular in nature. No evidence for acute or subacute infarct. Gray-white matter differentiation otherwise maintained. No encephalomalacia to suggest chronic infarction. No foci of susceptibility artifact to suggest acute or chronic intracranial hemorrhage. No mass lesion, midline shift or mass effect. No hydrocephalus. No extra-axial fluid collection. Pituitary gland suprasellar region normal. Midline structures intact and normal. Vascular: Major intracranial vascular flow voids are well maintained and normal in appearance. Skull and upper cervical spine: Craniocervical junction normal. Bone marrow signal intensity within normal limits. No scalp soft tissue abnormality. Sinuses/Orbits: Globes and orbital soft tissues within normal limits. Scattered  mucosal thickening throughout the ethmoidal air cells and maxillary sinuses. No air-fluid level to suggest acute sinusitis. Mild fluid opacity noted with inferior right middle ear cavity. No mastoid effusion. Inner ear structures normal. Other: None. MRI CERVICAL SPINE FINDINGS Alignment: Straightening with slight reversal of the normal cervical lordosis. Trace anterolisthesis of C3 on C4, with trace retrolisthesis of C5 on C6, likely chronic and degenerative. Vertebrae: Vertebral body heights maintained without evidence for acute or chronic fracture. Bone marrow signal intensity within normal limits. Few small hemangiomas noted within the C3 and C7 vertebral bodies. No worrisome osseous lesions. Minimal reactive endplate changes present about the C5-6 and C6-7 interspaces. Mild edema and enhancement about the right C3-4 facet due to facet arthritis. Cord: Small focus of cord signal abnormality within the right hemi cord at the level of C5 (series 23, image 24). Findings suspicious for possible demyelinating disease. No abnormal cord expansion or enhancement to suggest active demyelination. No other definite cord signal abnormality. Posterior Fossa, vertebral arteries, paraspinal tissues:  Paraspinous and prevertebral soft tissues within normal limits. Normal intravascular enhancement seen within the vertebral arteries bilaterally. Disc levels: C2-C3: Left-sided uncovertebral spurring. No significant spinal stenosis. Foramina remain patent. C3-C4: Trace anterolisthesis. Advanced right-sided facet hypertrophy. No significant spinal stenosis. Moderate right with mild left C4 foraminal narrowing. C4-C5: Mild disc bulge with uncovertebral hypertrophy. Mild bilateral facet hypertrophy. Flattening of the ventral CSF without significant spinal stenosis. Mild bilateral C5 foraminal narrowing. C5-C6: Chronic diffuse degenerative disc osteophyte with intervertebral disc space narrowing. Disc osteophyte slightly eccentric to  the left. Broad posterior component indents and mildly effaces the ventral thecal sac. Resultant mild spinal stenosis without cord deformity. Moderate bilateral C6 foraminal narrowing, left greater than right. C6-C7: Diffuse degenerative disc osteophyte with intervertebral disc space narrowing. Flattening and partial effacement of the ventral CSF with resultant mild spinal stenosis. No cord deformity. Moderate bilateral C7 foraminal stenosis, left slightly worse than right. C7-T1: Mild right-sided facet hypertrophy. No significant stenosis. MRI CERVICAL SPINE FINDINGS Alignment: Vertebral bodies normally aligned with preservation of the normal thoracic kyphosis. No listhesis. Vertebrae: Mild chronic L1 compression fracture, stable from previous. Mild chronic height loss at the T12 vertebral body also unchanged. Scattered chronic endplate Schmorl's nodes noted throughout the mid and lower thoracic spine. Vertebral body heights otherwise maintained without evidence for acute or chronic fracture. Underlying bone marrow signal intensity within normal limits. Few small benign hemangiomas noted. No worrisome osseous lesions. No abnormal marrow edema or enhancement. Cord: Focus of cord signal abnormality seen within the left dorsal cord at the level of T8-9 (series 46, image 28), favored to be related to demyelinating disease, although a degree of compressive myelopathy related to adjacent T8-9 disc osteophyte could be contributory. No other abnormal cord signal abnormality. Mild prominence of the central canal noted within the lower thoracic spinal cord. No abnormal enhancement. Paraspinal soft tissues: Paraspinous soft tissues within normal limits. Partially visualized lungs are clear. 3.4 cm exophytic right renal cyst noted. Visualized visceral structures otherwise unremarkable. Disc levels: T1-2:  Unremarkable. T2-3: Mild diffuse disc bulge.  No stenosis. T3-4:  Minimal disc bulge.  No stenosis. T4-5:  Unremarkable.  T5-6: Left paracentral disc protrusion mildly indents the left ventral thecal sac. No stenosis or cord impingement. T6-7: Right paracentral disc protrusion mildly indents the right ventral thecal sac. Mild flattening of the right hemi cord without cord signal changes. No significant stenosis. T7-8:  Mild disc bulge without significant stenosis. T8-9: Moderate sized central disc osteophyte indents the ventral thecal sac, impinging upon the ventral spinal cord (series 46, image 26). Moderate spinal stenosis. No significant foraminal encroachment. T9-10: Central/right paracentral disc osteophyte indents the ventral thecal sac. Secondary mild flattening of the ventral spinal cord with mild spinal stenosis. Foramina remain patent. T10-11: Central/right paracentral disc osteophyte indents the ventral thecal sac. Mild spinal stenosis without significant cord deformity. Mild facet hypertrophy. No significant foraminal encroachment. T11-12: Mild diffuse disc bulge. Mild facet hypertrophy. No significant stenosis. T12-L1:  Mild diffuse disc bulge.  No significant stenosis. IMPRESSION: MRI HEAD IMPRESSION: 1. Multifocal T2/FLAIR hyperintensities involving the supratentorial and infratentorial cerebral white matter as above in a distribution highly suspicious for demyelinating disease/multiple sclerosis. No evidence for active demyelination at this time. 2. Otherwise normal brain MRI. No other acute intracranial abnormality. MRI CERVICAL SPINE IMPRESSION: 1. Single subtle focus of cord signal abnormality within the right hemi cord at the level of C4-5, suspicious for demyelinating disease. No abnormal enhancement to suggest active demyelination. 2. Multilevel cervical spondylolysis with resultant mild  spinal stenosis at C5-6 and C6-7. 3. Multifactorial degenerative changes with resultant multilevel foraminal narrowing as above. Notable findings include moderate right C4 and bilateral C6 and C7 foraminal stenosis. MRI THORACIC  SPINE IMPRESSION: 1. Focal cord signal abnormality within the left dorsal cord at the level of T8-9, favored to be related to demyelinating disease, although a degree of compressive myelopathy could be considered due to adjacent prominent disc osteophyte at T8-9. No abnormal enhancement to suggest demyelinating disease. No other cord signal abnormality. 2. Multilevel disc osteophytes at T8-9 through T10-11 with resultant mild to moderate diffuse spinal stenosis, most pronounced at T8-9. 3. Additional non-compressive disc bulging elsewhere within the thoracic spine as detailed above. No other significant spinal stenosis. Electronically Signed   By: Jeannine Boga M.D.   On: 05/28/2018 23:53   Mr Lumbar Spine Wo Contrast  Result Date: 05/28/2018 CLINICAL DATA:  Numbness the right leg beginning several days ago. Numbness of the left leg also noted, but has resolved. EXAM: MRI LUMBAR SPINE WITHOUT CONTRAST TECHNIQUE: Multiplanar, multisequence MR imaging of the lumbar spine was performed. No intravenous contrast was administered. COMPARISON:  Radiography 05/28/2018.  MRI 09/12/2008. FINDINGS: Segmentation:  5 lumbar type vertebral bodies. Alignment:  Normal Vertebrae: Old minor anterior compression fracture at L1. Old minor superior endplate fracture at L3. These were acute in 2010. Discogenic edema at the posterior endplates at X5-2 and extensively affecting the vertebral bodies at L5-S1. Conus medullaris and cauda equina: Conus extends to the L1 level. Conus and cauda equina appear normal. Paraspinal and other soft tissues: Right upper pole renal cyst. Disc levels: T11-12 and T12-L1: Disc bulges. No likely neural compression. Those levels were not studied in the axial plane. L1-2: Mild disc bulge.  No compressive stenosis. L2-3: Moderate disc bulge. Mild facet and ligamentous hypertrophy. Mild stenosis of the lateral recesses, left more than right, but without definite neural compression. L3-4: Mild disc  bulge.  No stenosis. L4-5: Mild disc bulge. Mild facet hypertrophy. No compressive stenosis. L5-S1: Endplate osteophytes and central to slightly left-sided disc herniation. Mild facet hypertrophy. Stenosis of the subarticular lateral recesses left more than right. Left S1 nerve compression is possible. IMPRESSION: L2-3: Moderate disc bulge. Mild facet and ligamentous hypertrophy. Mild stenosis of the lateral recesses left more than right. Definite neural compression is not established at this level however. Discogenic endplate edema which could contribute to back pain. L5-S1: Disc degeneration with endplate osteophytes and central to left-sided disc herniation. Mild facet hypertrophy. Narrowing of the lateral recesses left more than right. Left S1 nerve compression could occur in this location. Discogenic vertebral body edema which could contribute to back pain. I do not identify right-sided predominant disease to explain the right-sided predominant symptoms. Compared to the study of 2010, findings at L5-S1 appear quite similar. The findings at L2-3 have worsened. Electronically Signed   By: Nelson Chimes M.D.   On: 05/28/2018 13:10   Mr Cervical Spine W Wo Contrast  Result Date: 05/28/2018 CLINICAL DATA:  Initial evaluation for acute bilateral leg weakness. EXAM: MRI HEAD WITHOUT AND WITH CONTRAST MRI CERVICAL SPINE WITHOUT AND WITH CONTRAST MRI THORACIC SPINE WITHOUT AND WITH CONTRAST TECHNIQUE: Multiplanar, multiecho pulse sequences of the brain and surrounding structures, and cervical spine, to include the craniocervical junction and cervicothoracic junction, as well as the thoracic spine were obtained without and with intravenous contrast. CONTRAST:  9 cc of Gadavist. COMPARISON:  Prior thoracic MRI from 09/12/2008. FINDINGS: MRI HEAD FINDINGS Brain: Cerebral volume within normal limits  for age. Multifocal patchy T2/FLAIR hyperintensities seen involving the periventricular and deep white matter of both  cerebral hemispheres. Several of these foci are oriented perpendicular to the lateral ventricles. Involvement of the temporal lobe white matter on the right. Few scattered black hole seen on T1 weighted sequence. Few small infratentorial foci in noted, most notable at the left pons. Overall, findings are highly suspicious for demyelinating disease/multiple sclerosis. No associated diffusion abnormality or abnormal enhancement to suggest active demyelination at this time. Punctate focus of nodular enhancement within the left cerebellum on axial postcontrast image 16 favored to be vascular in nature. No evidence for acute or subacute infarct. Gray-white matter differentiation otherwise maintained. No encephalomalacia to suggest chronic infarction. No foci of susceptibility artifact to suggest acute or chronic intracranial hemorrhage. No mass lesion, midline shift or mass effect. No hydrocephalus. No extra-axial fluid collection. Pituitary gland suprasellar region normal. Midline structures intact and normal. Vascular: Major intracranial vascular flow voids are well maintained and normal in appearance. Skull and upper cervical spine: Craniocervical junction normal. Bone marrow signal intensity within normal limits. No scalp soft tissue abnormality. Sinuses/Orbits: Globes and orbital soft tissues within normal limits. Scattered mucosal thickening throughout the ethmoidal air cells and maxillary sinuses. No air-fluid level to suggest acute sinusitis. Mild fluid opacity noted with inferior right middle ear cavity. No mastoid effusion. Inner ear structures normal. Other: None. MRI CERVICAL SPINE FINDINGS Alignment: Straightening with slight reversal of the normal cervical lordosis. Trace anterolisthesis of C3 on C4, with trace retrolisthesis of C5 on C6, likely chronic and degenerative. Vertebrae: Vertebral body heights maintained without evidence for acute or chronic fracture. Bone marrow signal intensity within normal  limits. Few small hemangiomas noted within the C3 and C7 vertebral bodies. No worrisome osseous lesions. Minimal reactive endplate changes present about the C5-6 and C6-7 interspaces. Mild edema and enhancement about the right C3-4 facet due to facet arthritis. Cord: Small focus of cord signal abnormality within the right hemi cord at the level of C5 (series 23, image 24). Findings suspicious for possible demyelinating disease. No abnormal cord expansion or enhancement to suggest active demyelination. No other definite cord signal abnormality. Posterior Fossa, vertebral arteries, paraspinal tissues: Paraspinous and prevertebral soft tissues within normal limits. Normal intravascular enhancement seen within the vertebral arteries bilaterally. Disc levels: C2-C3: Left-sided uncovertebral spurring. No significant spinal stenosis. Foramina remain patent. C3-C4: Trace anterolisthesis. Advanced right-sided facet hypertrophy. No significant spinal stenosis. Moderate right with mild left C4 foraminal narrowing. C4-C5: Mild disc bulge with uncovertebral hypertrophy. Mild bilateral facet hypertrophy. Flattening of the ventral CSF without significant spinal stenosis. Mild bilateral C5 foraminal narrowing. C5-C6: Chronic diffuse degenerative disc osteophyte with intervertebral disc space narrowing. Disc osteophyte slightly eccentric to the left. Broad posterior component indents and mildly effaces the ventral thecal sac. Resultant mild spinal stenosis without cord deformity. Moderate bilateral C6 foraminal narrowing, left greater than right. C6-C7: Diffuse degenerative disc osteophyte with intervertebral disc space narrowing. Flattening and partial effacement of the ventral CSF with resultant mild spinal stenosis. No cord deformity. Moderate bilateral C7 foraminal stenosis, left slightly worse than right. C7-T1: Mild right-sided facet hypertrophy. No significant stenosis. MRI CERVICAL SPINE FINDINGS Alignment: Vertebral bodies  normally aligned with preservation of the normal thoracic kyphosis. No listhesis. Vertebrae: Mild chronic L1 compression fracture, stable from previous. Mild chronic height loss at the T12 vertebral body also unchanged. Scattered chronic endplate Schmorl's nodes noted throughout the mid and lower thoracic spine. Vertebral body heights otherwise maintained without evidence for acute  or chronic fracture. Underlying bone marrow signal intensity within normal limits. Few small benign hemangiomas noted. No worrisome osseous lesions. No abnormal marrow edema or enhancement. Cord: Focus of cord signal abnormality seen within the left dorsal cord at the level of T8-9 (series 46, image 28), favored to be related to demyelinating disease, although a degree of compressive myelopathy related to adjacent T8-9 disc osteophyte could be contributory. No other abnormal cord signal abnormality. Mild prominence of the central canal noted within the lower thoracic spinal cord. No abnormal enhancement. Paraspinal soft tissues: Paraspinous soft tissues within normal limits. Partially visualized lungs are clear. 3.4 cm exophytic right renal cyst noted. Visualized visceral structures otherwise unremarkable. Disc levels: T1-2:  Unremarkable. T2-3: Mild diffuse disc bulge.  No stenosis. T3-4:  Minimal disc bulge.  No stenosis. T4-5:  Unremarkable. T5-6: Left paracentral disc protrusion mildly indents the left ventral thecal sac. No stenosis or cord impingement. T6-7: Right paracentral disc protrusion mildly indents the right ventral thecal sac. Mild flattening of the right hemi cord without cord signal changes. No significant stenosis. T7-8:  Mild disc bulge without significant stenosis. T8-9: Moderate sized central disc osteophyte indents the ventral thecal sac, impinging upon the ventral spinal cord (series 46, image 26). Moderate spinal stenosis. No significant foraminal encroachment. T9-10: Central/right paracentral disc osteophyte  indents the ventral thecal sac. Secondary mild flattening of the ventral spinal cord with mild spinal stenosis. Foramina remain patent. T10-11: Central/right paracentral disc osteophyte indents the ventral thecal sac. Mild spinal stenosis without significant cord deformity. Mild facet hypertrophy. No significant foraminal encroachment. T11-12: Mild diffuse disc bulge. Mild facet hypertrophy. No significant stenosis. T12-L1:  Mild diffuse disc bulge.  No significant stenosis. IMPRESSION: MRI HEAD IMPRESSION: 1. Multifocal T2/FLAIR hyperintensities involving the supratentorial and infratentorial cerebral white matter as above in a distribution highly suspicious for demyelinating disease/multiple sclerosis. No evidence for active demyelination at this time. 2. Otherwise normal brain MRI. No other acute intracranial abnormality. MRI CERVICAL SPINE IMPRESSION: 1. Single subtle focus of cord signal abnormality within the right hemi cord at the level of C4-5, suspicious for demyelinating disease. No abnormal enhancement to suggest active demyelination. 2. Multilevel cervical spondylolysis with resultant mild spinal stenosis at C5-6 and C6-7. 3. Multifactorial degenerative changes with resultant multilevel foraminal narrowing as above. Notable findings include moderate right C4 and bilateral C6 and C7 foraminal stenosis. MRI THORACIC SPINE IMPRESSION: 1. Focal cord signal abnormality within the left dorsal cord at the level of T8-9, favored to be related to demyelinating disease, although a degree of compressive myelopathy could be considered due to adjacent prominent disc osteophyte at T8-9. No abnormal enhancement to suggest demyelinating disease. No other cord signal abnormality. 2. Multilevel disc osteophytes at T8-9 through T10-11 with resultant mild to moderate diffuse spinal stenosis, most pronounced at T8-9. 3. Additional non-compressive disc bulging elsewhere within the thoracic spine as detailed above. No other  significant spinal stenosis. Electronically Signed   By: Jeannine Boga M.D.   On: 05/28/2018 23:53   Mr Thoracic Spine W Wo Contrast  Result Date: 05/28/2018 CLINICAL DATA:  Initial evaluation for acute bilateral leg weakness. EXAM: MRI HEAD WITHOUT AND WITH CONTRAST MRI CERVICAL SPINE WITHOUT AND WITH CONTRAST MRI THORACIC SPINE WITHOUT AND WITH CONTRAST TECHNIQUE: Multiplanar, multiecho pulse sequences of the brain and surrounding structures, and cervical spine, to include the craniocervical junction and cervicothoracic junction, as well as the thoracic spine were obtained without and with intravenous contrast. CONTRAST:  9 cc of Gadavist. COMPARISON:  Prior thoracic MRI from 09/12/2008. FINDINGS: MRI HEAD FINDINGS Brain: Cerebral volume within normal limits for age. Multifocal patchy T2/FLAIR hyperintensities seen involving the periventricular and deep white matter of both cerebral hemispheres. Several of these foci are oriented perpendicular to the lateral ventricles. Involvement of the temporal lobe white matter on the right. Few scattered black hole seen on T1 weighted sequence. Few small infratentorial foci in noted, most notable at the left pons. Overall, findings are highly suspicious for demyelinating disease/multiple sclerosis. No associated diffusion abnormality or abnormal enhancement to suggest active demyelination at this time. Punctate focus of nodular enhancement within the left cerebellum on axial postcontrast image 16 favored to be vascular in nature. No evidence for acute or subacute infarct. Gray-white matter differentiation otherwise maintained. No encephalomalacia to suggest chronic infarction. No foci of susceptibility artifact to suggest acute or chronic intracranial hemorrhage. No mass lesion, midline shift or mass effect. No hydrocephalus. No extra-axial fluid collection. Pituitary gland suprasellar region normal. Midline structures intact and normal. Vascular: Major  intracranial vascular flow voids are well maintained and normal in appearance. Skull and upper cervical spine: Craniocervical junction normal. Bone marrow signal intensity within normal limits. No scalp soft tissue abnormality. Sinuses/Orbits: Globes and orbital soft tissues within normal limits. Scattered mucosal thickening throughout the ethmoidal air cells and maxillary sinuses. No air-fluid level to suggest acute sinusitis. Mild fluid opacity noted with inferior right middle ear cavity. No mastoid effusion. Inner ear structures normal. Other: None. MRI CERVICAL SPINE FINDINGS Alignment: Straightening with slight reversal of the normal cervical lordosis. Trace anterolisthesis of C3 on C4, with trace retrolisthesis of C5 on C6, likely chronic and degenerative. Vertebrae: Vertebral body heights maintained without evidence for acute or chronic fracture. Bone marrow signal intensity within normal limits. Few small hemangiomas noted within the C3 and C7 vertebral bodies. No worrisome osseous lesions. Minimal reactive endplate changes present about the C5-6 and C6-7 interspaces. Mild edema and enhancement about the right C3-4 facet due to facet arthritis. Cord: Small focus of cord signal abnormality within the right hemi cord at the level of C5 (series 23, image 24). Findings suspicious for possible demyelinating disease. No abnormal cord expansion or enhancement to suggest active demyelination. No other definite cord signal abnormality. Posterior Fossa, vertebral arteries, paraspinal tissues: Paraspinous and prevertebral soft tissues within normal limits. Normal intravascular enhancement seen within the vertebral arteries bilaterally. Disc levels: C2-C3: Left-sided uncovertebral spurring. No significant spinal stenosis. Foramina remain patent. C3-C4: Trace anterolisthesis. Advanced right-sided facet hypertrophy. No significant spinal stenosis. Moderate right with mild left C4 foraminal narrowing. C4-C5: Mild disc  bulge with uncovertebral hypertrophy. Mild bilateral facet hypertrophy. Flattening of the ventral CSF without significant spinal stenosis. Mild bilateral C5 foraminal narrowing. C5-C6: Chronic diffuse degenerative disc osteophyte with intervertebral disc space narrowing. Disc osteophyte slightly eccentric to the left. Broad posterior component indents and mildly effaces the ventral thecal sac. Resultant mild spinal stenosis without cord deformity. Moderate bilateral C6 foraminal narrowing, left greater than right. C6-C7: Diffuse degenerative disc osteophyte with intervertebral disc space narrowing. Flattening and partial effacement of the ventral CSF with resultant mild spinal stenosis. No cord deformity. Moderate bilateral C7 foraminal stenosis, left slightly worse than right. C7-T1: Mild right-sided facet hypertrophy. No significant stenosis. MRI CERVICAL SPINE FINDINGS Alignment: Vertebral bodies normally aligned with preservation of the normal thoracic kyphosis. No listhesis. Vertebrae: Mild chronic L1 compression fracture, stable from previous. Mild chronic height loss at the T12 vertebral body also unchanged. Scattered chronic endplate Schmorl's nodes noted throughout the  mid and lower thoracic spine. Vertebral body heights otherwise maintained without evidence for acute or chronic fracture. Underlying bone marrow signal intensity within normal limits. Few small benign hemangiomas noted. No worrisome osseous lesions. No abnormal marrow edema or enhancement. Cord: Focus of cord signal abnormality seen within the left dorsal cord at the level of T8-9 (series 46, image 28), favored to be related to demyelinating disease, although a degree of compressive myelopathy related to adjacent T8-9 disc osteophyte could be contributory. No other abnormal cord signal abnormality. Mild prominence of the central canal noted within the lower thoracic spinal cord. No abnormal enhancement. Paraspinal soft tissues: Paraspinous  soft tissues within normal limits. Partially visualized lungs are clear. 3.4 cm exophytic right renal cyst noted. Visualized visceral structures otherwise unremarkable. Disc levels: T1-2:  Unremarkable. T2-3: Mild diffuse disc bulge.  No stenosis. T3-4:  Minimal disc bulge.  No stenosis. T4-5:  Unremarkable. T5-6: Left paracentral disc protrusion mildly indents the left ventral thecal sac. No stenosis or cord impingement. T6-7: Right paracentral disc protrusion mildly indents the right ventral thecal sac. Mild flattening of the right hemi cord without cord signal changes. No significant stenosis. T7-8:  Mild disc bulge without significant stenosis. T8-9: Moderate sized central disc osteophyte indents the ventral thecal sac, impinging upon the ventral spinal cord (series 46, image 26). Moderate spinal stenosis. No significant foraminal encroachment. T9-10: Central/right paracentral disc osteophyte indents the ventral thecal sac. Secondary mild flattening of the ventral spinal cord with mild spinal stenosis. Foramina remain patent. T10-11: Central/right paracentral disc osteophyte indents the ventral thecal sac. Mild spinal stenosis without significant cord deformity. Mild facet hypertrophy. No significant foraminal encroachment. T11-12: Mild diffuse disc bulge. Mild facet hypertrophy. No significant stenosis. T12-L1:  Mild diffuse disc bulge.  No significant stenosis. IMPRESSION: MRI HEAD IMPRESSION: 1. Multifocal T2/FLAIR hyperintensities involving the supratentorial and infratentorial cerebral white matter as above in a distribution highly suspicious for demyelinating disease/multiple sclerosis. No evidence for active demyelination at this time. 2. Otherwise normal brain MRI. No other acute intracranial abnormality. MRI CERVICAL SPINE IMPRESSION: 1. Single subtle focus of cord signal abnormality within the right hemi cord at the level of C4-5, suspicious for demyelinating disease. No abnormal enhancement to suggest  active demyelination. 2. Multilevel cervical spondylolysis with resultant mild spinal stenosis at C5-6 and C6-7. 3. Multifactorial degenerative changes with resultant multilevel foraminal narrowing as above. Notable findings include moderate right C4 and bilateral C6 and C7 foraminal stenosis. MRI THORACIC SPINE IMPRESSION: 1. Focal cord signal abnormality within the left dorsal cord at the level of T8-9, favored to be related to demyelinating disease, although a degree of compressive myelopathy could be considered due to adjacent prominent disc osteophyte at T8-9. No abnormal enhancement to suggest demyelinating disease. No other cord signal abnormality. 2. Multilevel disc osteophytes at T8-9 through T10-11 with resultant mild to moderate diffuse spinal stenosis, most pronounced at T8-9. 3. Additional non-compressive disc bulging elsewhere within the thoracic spine as detailed above. No other significant spinal stenosis. Electronically Signed   By: Jeannine Boga M.D.   On: 05/28/2018 23:53    EKG: Independently reviewed.  None  Assessment/Plan Present on Admission: . Tobacco abuse . Weakness of both lower extremities . Hyperlipidemia . Essential hypertension . DJD (degenerative joint disease)  Active Problems:   H/O adenomatous polyp of colon   Tobacco abuse   Weakness of both lower extremities   Hyperlipidemia   Essential hypertension   DJD (degenerative joint disease)  1.  Bilateral lower extremity  weakness with paresthesia neurology consulted and testing still ongoing they will follow-up in the morning.  Did not think that lumbar puncture is indicated.  They will also decide in the morning if she needs high-dose steroid the working diagnosis include cyst or possible cervical myelopathy transverse myelitis multiple sclerosis or acute stroke  2.  Tobacco abuse we will start her on nicotine replacement  3.  Hypercholesterolemia continue rosuvastatin  4.  Degenerative change joint  disease continue meloxicam  5.  Essential hypertension continue valsartan/hydrochlorothiazide   5.  Hypertension  Severity of Illness: The appropriate patient status for this patient is OBSERVATION. Observation status is judged to be reasonable and necessary in order to provide the required intensity of service to ensure the patient's safety. The patient's presenting symptoms, physical exam findings, and initial radiographic and laboratory data in the context of their medical condition is felt to place them at decreased risk for further clinical deterioration. Furthermore, it is anticipated that the patient will be medically stable for discharge from the hospital within 2 midnights of admission. The following factors support the patient status of observation.   " The patient's presenting symptoms include numbness to both lower extremity. " The physical exam findings include hyperreflexia and numbness paresthesia to the right lower extremity. " The initial radiographic and laboratory data are MRI of the lumbar spine shows moderate bulging of L2-L3 and L5-S1     DVT prophylaxis: SCD  Code Status: Full  Family Communication: Louie Casa son at bedside with his wife  Disposition Plan: Home when stable  Consults called: Neurology Dr. Malen Gauze by ED doc  Admission status: Observation    Cristal Deer MD [facility] Pager [phone]  If 7PM-7AM, please contact night-coverage [email or web address] Password [email or web address]  05/29/2018, 1:56 AM

## 2018-05-29 NOTE — Progress Notes (Signed)
[facility] PROGRESS NOTE  Joan [name] NWG:[id number] DOB: 1955-07-21 DOA: 05/28/2018 PCP: Celene Squibb, MD  Assessment/Plan:  1. Multiple sclerosis. MRI brain/spine with results concerning for demyelinating disease. Evaluated by neurology who recommend high dose steroids for now and remainder of plan pending. Symptoms improving -solumedrol per neuro -defer further management to neuro  2.  Tobacco abuse. -cessation counseling offered - nicotine replacement  3.  Hypercholesterolemia continue rosuvastatin  4.  Degenerative change joint disease continue meloxicam  5.  Essential hypertension. Good control - continue valsartan/hydrochlorothiazide  6. Hypokalemia. Mild. Likely related to HCTZ -replete -recheck   Code Status: full Family Communication: none present Disposition Plan: home   Consultants:  [facility]  Procedures:    Antibiotics:    HPI/Subjective: 63 yo hx colitis, htn, PE, smoker, tubular adenoma presented 05/28/18 with complaints of heaviness of both legs as well as numbness and weakness right greater than left. MRI concerning for multiple sclerosis. High dose steroids started per neuro team.   Objective: Vitals:   05/29/18 0933 05/29/18 1141  BP: 114/63 111/65  Pulse: 82 90  Resp: 16 18  Temp: (!) 97.4 F (36.3 C) 98 F (36.7 C)  SpO2: 97% 97%    Intake/Output Summary (Last 24 hours) at 05/29/2018 1400 Last data filed at 05/29/2018 1311 Gross per 24 hour  Intake 365 ml  Output 650 ml  Net -285 ml   Filed Weights   05/29/18 0231  Weight: 87.4 kg    Exam:   General:  Sitting up in bed watching tv no acute distress  Cardiovascular: rrr no mgr no LE edema  Respiratory: normal effort BS clear bilaterally no wheeze  Abdomen: non-distended non-tender +BS no guarding or rebounding  Musculoskeletal: joints without swelling/erythema  Neuro: awake alert oriented x3. Speech clear facial symmetry LE strength 5/5  bilaterally. Sensation left greater than right   Data Reviewed: Basic Metabolic Panel: Recent Labs  Lab 05/28/18 1326 05/29/18 0655  NA 136 136  K 3.5 3.4*  CL 105 104  CO2 21* 22  GLUCOSE 97 101*  BUN 16 21  CREATININE 0.61 0.65  CALCIUM 9.1 8.9   Liver Function Tests: No results for input(s): AST, ALT, ALKPHOS, BILITOT, PROT, ALBUMIN in the last 168 hours. No results for input(s): LIPASE, AMYLASE in the last 168 hours. No results for input(s): AMMONIA in the last 168 hours. CBC: Recent Labs  Lab 05/28/18 1326 05/29/18 0655  WBC 11.0* 10.1  NEUTROABS 6.5  --   HGB 14.8 14.0  HCT 43.4 42.1  MCV 97.5 98.1  PLT 318 302   Cardiac Enzymes: No results for input(s): CKTOTAL, CKMB, CKMBINDEX, TROPONINI in the last 168 hours. BNP (last 3 results) No results for input(s): BNP in the last 8760 hours.  ProBNP (last 3 results) No results for input(s): PROBNP in the last 8760 hours.  CBG: No results for input(s): GLUCAP in the last 168 hours.  No results found for this or any previous visit (from the past 240 hour(s)).   Studies: Dg Chest 2 View  Result Date: 05/29/2018 CLINICAL DATA:  Numbness of bilateral legs EXAM: CHEST - 2 VIEW COMPARISON:  May 31, 2014 FINDINGS: The heart size and mediastinal contours are within normal limits. Both lungs are clear. The visualized skeletal structures are stable. IMPRESSION: No active cardiopulmonary disease. Electronically Signed   By: Abelardo Diesel M.D.   On: 05/29/2018 00:51   Dg Lumbar Spine Complete  Result Date: 05/28/2018 CLINICAL DATA:  Bilateral lower  extremity weakness beginning yesterday. EXAM: LUMBAR SPINE - COMPLETE 4+ VIEW COMPARISON:  02/12/2009 FINDINGS: There are 5 non rib-bearing lumbar type vertebral bodies. Normal alignment of lumbar spine. No anterolisthesis or retrolisthesis. No definite pars defects. Mild (approximately 25%) compression deformities involving the superior endplates of L1 and L3 are unchanged  compared to the 02/2009 examination. Remaining lumbar vertebral body heights appear preserved Moderate multilevel lumbar spine DDD, worse at L1-L2, L2-L3 and L5-S1 with disc space height loss, endplate irregularity and small posteriorly directed disc osteophyte complexes at these locations, progressed compared to the 02/2009 examination. Limited visualization of the bilateral SI joints is normal. Atherosclerotic plaque within the abdominal aorta. Regional bowel gas pattern and soft tissues are normal. IMPRESSION: 1. Moderate multilevel lumbar spine DDD, progressed compared to the 02/2009 examination. 2. Mild (approximately 5%) compression deformities involving the superior endplates of L1 and L2, similar to the 02/2009 examination. 3.  Aortic Atherosclerosis (ICD10-I70.0). Electronically Signed   By: Sandi Mariscal M.D.   On: 05/28/2018 10:02   Mr Jeri Cos TK Contrast  Result Date: 05/28/2018 CLINICAL DATA:  Initial evaluation for acute bilateral leg weakness. EXAM: MRI HEAD WITHOUT AND WITH CONTRAST MRI CERVICAL SPINE WITHOUT AND WITH CONTRAST MRI THORACIC SPINE WITHOUT AND WITH CONTRAST TECHNIQUE: Multiplanar, multiecho pulse sequences of the brain and surrounding structures, and cervical spine, to include the craniocervical junction and cervicothoracic junction, as well as the thoracic spine were obtained without and with intravenous contrast. CONTRAST:  9 cc of Gadavist. COMPARISON:  Prior thoracic MRI from 09/12/2008. FINDINGS: MRI HEAD FINDINGS Brain: Cerebral volume within normal limits for age. Multifocal patchy T2/FLAIR hyperintensities seen involving the periventricular and deep white matter of both cerebral hemispheres. Several of these foci are oriented perpendicular to the lateral ventricles. Involvement of the temporal lobe white matter on the right. Few scattered black hole seen on T1 weighted sequence. Few small infratentorial foci in noted, most notable at the left pons. Overall, findings are highly  suspicious for demyelinating disease/multiple sclerosis. No associated diffusion abnormality or abnormal enhancement to suggest active demyelination at this time. Punctate focus of nodular enhancement within the left cerebellum on axial postcontrast image 16 favored to be vascular in nature. No evidence for acute or subacute infarct. Gray-white matter differentiation otherwise maintained. No encephalomalacia to suggest chronic infarction. No foci of susceptibility artifact to suggest acute or chronic intracranial hemorrhage. No mass lesion, midline shift or mass effect. No hydrocephalus. No extra-axial fluid collection. Pituitary gland suprasellar region normal. Midline structures intact and normal. Vascular: Major intracranial vascular flow voids are well maintained and normal in appearance. Skull and upper cervical spine: Craniocervical junction normal. Bone marrow signal intensity within normal limits. No scalp soft tissue abnormality. Sinuses/Orbits: Globes and orbital soft tissues within normal limits. Scattered mucosal thickening throughout the ethmoidal air cells and maxillary sinuses. No air-fluid level to suggest acute sinusitis. Mild fluid opacity noted with inferior right middle ear cavity. No mastoid effusion. Inner ear structures normal. Other: None. MRI CERVICAL SPINE FINDINGS Alignment: Straightening with slight reversal of the normal cervical lordosis. Trace anterolisthesis of C3 on C4, with trace retrolisthesis of C5 on C6, likely chronic and degenerative. Vertebrae: Vertebral body heights maintained without evidence for acute or chronic fracture. Bone marrow signal intensity within normal limits. Few small hemangiomas noted within the C3 and C7 vertebral bodies. No worrisome osseous lesions. Minimal reactive endplate changes present about the C5-6 and C6-7 interspaces. Mild edema and enhancement about the right C3-4 facet due to facet arthritis.  Cord: Small focus of cord signal abnormality within  the right hemi cord at the level of C5 (series 23, image 24). Findings suspicious for possible demyelinating disease. No abnormal cord expansion or enhancement to suggest active demyelination. No other definite cord signal abnormality. Posterior Fossa, vertebral arteries, paraspinal tissues: Paraspinous and prevertebral soft tissues within normal limits. Normal intravascular enhancement seen within the vertebral arteries bilaterally. Disc levels: C2-C3: Left-sided uncovertebral spurring. No significant spinal stenosis. Foramina remain patent. C3-C4: Trace anterolisthesis. Advanced right-sided facet hypertrophy. No significant spinal stenosis. Moderate right with mild left C4 foraminal narrowing. C4-C5: Mild disc bulge with uncovertebral hypertrophy. Mild bilateral facet hypertrophy. Flattening of the ventral CSF without significant spinal stenosis. Mild bilateral C5 foraminal narrowing. C5-C6: Chronic diffuse degenerative disc osteophyte with intervertebral disc space narrowing. Disc osteophyte slightly eccentric to the left. Broad posterior component indents and mildly effaces the ventral thecal sac. Resultant mild spinal stenosis without cord deformity. Moderate bilateral C6 foraminal narrowing, left greater than right. C6-C7: Diffuse degenerative disc osteophyte with intervertebral disc space narrowing. Flattening and partial effacement of the ventral CSF with resultant mild spinal stenosis. No cord deformity. Moderate bilateral C7 foraminal stenosis, left slightly worse than right. C7-T1: Mild right-sided facet hypertrophy. No significant stenosis. MRI CERVICAL SPINE FINDINGS Alignment: Vertebral bodies normally aligned with preservation of the normal thoracic kyphosis. No listhesis. Vertebrae: Mild chronic L1 compression fracture, stable from previous. Mild chronic height loss at the T12 vertebral body also unchanged. Scattered chronic endplate Schmorl's nodes noted throughout the mid and lower thoracic spine.  Vertebral body heights otherwise maintained without evidence for acute or chronic fracture. Underlying bone marrow signal intensity within normal limits. Few small benign hemangiomas noted. No worrisome osseous lesions. No abnormal marrow edema or enhancement. Cord: Focus of cord signal abnormality seen within the left dorsal cord at the level of T8-9 (series 46, image 28), favored to be related to demyelinating disease, although a degree of compressive myelopathy related to adjacent T8-9 disc osteophyte could be contributory. No other abnormal cord signal abnormality. Mild prominence of the central canal noted within the lower thoracic spinal cord. No abnormal enhancement. Paraspinal soft tissues: Paraspinous soft tissues within normal limits. Partially visualized lungs are clear. 3.4 cm exophytic right renal cyst noted. Visualized visceral structures otherwise unremarkable. Disc levels: T1-2:  Unremarkable. T2-3: Mild diffuse disc bulge.  No stenosis. T3-4:  Minimal disc bulge.  No stenosis. T4-5:  Unremarkable. T5-6: Left paracentral disc protrusion mildly indents the left ventral thecal sac. No stenosis or cord impingement. T6-7: Right paracentral disc protrusion mildly indents the right ventral thecal sac. Mild flattening of the right hemi cord without cord signal changes. No significant stenosis. T7-8:  Mild disc bulge without significant stenosis. T8-9: Moderate sized central disc osteophyte indents the ventral thecal sac, impinging upon the ventral spinal cord (series 46, image 26). Moderate spinal stenosis. No significant foraminal encroachment. T9-10: Central/right paracentral disc osteophyte indents the ventral thecal sac. Secondary mild flattening of the ventral spinal cord with mild spinal stenosis. Foramina remain patent. T10-11: Central/right paracentral disc osteophyte indents the ventral thecal sac. Mild spinal stenosis without significant cord deformity. Mild facet hypertrophy. No significant  foraminal encroachment. T11-12: Mild diffuse disc bulge. Mild facet hypertrophy. No significant stenosis. T12-L1:  Mild diffuse disc bulge.  No significant stenosis. IMPRESSION: MRI HEAD IMPRESSION: 1. Multifocal T2/FLAIR hyperintensities involving the supratentorial and infratentorial cerebral white matter as above in a distribution highly suspicious for demyelinating disease/multiple sclerosis. No evidence for active demyelination at this time.  2. Otherwise normal brain MRI. No other acute intracranial abnormality. MRI CERVICAL SPINE IMPRESSION: 1. Single subtle focus of cord signal abnormality within the right hemi cord at the level of C4-5, suspicious for demyelinating disease. No abnormal enhancement to suggest active demyelination. 2. Multilevel cervical spondylolysis with resultant mild spinal stenosis at C5-6 and C6-7. 3. Multifactorial degenerative changes with resultant multilevel foraminal narrowing as above. Notable findings include moderate right C4 and bilateral C6 and C7 foraminal stenosis. MRI THORACIC SPINE IMPRESSION: 1. Focal cord signal abnormality within the left dorsal cord at the level of T8-9, favored to be related to demyelinating disease, although a degree of compressive myelopathy could be considered due to adjacent prominent disc osteophyte at T8-9. No abnormal enhancement to suggest demyelinating disease. No other cord signal abnormality. 2. Multilevel disc osteophytes at T8-9 through T10-11 with resultant mild to moderate diffuse spinal stenosis, most pronounced at T8-9. 3. Additional non-compressive disc bulging elsewhere within the thoracic spine as detailed above. No other significant spinal stenosis. Electronically Signed   By: Jeannine Boga M.D.   On: 05/28/2018 23:53   Mr Lumbar Spine Wo Contrast  Result Date: 05/28/2018 CLINICAL DATA:  Numbness the right leg beginning several days ago. Numbness of the left leg also noted, but has resolved. EXAM: MRI LUMBAR SPINE  WITHOUT CONTRAST TECHNIQUE: Multiplanar, multisequence MR imaging of the lumbar spine was performed. No intravenous contrast was administered. COMPARISON:  Radiography 05/28/2018.  MRI 09/12/2008. FINDINGS: Segmentation:  5 lumbar type vertebral bodies. Alignment:  Normal Vertebrae: Old minor anterior compression fracture at L1. Old minor superior endplate fracture at L3. These were acute in 2010. Discogenic edema at the posterior endplates at H8-5 and extensively affecting the vertebral bodies at L5-S1. Conus medullaris and cauda equina: Conus extends to the L1 level. Conus and cauda equina appear normal. Paraspinal and other soft tissues: Right upper pole renal cyst. Disc levels: T11-12 and T12-L1: Disc bulges. No likely neural compression. Those levels were not studied in the axial plane. L1-2: Mild disc bulge.  No compressive stenosis. L2-3: Moderate disc bulge. Mild facet and ligamentous hypertrophy. Mild stenosis of the lateral recesses, left more than right, but without definite neural compression. L3-4: Mild disc bulge.  No stenosis. L4-5: Mild disc bulge. Mild facet hypertrophy. No compressive stenosis. L5-S1: Endplate osteophytes and central to slightly left-sided disc herniation. Mild facet hypertrophy. Stenosis of the subarticular lateral recesses left more than right. Left S1 nerve compression is possible. IMPRESSION: L2-3: Moderate disc bulge. Mild facet and ligamentous hypertrophy. Mild stenosis of the lateral recesses left more than right. Definite neural compression is not established at this level however. Discogenic endplate edema which could contribute to back pain. L5-S1: Disc degeneration with endplate osteophytes and central to left-sided disc herniation. Mild facet hypertrophy. Narrowing of the lateral recesses left more than right. Left S1 nerve compression could occur in this location. Discogenic vertebral body edema which could contribute to back pain. I do not identify right-sided  predominant disease to explain the right-sided predominant symptoms. Compared to the study of 2010, findings at L5-S1 appear quite similar. The findings at L2-3 have worsened. Electronically Signed   By: Nelson Chimes M.D.   On: 05/28/2018 13:10   Mr Cervical Spine W Wo Contrast  Result Date: 05/28/2018 CLINICAL DATA:  Initial evaluation for acute bilateral leg weakness. EXAM: MRI HEAD WITHOUT AND WITH CONTRAST MRI CERVICAL SPINE WITHOUT AND WITH CONTRAST MRI THORACIC SPINE WITHOUT AND WITH CONTRAST TECHNIQUE: Multiplanar, multiecho pulse sequences of the brain  and surrounding structures, and cervical spine, to include the craniocervical junction and cervicothoracic junction, as well as the thoracic spine were obtained without and with intravenous contrast. CONTRAST:  9 cc of Gadavist. COMPARISON:  Prior thoracic MRI from 09/12/2008. FINDINGS: MRI HEAD FINDINGS Brain: Cerebral volume within normal limits for age. Multifocal patchy T2/FLAIR hyperintensities seen involving the periventricular and deep white matter of both cerebral hemispheres. Several of these foci are oriented perpendicular to the lateral ventricles. Involvement of the temporal lobe white matter on the right. Few scattered black hole seen on T1 weighted sequence. Few small infratentorial foci in noted, most notable at the left pons. Overall, findings are highly suspicious for demyelinating disease/multiple sclerosis. No associated diffusion abnormality or abnormal enhancement to suggest active demyelination at this time. Punctate focus of nodular enhancement within the left cerebellum on axial postcontrast image 16 favored to be vascular in nature. No evidence for acute or subacute infarct. Gray-white matter differentiation otherwise maintained. No encephalomalacia to suggest chronic infarction. No foci of susceptibility artifact to suggest acute or chronic intracranial hemorrhage. No mass lesion, midline shift or mass effect. No hydrocephalus. No  extra-axial fluid collection. Pituitary gland suprasellar region normal. Midline structures intact and normal. Vascular: Major intracranial vascular flow voids are well maintained and normal in appearance. Skull and upper cervical spine: Craniocervical junction normal. Bone marrow signal intensity within normal limits. No scalp soft tissue abnormality. Sinuses/Orbits: Globes and orbital soft tissues within normal limits. Scattered mucosal thickening throughout the ethmoidal air cells and maxillary sinuses. No air-fluid level to suggest acute sinusitis. Mild fluid opacity noted with inferior right middle ear cavity. No mastoid effusion. Inner ear structures normal. Other: None. MRI CERVICAL SPINE FINDINGS Alignment: Straightening with slight reversal of the normal cervical lordosis. Trace anterolisthesis of C3 on C4, with trace retrolisthesis of C5 on C6, likely chronic and degenerative. Vertebrae: Vertebral body heights maintained without evidence for acute or chronic fracture. Bone marrow signal intensity within normal limits. Few small hemangiomas noted within the C3 and C7 vertebral bodies. No worrisome osseous lesions. Minimal reactive endplate changes present about the C5-6 and C6-7 interspaces. Mild edema and enhancement about the right C3-4 facet due to facet arthritis. Cord: Small focus of cord signal abnormality within the right hemi cord at the level of C5 (series 23, image 24). Findings suspicious for possible demyelinating disease. No abnormal cord expansion or enhancement to suggest active demyelination. No other definite cord signal abnormality. Posterior Fossa, vertebral arteries, paraspinal tissues: Paraspinous and prevertebral soft tissues within normal limits. Normal intravascular enhancement seen within the vertebral arteries bilaterally. Disc levels: C2-C3: Left-sided uncovertebral spurring. No significant spinal stenosis. Foramina remain patent. C3-C4: Trace anterolisthesis. Advanced right-sided  facet hypertrophy. No significant spinal stenosis. Moderate right with mild left C4 foraminal narrowing. C4-C5: Mild disc bulge with uncovertebral hypertrophy. Mild bilateral facet hypertrophy. Flattening of the ventral CSF without significant spinal stenosis. Mild bilateral C5 foraminal narrowing. C5-C6: Chronic diffuse degenerative disc osteophyte with intervertebral disc space narrowing. Disc osteophyte slightly eccentric to the left. Broad posterior component indents and mildly effaces the ventral thecal sac. Resultant mild spinal stenosis without cord deformity. Moderate bilateral C6 foraminal narrowing, left greater than right. C6-C7: Diffuse degenerative disc osteophyte with intervertebral disc space narrowing. Flattening and partial effacement of the ventral CSF with resultant mild spinal stenosis. No cord deformity. Moderate bilateral C7 foraminal stenosis, left slightly worse than right. C7-T1: Mild right-sided facet hypertrophy. No significant stenosis. MRI CERVICAL SPINE FINDINGS Alignment: Vertebral bodies normally aligned with preservation  of the normal thoracic kyphosis. No listhesis. Vertebrae: Mild chronic L1 compression fracture, stable from previous. Mild chronic height loss at the T12 vertebral body also unchanged. Scattered chronic endplate Schmorl's nodes noted throughout the mid and lower thoracic spine. Vertebral body heights otherwise maintained without evidence for acute or chronic fracture. Underlying bone marrow signal intensity within normal limits. Few small benign hemangiomas noted. No worrisome osseous lesions. No abnormal marrow edema or enhancement. Cord: Focus of cord signal abnormality seen within the left dorsal cord at the level of T8-9 (series 46, image 28), favored to be related to demyelinating disease, although a degree of compressive myelopathy related to adjacent T8-9 disc osteophyte could be contributory. No other abnormal cord signal abnormality. Mild prominence of the  central canal noted within the lower thoracic spinal cord. No abnormal enhancement. Paraspinal soft tissues: Paraspinous soft tissues within normal limits. Partially visualized lungs are clear. 3.4 cm exophytic right renal cyst noted. Visualized visceral structures otherwise unremarkable. Disc levels: T1-2:  Unremarkable. T2-3: Mild diffuse disc bulge.  No stenosis. T3-4:  Minimal disc bulge.  No stenosis. T4-5:  Unremarkable. T5-6: Left paracentral disc protrusion mildly indents the left ventral thecal sac. No stenosis or cord impingement. T6-7: Right paracentral disc protrusion mildly indents the right ventral thecal sac. Mild flattening of the right hemi cord without cord signal changes. No significant stenosis. T7-8:  Mild disc bulge without significant stenosis. T8-9: Moderate sized central disc osteophyte indents the ventral thecal sac, impinging upon the ventral spinal cord (series 46, image 26). Moderate spinal stenosis. No significant foraminal encroachment. T9-10: Central/right paracentral disc osteophyte indents the ventral thecal sac. Secondary mild flattening of the ventral spinal cord with mild spinal stenosis. Foramina remain patent. T10-11: Central/right paracentral disc osteophyte indents the ventral thecal sac. Mild spinal stenosis without significant cord deformity. Mild facet hypertrophy. No significant foraminal encroachment. T11-12: Mild diffuse disc bulge. Mild facet hypertrophy. No significant stenosis. T12-L1:  Mild diffuse disc bulge.  No significant stenosis. IMPRESSION: MRI HEAD IMPRESSION: 1. Multifocal T2/FLAIR hyperintensities involving the supratentorial and infratentorial cerebral white matter as above in a distribution highly suspicious for demyelinating disease/multiple sclerosis. No evidence for active demyelination at this time. 2. Otherwise normal brain MRI. No other acute intracranial abnormality. MRI CERVICAL SPINE IMPRESSION: 1. Single subtle focus of cord signal abnormality  within the right hemi cord at the level of C4-5, suspicious for demyelinating disease. No abnormal enhancement to suggest active demyelination. 2. Multilevel cervical spondylolysis with resultant mild spinal stenosis at C5-6 and C6-7. 3. Multifactorial degenerative changes with resultant multilevel foraminal narrowing as above. Notable findings include moderate right C4 and bilateral C6 and C7 foraminal stenosis. MRI THORACIC SPINE IMPRESSION: 1. Focal cord signal abnormality within the left dorsal cord at the level of T8-9, favored to be related to demyelinating disease, although a degree of compressive myelopathy could be considered due to adjacent prominent disc osteophyte at T8-9. No abnormal enhancement to suggest demyelinating disease. No other cord signal abnormality. 2. Multilevel disc osteophytes at T8-9 through T10-11 with resultant mild to moderate diffuse spinal stenosis, most pronounced at T8-9. 3. Additional non-compressive disc bulging elsewhere within the thoracic spine as detailed above. No other significant spinal stenosis. Electronically Signed   By: Jeannine Boga M.D.   On: 05/28/2018 23:53   Mr Thoracic Spine W Wo Contrast  Result Date: 05/28/2018 CLINICAL DATA:  Initial evaluation for acute bilateral leg weakness. EXAM: MRI HEAD WITHOUT AND WITH CONTRAST MRI CERVICAL SPINE WITHOUT AND WITH CONTRAST MRI  THORACIC SPINE WITHOUT AND WITH CONTRAST TECHNIQUE: Multiplanar, multiecho pulse sequences of the brain and surrounding structures, and cervical spine, to include the craniocervical junction and cervicothoracic junction, as well as the thoracic spine were obtained without and with intravenous contrast. CONTRAST:  9 cc of Gadavist. COMPARISON:  Prior thoracic MRI from 09/12/2008. FINDINGS: MRI HEAD FINDINGS Brain: Cerebral volume within normal limits for age. Multifocal patchy T2/FLAIR hyperintensities seen involving the periventricular and deep white matter of both cerebral  hemispheres. Several of these foci are oriented perpendicular to the lateral ventricles. Involvement of the temporal lobe white matter on the right. Few scattered black hole seen on T1 weighted sequence. Few small infratentorial foci in noted, most notable at the left pons. Overall, findings are highly suspicious for demyelinating disease/multiple sclerosis. No associated diffusion abnormality or abnormal enhancement to suggest active demyelination at this time. Punctate focus of nodular enhancement within the left cerebellum on axial postcontrast image 16 favored to be vascular in nature. No evidence for acute or subacute infarct. Gray-white matter differentiation otherwise maintained. No encephalomalacia to suggest chronic infarction. No foci of susceptibility artifact to suggest acute or chronic intracranial hemorrhage. No mass lesion, midline shift or mass effect. No hydrocephalus. No extra-axial fluid collection. Pituitary gland suprasellar region normal. Midline structures intact and normal. Vascular: Major intracranial vascular flow voids are well maintained and normal in appearance. Skull and upper cervical spine: Craniocervical junction normal. Bone marrow signal intensity within normal limits. No scalp soft tissue abnormality. Sinuses/Orbits: Globes and orbital soft tissues within normal limits. Scattered mucosal thickening throughout the ethmoidal air cells and maxillary sinuses. No air-fluid level to suggest acute sinusitis. Mild fluid opacity noted with inferior right middle ear cavity. No mastoid effusion. Inner ear structures normal. Other: None. MRI CERVICAL SPINE FINDINGS Alignment: Straightening with slight reversal of the normal cervical lordosis. Trace anterolisthesis of C3 on C4, with trace retrolisthesis of C5 on C6, likely chronic and degenerative. Vertebrae: Vertebral body heights maintained without evidence for acute or chronic fracture. Bone marrow signal intensity within normal limits. Few  small hemangiomas noted within the C3 and C7 vertebral bodies. No worrisome osseous lesions. Minimal reactive endplate changes present about the C5-6 and C6-7 interspaces. Mild edema and enhancement about the right C3-4 facet due to facet arthritis. Cord: Small focus of cord signal abnormality within the right hemi cord at the level of C5 (series 23, image 24). Findings suspicious for possible demyelinating disease. No abnormal cord expansion or enhancement to suggest active demyelination. No other definite cord signal abnormality. Posterior Fossa, vertebral arteries, paraspinal tissues: Paraspinous and prevertebral soft tissues within normal limits. Normal intravascular enhancement seen within the vertebral arteries bilaterally. Disc levels: C2-C3: Left-sided uncovertebral spurring. No significant spinal stenosis. Foramina remain patent. C3-C4: Trace anterolisthesis. Advanced right-sided facet hypertrophy. No significant spinal stenosis. Moderate right with mild left C4 foraminal narrowing. C4-C5: Mild disc bulge with uncovertebral hypertrophy. Mild bilateral facet hypertrophy. Flattening of the ventral CSF without significant spinal stenosis. Mild bilateral C5 foraminal narrowing. C5-C6: Chronic diffuse degenerative disc osteophyte with intervertebral disc space narrowing. Disc osteophyte slightly eccentric to the left. Broad posterior component indents and mildly effaces the ventral thecal sac. Resultant mild spinal stenosis without cord deformity. Moderate bilateral C6 foraminal narrowing, left greater than right. C6-C7: Diffuse degenerative disc osteophyte with intervertebral disc space narrowing. Flattening and partial effacement of the ventral CSF with resultant mild spinal stenosis. No cord deformity. Moderate bilateral C7 foraminal stenosis, left slightly worse than right. C7-T1: Mild right-sided facet hypertrophy.  No significant stenosis. MRI CERVICAL SPINE FINDINGS Alignment: Vertebral bodies normally  aligned with preservation of the normal thoracic kyphosis. No listhesis. Vertebrae: Mild chronic L1 compression fracture, stable from previous. Mild chronic height loss at the T12 vertebral body also unchanged. Scattered chronic endplate Schmorl's nodes noted throughout the mid and lower thoracic spine. Vertebral body heights otherwise maintained without evidence for acute or chronic fracture. Underlying bone marrow signal intensity within normal limits. Few small benign hemangiomas noted. No worrisome osseous lesions. No abnormal marrow edema or enhancement. Cord: Focus of cord signal abnormality seen within the left dorsal cord at the level of T8-9 (series 46, image 28), favored to be related to demyelinating disease, although a degree of compressive myelopathy related to adjacent T8-9 disc osteophyte could be contributory. No other abnormal cord signal abnormality. Mild prominence of the central canal noted within the lower thoracic spinal cord. No abnormal enhancement. Paraspinal soft tissues: Paraspinous soft tissues within normal limits. Partially visualized lungs are clear. 3.4 cm exophytic right renal cyst noted. Visualized visceral structures otherwise unremarkable. Disc levels: T1-2:  Unremarkable. T2-3: Mild diffuse disc bulge.  No stenosis. T3-4:  Minimal disc bulge.  No stenosis. T4-5:  Unremarkable. T5-6: Left paracentral disc protrusion mildly indents the left ventral thecal sac. No stenosis or cord impingement. T6-7: Right paracentral disc protrusion mildly indents the right ventral thecal sac. Mild flattening of the right hemi cord without cord signal changes. No significant stenosis. T7-8:  Mild disc bulge without significant stenosis. T8-9: Moderate sized central disc osteophyte indents the ventral thecal sac, impinging upon the ventral spinal cord (series 46, image 26). Moderate spinal stenosis. No significant foraminal encroachment. T9-10: Central/right paracentral disc osteophyte indents the  ventral thecal sac. Secondary mild flattening of the ventral spinal cord with mild spinal stenosis. Foramina remain patent. T10-11: Central/right paracentral disc osteophyte indents the ventral thecal sac. Mild spinal stenosis without significant cord deformity. Mild facet hypertrophy. No significant foraminal encroachment. T11-12: Mild diffuse disc bulge. Mild facet hypertrophy. No significant stenosis. T12-L1:  Mild diffuse disc bulge.  No significant stenosis. IMPRESSION: MRI HEAD IMPRESSION: 1. Multifocal T2/FLAIR hyperintensities involving the supratentorial and infratentorial cerebral white matter as above in a distribution highly suspicious for demyelinating disease/multiple sclerosis. No evidence for active demyelination at this time. 2. Otherwise normal brain MRI. No other acute intracranial abnormality. MRI CERVICAL SPINE IMPRESSION: 1. Single subtle focus of cord signal abnormality within the right hemi cord at the level of C4-5, suspicious for demyelinating disease. No abnormal enhancement to suggest active demyelination. 2. Multilevel cervical spondylolysis with resultant mild spinal stenosis at C5-6 and C6-7. 3. Multifactorial degenerative changes with resultant multilevel foraminal narrowing as above. Notable findings include moderate right C4 and bilateral C6 and C7 foraminal stenosis. MRI THORACIC SPINE IMPRESSION: 1. Focal cord signal abnormality within the left dorsal cord at the level of T8-9, favored to be related to demyelinating disease, although a degree of compressive myelopathy could be considered due to adjacent prominent disc osteophyte at T8-9. No abnormal enhancement to suggest demyelinating disease. No other cord signal abnormality. 2. Multilevel disc osteophytes at T8-9 through T10-11 with resultant mild to moderate diffuse spinal stenosis, most pronounced at T8-9. 3. Additional non-compressive disc bulging elsewhere within the thoracic spine as detailed above. No other significant  spinal stenosis. Electronically Signed   By: Jeannine Boga M.D.   On: 05/28/2018 23:53    Scheduled Meds: . hydrochlorothiazide  12.5 mg Oral Daily  . irbesartan  75 mg Oral Daily  .  meloxicam  15 mg Oral Daily  . nicotine  14 mg Transdermal Once  . pantoprazole  40 mg Oral Daily  . potassium chloride  40 mEq Oral Once  . rosuvastatin  10 mg Oral q1800   Continuous Infusions: . methylPREDNISolone (SOLU-MEDROL) injection 1,000 mg (05/29/18 1311)    Principal Problem:   Multiple sclerosis ([place]) Active Problems:   Weakness of both lower extremities   Tobacco abuse   Hyperlipidemia   Essential hypertension   DJD (degenerative joint disease)    Time spent: 11 minutes    [place] NP [facility]  If 7PM-7AM, please contact night-coverage at [email or web address], password [facility] 05/29/2018, 2:00 PM  LOS: 0 days

## 2018-05-29 NOTE — ED Notes (Signed)
Patient transported to x-ray. ?

## 2018-05-29 NOTE — ED Notes (Signed)
ED Provider at bedside. 

## 2018-05-29 NOTE — Progress Notes (Signed)
Pt and family reported that they were informed that the treatment team will decide and make treatment plan this morning. Will report off to the on-coming shift nurse to follow up on that.

## 2018-05-29 NOTE — ED Notes (Signed)
Pt aware of need for urine sample.  

## 2018-05-30 LAB — BASIC METABOLIC PANEL
Anion gap: 10 (ref 5–15)
BUN: 23 mg/dL (ref 8–23)
CO2: 22 mmol/L (ref 22–32)
Calcium: 9.2 mg/dL (ref 8.9–10.3)
Chloride: 105 mmol/L (ref 98–111)
Creatinine, Ser: 0.72 mg/dL (ref 0.44–1.00)
GFR calc Af Amer: >60 mL/min (ref >60)
GFR calc non Af Amer: >60 mL/min (ref >60)
GLUCOSE: 148 mg/dL — AB (ref 70–99)
Potassium: 4 mmol/L (ref 3.5–5.1)
Sodium: 137 mmol/L (ref 135–145)

## 2018-05-30 LAB — GLUCOSE, CAPILLARY
Glucose-Capillary: 154 mg/dL — ABNORMAL HIGH (ref 70–99)
Glucose-Capillary: 164 mg/dL — ABNORMAL HIGH (ref 70–99)
Glucose-Capillary: 195 mg/dL — ABNORMAL HIGH (ref 70–99)

## 2018-05-30 LAB — VITAMIN D 25 HYDROXY (VIT D DEFICIENCY, FRACTURES): Vit D, 25-Hydroxy: 10.9 ng/mL — ABNORMAL LOW (ref 30.0–100.0)

## 2018-05-30 MED ORDER — VITAMIN D 25 MCG (1000 UNIT) PO TABS
1000.0000 [IU] | ORAL_TABLET | Freq: Every day | ORAL | Status: DC
  Administered 2018-05-30 – 2018-05-31 (×2): 1000 [IU] via ORAL
  Filled 2018-05-30 (×2): qty 1

## 2018-05-30 MED ORDER — ACETAMINOPHEN 325 MG PO TABS
650.0000 mg | ORAL_TABLET | Freq: Four times a day (QID) | ORAL | Status: DC | PRN
  Administered 2018-05-30 (×2): 650 mg via ORAL
  Filled 2018-05-30 (×2): qty 2

## 2018-05-30 MED ORDER — INSULIN ASPART 100 UNIT/ML ~~LOC~~ SOLN
0.0000 [IU] | Freq: Three times a day (TID) | SUBCUTANEOUS | Status: DC
  Administered 2018-05-30: 3 [IU] via SUBCUTANEOUS

## 2018-05-30 NOTE — Progress Notes (Signed)
PROGRESS NOTE    Joan [name]  VEH:[id number] DOB: 02/18/1956 DOA: 05/28/2018 PCP: Celene Squibb, MD   Brief Narrative:  63 year old with past medical history of ischemic colitis, hyperlipidemia, essential hypertension, insomnia, pulmonary embolism, tobacco use came to the ER with complaints of bilateral lower extremity legs feeling heavy and slightly numb.  Her MRI of the brain showed concerns of multiple sclerosis therefore started on Solu-Medrol.   Assessment & Plan:   Principal Problem:   Multiple sclerosis ([place]) Active Problems:   Tobacco abuse   Weakness of both lower extremities   Hyperlipidemia   Essential hypertension   DJD (degenerative joint disease)   Hypokalemia   Demyelinating disease ([facility])  Bilateral lower extremity weakness slightly improved Acute multiple sclerosis flare -Continue Solu-Medrol 1 g IV.  Day 2/3.  I will add sliding scale insulin -PT/OT-pending -Continue PPI - Vitamin D levels-pending -TSH within normal limits -Follow-up outpatient with Dr. Felecia Shelling from neurology.  Hyperlipidemia -Continue Crestor 10 mg daily  Essential hypertension -Continue Avapro 75 mg daily  Active tobacco use -Nicotine patch as needed   DVT prophylaxis: SCDs Code Status: Full code Family Communication: None at bedside Disposition Plan: Maintain inpatient stay for at least next 24 hours for IV steroids.  Consultants:   Neurology  Procedures:   None  Antimicrobials:   None   Subjective: Patient feels slightly better in terms of her lower extremity weakness.  Still has some ambulatory difficulty.  Review of Systems Otherwise negative except as per HPI, including: General: Denies fever, chills, night sweats or unintended weight loss. Resp: Denies cough, wheezing, shortness of breath. Cardiac: Denies chest pain, palpitations, orthopnea, paroxysmal nocturnal dyspnea. GI: Denies abdominal pain, nausea, vomiting, diarrhea or constipation GU: Denies  dysuria, frequency, hesitancy or incontinence MS: Denies muscle aches, joint pain or swelling Neuro: Denies headache, neurologic deficits (focal weakness, numbness, tingling), abnormal gait Psych: Denies anxiety, depression, SI/HI/AVH Skin: Denies new rashes or lesions ID: Denies sick contacts, exotic exposures, travel  Objective: Vitals:   05/30/18 0019 05/30/18 0620 05/30/18 0621 05/30/18 0741  BP: 113/68 (!) 103/53  99/64  Pulse: 97 (!) 59  86  Resp: 18 18  19   Temp: 97.6 F (36.4 C) 97.8 F (36.6 C)  97.8 F (36.6 C)  TempSrc: Oral Oral  Oral  SpO2: 96% 94%  98%  Weight:   87.5 kg   Height:        Intake/Output Summary (Last 24 hours) at 05/30/2018 1110 Last data filed at 05/30/2018 0906 Gross per 24 hour  Intake 1610 ml  Output 325 ml  Net 1285 ml   Filed Weights   05/29/18 0231 05/30/18 0621  Weight: 87.4 kg 87.5 kg    Examination:  General exam: Appears calm and comfortable  Respiratory system: Clear to auscultation. Respiratory effort normal. Cardiovascular system: S1 & S2 heard, RRR. No JVD, murmurs, rubs, gallops or clicks. No pedal edema. Gastrointestinal system: Abdomen is nondistended, soft and nontender. No organomegaly or masses felt. Normal bowel sounds heard. Central nervous system: Alert and oriented. No focal neurological deficits. Extremities: Symmetric 4 x 5 power. Skin: No rashes, lesions or ulcers Psychiatry: Judgement and insight appear normal. Mood & affect appropriate.     Data Reviewed:   CBC: Recent Labs  Lab 05/28/18 1326 05/29/18 0655  WBC 11.0* 10.1  NEUTROABS 6.5  --   HGB 14.8 14.0  HCT 43.4 42.1  MCV 97.5 98.1  PLT 318 836   Basic Metabolic Panel: Recent Labs  Lab  05/28/18 1326 05/29/18 0655 05/30/18 0550  NA 136 136 137  K 3.5 3.4* 4.0  CL 105 104 105  CO2 21* 22 22  GLUCOSE 97 101* 148*  BUN 16 21 23   CREATININE 0.61 0.65 0.72  CALCIUM 9.1 8.9 9.2  MG  --  2.2  --    GFR: Estimated Creatinine Clearance:  82.3 mL/min (by C-G formula based on SCr of 0.72 mg/dL). Liver Function Tests: No results for input(s): AST, ALT, ALKPHOS, BILITOT, PROT, ALBUMIN in the last 168 hours. No results for input(s): LIPASE, AMYLASE in the last 168 hours. No results for input(s): AMMONIA in the last 168 hours. Coagulation Profile: No results for input(s): INR, PROTIME in the last 168 hours. Cardiac Enzymes: No results for input(s): CKTOTAL, CKMB, CKMBINDEX, TROPONINI in the last 168 hours. BNP (last 3 results) No results for input(s): PROBNP in the last 8760 hours. HbA1C: No results for input(s): HGBA1C in the last 72 hours. CBG: No results for input(s): GLUCAP in the last 168 hours. Lipid Profile: No results for input(s): CHOL, HDL, LDLCALC, TRIG, CHOLHDL, LDLDIRECT in the last 72 hours. Thyroid Function Tests: Recent Labs    05/29/18 0655  TSH 2.227   Anemia Panel: No results for input(s): VITAMINB12, FOLATE, FERRITIN, TIBC, IRON, RETICCTPCT in the last 72 hours. Sepsis Labs: No results for input(s): PROCALCITON, LATICACIDVEN in the last 168 hours.  No results found for this or any previous visit (from the past 240 hour(s)).       Radiology Studies: Dg Chest 2 View  Result Date: 05/29/2018 CLINICAL DATA:  Numbness of bilateral legs EXAM: CHEST - 2 VIEW COMPARISON:  May 31, 2014 FINDINGS: The heart size and mediastinal contours are within normal limits. Both lungs are clear. The visualized skeletal structures are stable. IMPRESSION: No active cardiopulmonary disease. Electronically Signed   By: Abelardo Diesel M.D.   On: 05/29/2018 00:51   Mr Jeri Cos ZP Contrast  Result Date: 05/28/2018 CLINICAL DATA:  Initial evaluation for acute bilateral leg weakness. EXAM: MRI HEAD WITHOUT AND WITH CONTRAST MRI CERVICAL SPINE WITHOUT AND WITH CONTRAST MRI THORACIC SPINE WITHOUT AND WITH CONTRAST TECHNIQUE: Multiplanar, multiecho pulse sequences of the brain and surrounding structures, and cervical spine, to  include the craniocervical junction and cervicothoracic junction, as well as the thoracic spine were obtained without and with intravenous contrast. CONTRAST:  9 cc of Gadavist. COMPARISON:  Prior thoracic MRI from 09/12/2008. FINDINGS: MRI HEAD FINDINGS Brain: Cerebral volume within normal limits for age. Multifocal patchy T2/FLAIR hyperintensities seen involving the periventricular and deep white matter of both cerebral hemispheres. Several of these foci are oriented perpendicular to the lateral ventricles. Involvement of the temporal lobe white matter on the right. Few scattered black hole seen on T1 weighted sequence. Few small infratentorial foci in noted, most notable at the left pons. Overall, findings are highly suspicious for demyelinating disease/multiple sclerosis. No associated diffusion abnormality or abnormal enhancement to suggest active demyelination at this time. Punctate focus of nodular enhancement within the left cerebellum on axial postcontrast image 16 favored to be vascular in nature. No evidence for acute or subacute infarct. Gray-white matter differentiation otherwise maintained. No encephalomalacia to suggest chronic infarction. No foci of susceptibility artifact to suggest acute or chronic intracranial hemorrhage. No mass lesion, midline shift or mass effect. No hydrocephalus. No extra-axial fluid collection. Pituitary gland suprasellar region normal. Midline structures intact and normal. Vascular: Major intracranial vascular flow voids are well maintained and normal in appearance. Skull and upper  cervical spine: Craniocervical junction normal. Bone marrow signal intensity within normal limits. No scalp soft tissue abnormality. Sinuses/Orbits: Globes and orbital soft tissues within normal limits. Scattered mucosal thickening throughout the ethmoidal air cells and maxillary sinuses. No air-fluid level to suggest acute sinusitis. Mild fluid opacity noted with inferior right middle ear  cavity. No mastoid effusion. Inner ear structures normal. Other: None. MRI CERVICAL SPINE FINDINGS Alignment: Straightening with slight reversal of the normal cervical lordosis. Trace anterolisthesis of C3 on C4, with trace retrolisthesis of C5 on C6, likely chronic and degenerative. Vertebrae: Vertebral body heights maintained without evidence for acute or chronic fracture. Bone marrow signal intensity within normal limits. Few small hemangiomas noted within the C3 and C7 vertebral bodies. No worrisome osseous lesions. Minimal reactive endplate changes present about the C5-6 and C6-7 interspaces. Mild edema and enhancement about the right C3-4 facet due to facet arthritis. Cord: Small focus of cord signal abnormality within the right hemi cord at the level of C5 (series 23, image 24). Findings suspicious for possible demyelinating disease. No abnormal cord expansion or enhancement to suggest active demyelination. No other definite cord signal abnormality. Posterior Fossa, vertebral arteries, paraspinal tissues: Paraspinous and prevertebral soft tissues within normal limits. Normal intravascular enhancement seen within the vertebral arteries bilaterally. Disc levels: C2-C3: Left-sided uncovertebral spurring. No significant spinal stenosis. Foramina remain patent. C3-C4: Trace anterolisthesis. Advanced right-sided facet hypertrophy. No significant spinal stenosis. Moderate right with mild left C4 foraminal narrowing. C4-C5: Mild disc bulge with uncovertebral hypertrophy. Mild bilateral facet hypertrophy. Flattening of the ventral CSF without significant spinal stenosis. Mild bilateral C5 foraminal narrowing. C5-C6: Chronic diffuse degenerative disc osteophyte with intervertebral disc space narrowing. Disc osteophyte slightly eccentric to the left. Broad posterior component indents and mildly effaces the ventral thecal sac. Resultant mild spinal stenosis without cord deformity. Moderate bilateral C6 foraminal  narrowing, left greater than right. C6-C7: Diffuse degenerative disc osteophyte with intervertebral disc space narrowing. Flattening and partial effacement of the ventral CSF with resultant mild spinal stenosis. No cord deformity. Moderate bilateral C7 foraminal stenosis, left slightly worse than right. C7-T1: Mild right-sided facet hypertrophy. No significant stenosis. MRI CERVICAL SPINE FINDINGS Alignment: Vertebral bodies normally aligned with preservation of the normal thoracic kyphosis. No listhesis. Vertebrae: Mild chronic L1 compression fracture, stable from previous. Mild chronic height loss at the T12 vertebral body also unchanged. Scattered chronic endplate Schmorl's nodes noted throughout the mid and lower thoracic spine. Vertebral body heights otherwise maintained without evidence for acute or chronic fracture. Underlying bone marrow signal intensity within normal limits. Few small benign hemangiomas noted. No worrisome osseous lesions. No abnormal marrow edema or enhancement. Cord: Focus of cord signal abnormality seen within the left dorsal cord at the level of T8-9 (series 46, image 28), favored to be related to demyelinating disease, although a degree of compressive myelopathy related to adjacent T8-9 disc osteophyte could be contributory. No other abnormal cord signal abnormality. Mild prominence of the central canal noted within the lower thoracic spinal cord. No abnormal enhancement. Paraspinal soft tissues: Paraspinous soft tissues within normal limits. Partially visualized lungs are clear. 3.4 cm exophytic right renal cyst noted. Visualized visceral structures otherwise unremarkable. Disc levels: T1-2:  Unremarkable. T2-3: Mild diffuse disc bulge.  No stenosis. T3-4:  Minimal disc bulge.  No stenosis. T4-5:  Unremarkable. T5-6: Left paracentral disc protrusion mildly indents the left ventral thecal sac. No stenosis or cord impingement. T6-7: Right paracentral disc protrusion mildly indents the  right ventral thecal sac. Mild flattening of  the right hemi cord without cord signal changes. No significant stenosis. T7-8:  Mild disc bulge without significant stenosis. T8-9: Moderate sized central disc osteophyte indents the ventral thecal sac, impinging upon the ventral spinal cord (series 46, image 26). Moderate spinal stenosis. No significant foraminal encroachment. T9-10: Central/right paracentral disc osteophyte indents the ventral thecal sac. Secondary mild flattening of the ventral spinal cord with mild spinal stenosis. Foramina remain patent. T10-11: Central/right paracentral disc osteophyte indents the ventral thecal sac. Mild spinal stenosis without significant cord deformity. Mild facet hypertrophy. No significant foraminal encroachment. T11-12: Mild diffuse disc bulge. Mild facet hypertrophy. No significant stenosis. T12-L1:  Mild diffuse disc bulge.  No significant stenosis. IMPRESSION: MRI HEAD IMPRESSION: 1. Multifocal T2/FLAIR hyperintensities involving the supratentorial and infratentorial cerebral white matter as above in a distribution highly suspicious for demyelinating disease/multiple sclerosis. No evidence for active demyelination at this time. 2. Otherwise normal brain MRI. No other acute intracranial abnormality. MRI CERVICAL SPINE IMPRESSION: 1. Single subtle focus of cord signal abnormality within the right hemi cord at the level of C4-5, suspicious for demyelinating disease. No abnormal enhancement to suggest active demyelination. 2. Multilevel cervical spondylolysis with resultant mild spinal stenosis at C5-6 and C6-7. 3. Multifactorial degenerative changes with resultant multilevel foraminal narrowing as above. Notable findings include moderate right C4 and bilateral C6 and C7 foraminal stenosis. MRI THORACIC SPINE IMPRESSION: 1. Focal cord signal abnormality within the left dorsal cord at the level of T8-9, favored to be related to demyelinating disease, although a degree of  compressive myelopathy could be considered due to adjacent prominent disc osteophyte at T8-9. No abnormal enhancement to suggest demyelinating disease. No other cord signal abnormality. 2. Multilevel disc osteophytes at T8-9 through T10-11 with resultant mild to moderate diffuse spinal stenosis, most pronounced at T8-9. 3. Additional non-compressive disc bulging elsewhere within the thoracic spine as detailed above. No other significant spinal stenosis. Electronically Signed   By: Jeannine Boga M.D.   On: 05/28/2018 23:53   Mr Lumbar Spine Wo Contrast  Result Date: 05/28/2018 CLINICAL DATA:  Numbness the right leg beginning several days ago. Numbness of the left leg also noted, but has resolved. EXAM: MRI LUMBAR SPINE WITHOUT CONTRAST TECHNIQUE: Multiplanar, multisequence MR imaging of the lumbar spine was performed. No intravenous contrast was administered. COMPARISON:  Radiography 05/28/2018.  MRI 09/12/2008. FINDINGS: Segmentation:  5 lumbar type vertebral bodies. Alignment:  Normal Vertebrae: Old minor anterior compression fracture at L1. Old minor superior endplate fracture at L3. These were acute in 2010. Discogenic edema at the posterior endplates at Z6-1 and extensively affecting the vertebral bodies at L5-S1. Conus medullaris and cauda equina: Conus extends to the L1 level. Conus and cauda equina appear normal. Paraspinal and other soft tissues: Right upper pole renal cyst. Disc levels: T11-12 and T12-L1: Disc bulges. No likely neural compression. Those levels were not studied in the axial plane. L1-2: Mild disc bulge.  No compressive stenosis. L2-3: Moderate disc bulge. Mild facet and ligamentous hypertrophy. Mild stenosis of the lateral recesses, left more than right, but without definite neural compression. L3-4: Mild disc bulge.  No stenosis. L4-5: Mild disc bulge. Mild facet hypertrophy. No compressive stenosis. L5-S1: Endplate osteophytes and central to slightly left-sided disc herniation.  Mild facet hypertrophy. Stenosis of the subarticular lateral recesses left more than right. Left S1 nerve compression is possible. IMPRESSION: L2-3: Moderate disc bulge. Mild facet and ligamentous hypertrophy. Mild stenosis of the lateral recesses left more than right. Definite neural compression is not established  at this level however. Discogenic endplate edema which could contribute to back pain. L5-S1: Disc degeneration with endplate osteophytes and central to left-sided disc herniation. Mild facet hypertrophy. Narrowing of the lateral recesses left more than right. Left S1 nerve compression could occur in this location. Discogenic vertebral body edema which could contribute to back pain. I do not identify right-sided predominant disease to explain the right-sided predominant symptoms. Compared to the study of 2010, findings at L5-S1 appear quite similar. The findings at L2-3 have worsened. Electronically Signed   By: Nelson Chimes M.D.   On: 05/28/2018 13:10   Mr Cervical Spine W Wo Contrast  Result Date: 05/28/2018 CLINICAL DATA:  Initial evaluation for acute bilateral leg weakness. EXAM: MRI HEAD WITHOUT AND WITH CONTRAST MRI CERVICAL SPINE WITHOUT AND WITH CONTRAST MRI THORACIC SPINE WITHOUT AND WITH CONTRAST TECHNIQUE: Multiplanar, multiecho pulse sequences of the brain and surrounding structures, and cervical spine, to include the craniocervical junction and cervicothoracic junction, as well as the thoracic spine were obtained without and with intravenous contrast. CONTRAST:  9 cc of Gadavist. COMPARISON:  Prior thoracic MRI from 09/12/2008. FINDINGS: MRI HEAD FINDINGS Brain: Cerebral volume within normal limits for age. Multifocal patchy T2/FLAIR hyperintensities seen involving the periventricular and deep white matter of both cerebral hemispheres. Several of these foci are oriented perpendicular to the lateral ventricles. Involvement of the temporal lobe white matter on the right. Few scattered black  hole seen on T1 weighted sequence. Few small infratentorial foci in noted, most notable at the left pons. Overall, findings are highly suspicious for demyelinating disease/multiple sclerosis. No associated diffusion abnormality or abnormal enhancement to suggest active demyelination at this time. Punctate focus of nodular enhancement within the left cerebellum on axial postcontrast image 16 favored to be vascular in nature. No evidence for acute or subacute infarct. Gray-white matter differentiation otherwise maintained. No encephalomalacia to suggest chronic infarction. No foci of susceptibility artifact to suggest acute or chronic intracranial hemorrhage. No mass lesion, midline shift or mass effect. No hydrocephalus. No extra-axial fluid collection. Pituitary gland suprasellar region normal. Midline structures intact and normal. Vascular: Major intracranial vascular flow voids are well maintained and normal in appearance. Skull and upper cervical spine: Craniocervical junction normal. Bone marrow signal intensity within normal limits. No scalp soft tissue abnormality. Sinuses/Orbits: Globes and orbital soft tissues within normal limits. Scattered mucosal thickening throughout the ethmoidal air cells and maxillary sinuses. No air-fluid level to suggest acute sinusitis. Mild fluid opacity noted with inferior right middle ear cavity. No mastoid effusion. Inner ear structures normal. Other: None. MRI CERVICAL SPINE FINDINGS Alignment: Straightening with slight reversal of the normal cervical lordosis. Trace anterolisthesis of C3 on C4, with trace retrolisthesis of C5 on C6, likely chronic and degenerative. Vertebrae: Vertebral body heights maintained without evidence for acute or chronic fracture. Bone marrow signal intensity within normal limits. Few small hemangiomas noted within the C3 and C7 vertebral bodies. No worrisome osseous lesions. Minimal reactive endplate changes present about the C5-6 and C6-7  interspaces. Mild edema and enhancement about the right C3-4 facet due to facet arthritis. Cord: Small focus of cord signal abnormality within the right hemi cord at the level of C5 (series 23, image 24). Findings suspicious for possible demyelinating disease. No abnormal cord expansion or enhancement to suggest active demyelination. No other definite cord signal abnormality. Posterior Fossa, vertebral arteries, paraspinal tissues: Paraspinous and prevertebral soft tissues within normal limits. Normal intravascular enhancement seen within the vertebral arteries bilaterally. Disc levels: C2-C3: Left-sided uncovertebral  spurring. No significant spinal stenosis. Foramina remain patent. C3-C4: Trace anterolisthesis. Advanced right-sided facet hypertrophy. No significant spinal stenosis. Moderate right with mild left C4 foraminal narrowing. C4-C5: Mild disc bulge with uncovertebral hypertrophy. Mild bilateral facet hypertrophy. Flattening of the ventral CSF without significant spinal stenosis. Mild bilateral C5 foraminal narrowing. C5-C6: Chronic diffuse degenerative disc osteophyte with intervertebral disc space narrowing. Disc osteophyte slightly eccentric to the left. Broad posterior component indents and mildly effaces the ventral thecal sac. Resultant mild spinal stenosis without cord deformity. Moderate bilateral C6 foraminal narrowing, left greater than right. C6-C7: Diffuse degenerative disc osteophyte with intervertebral disc space narrowing. Flattening and partial effacement of the ventral CSF with resultant mild spinal stenosis. No cord deformity. Moderate bilateral C7 foraminal stenosis, left slightly worse than right. C7-T1: Mild right-sided facet hypertrophy. No significant stenosis. MRI CERVICAL SPINE FINDINGS Alignment: Vertebral bodies normally aligned with preservation of the normal thoracic kyphosis. No listhesis. Vertebrae: Mild chronic L1 compression fracture, stable from previous. Mild chronic  height loss at the T12 vertebral body also unchanged. Scattered chronic endplate Schmorl's nodes noted throughout the mid and lower thoracic spine. Vertebral body heights otherwise maintained without evidence for acute or chronic fracture. Underlying bone marrow signal intensity within normal limits. Few small benign hemangiomas noted. No worrisome osseous lesions. No abnormal marrow edema or enhancement. Cord: Focus of cord signal abnormality seen within the left dorsal cord at the level of T8-9 (series 46, image 28), favored to be related to demyelinating disease, although a degree of compressive myelopathy related to adjacent T8-9 disc osteophyte could be contributory. No other abnormal cord signal abnormality. Mild prominence of the central canal noted within the lower thoracic spinal cord. No abnormal enhancement. Paraspinal soft tissues: Paraspinous soft tissues within normal limits. Partially visualized lungs are clear. 3.4 cm exophytic right renal cyst noted. Visualized visceral structures otherwise unremarkable. Disc levels: T1-2:  Unremarkable. T2-3: Mild diffuse disc bulge.  No stenosis. T3-4:  Minimal disc bulge.  No stenosis. T4-5:  Unremarkable. T5-6: Left paracentral disc protrusion mildly indents the left ventral thecal sac. No stenosis or cord impingement. T6-7: Right paracentral disc protrusion mildly indents the right ventral thecal sac. Mild flattening of the right hemi cord without cord signal changes. No significant stenosis. T7-8:  Mild disc bulge without significant stenosis. T8-9: Moderate sized central disc osteophyte indents the ventral thecal sac, impinging upon the ventral spinal cord (series 46, image 26). Moderate spinal stenosis. No significant foraminal encroachment. T9-10: Central/right paracentral disc osteophyte indents the ventral thecal sac. Secondary mild flattening of the ventral spinal cord with mild spinal stenosis. Foramina remain patent. T10-11: Central/right paracentral  disc osteophyte indents the ventral thecal sac. Mild spinal stenosis without significant cord deformity. Mild facet hypertrophy. No significant foraminal encroachment. T11-12: Mild diffuse disc bulge. Mild facet hypertrophy. No significant stenosis. T12-L1:  Mild diffuse disc bulge.  No significant stenosis. IMPRESSION: MRI HEAD IMPRESSION: 1. Multifocal T2/FLAIR hyperintensities involving the supratentorial and infratentorial cerebral white matter as above in a distribution highly suspicious for demyelinating disease/multiple sclerosis. No evidence for active demyelination at this time. 2. Otherwise normal brain MRI. No other acute intracranial abnormality. MRI CERVICAL SPINE IMPRESSION: 1. Single subtle focus of cord signal abnormality within the right hemi cord at the level of C4-5, suspicious for demyelinating disease. No abnormal enhancement to suggest active demyelination. 2. Multilevel cervical spondylolysis with resultant mild spinal stenosis at C5-6 and C6-7. 3. Multifactorial degenerative changes with resultant multilevel foraminal narrowing as above. Notable findings include moderate right  C4 and bilateral C6 and C7 foraminal stenosis. MRI THORACIC SPINE IMPRESSION: 1. Focal cord signal abnormality within the left dorsal cord at the level of T8-9, favored to be related to demyelinating disease, although a degree of compressive myelopathy could be considered due to adjacent prominent disc osteophyte at T8-9. No abnormal enhancement to suggest demyelinating disease. No other cord signal abnormality. 2. Multilevel disc osteophytes at T8-9 through T10-11 with resultant mild to moderate diffuse spinal stenosis, most pronounced at T8-9. 3. Additional non-compressive disc bulging elsewhere within the thoracic spine as detailed above. No other significant spinal stenosis. Electronically Signed   By: Jeannine Boga M.D.   On: 05/28/2018 23:53   Mr Thoracic Spine W Wo Contrast  Result Date:  05/28/2018 CLINICAL DATA:  Initial evaluation for acute bilateral leg weakness. EXAM: MRI HEAD WITHOUT AND WITH CONTRAST MRI CERVICAL SPINE WITHOUT AND WITH CONTRAST MRI THORACIC SPINE WITHOUT AND WITH CONTRAST TECHNIQUE: Multiplanar, multiecho pulse sequences of the brain and surrounding structures, and cervical spine, to include the craniocervical junction and cervicothoracic junction, as well as the thoracic spine were obtained without and with intravenous contrast. CONTRAST:  9 cc of Gadavist. COMPARISON:  Prior thoracic MRI from 09/12/2008. FINDINGS: MRI HEAD FINDINGS Brain: Cerebral volume within normal limits for age. Multifocal patchy T2/FLAIR hyperintensities seen involving the periventricular and deep white matter of both cerebral hemispheres. Several of these foci are oriented perpendicular to the lateral ventricles. Involvement of the temporal lobe white matter on the right. Few scattered black hole seen on T1 weighted sequence. Few small infratentorial foci in noted, most notable at the left pons. Overall, findings are highly suspicious for demyelinating disease/multiple sclerosis. No associated diffusion abnormality or abnormal enhancement to suggest active demyelination at this time. Punctate focus of nodular enhancement within the left cerebellum on axial postcontrast image 16 favored to be vascular in nature. No evidence for acute or subacute infarct. Gray-white matter differentiation otherwise maintained. No encephalomalacia to suggest chronic infarction. No foci of susceptibility artifact to suggest acute or chronic intracranial hemorrhage. No mass lesion, midline shift or mass effect. No hydrocephalus. No extra-axial fluid collection. Pituitary gland suprasellar region normal. Midline structures intact and normal. Vascular: Major intracranial vascular flow voids are well maintained and normal in appearance. Skull and upper cervical spine: Craniocervical junction normal. Bone marrow signal  intensity within normal limits. No scalp soft tissue abnormality. Sinuses/Orbits: Globes and orbital soft tissues within normal limits. Scattered mucosal thickening throughout the ethmoidal air cells and maxillary sinuses. No air-fluid level to suggest acute sinusitis. Mild fluid opacity noted with inferior right middle ear cavity. No mastoid effusion. Inner ear structures normal. Other: None. MRI CERVICAL SPINE FINDINGS Alignment: Straightening with slight reversal of the normal cervical lordosis. Trace anterolisthesis of C3 on C4, with trace retrolisthesis of C5 on C6, likely chronic and degenerative. Vertebrae: Vertebral body heights maintained without evidence for acute or chronic fracture. Bone marrow signal intensity within normal limits. Few small hemangiomas noted within the C3 and C7 vertebral bodies. No worrisome osseous lesions. Minimal reactive endplate changes present about the C5-6 and C6-7 interspaces. Mild edema and enhancement about the right C3-4 facet due to facet arthritis. Cord: Small focus of cord signal abnormality within the right hemi cord at the level of C5 (series 23, image 24). Findings suspicious for possible demyelinating disease. No abnormal cord expansion or enhancement to suggest active demyelination. No other definite cord signal abnormality. Posterior Fossa, vertebral arteries, paraspinal tissues: Paraspinous and prevertebral soft tissues within normal limits.  Normal intravascular enhancement seen within the vertebral arteries bilaterally. Disc levels: C2-C3: Left-sided uncovertebral spurring. No significant spinal stenosis. Foramina remain patent. C3-C4: Trace anterolisthesis. Advanced right-sided facet hypertrophy. No significant spinal stenosis. Moderate right with mild left C4 foraminal narrowing. C4-C5: Mild disc bulge with uncovertebral hypertrophy. Mild bilateral facet hypertrophy. Flattening of the ventral CSF without significant spinal stenosis. Mild bilateral C5 foraminal  narrowing. C5-C6: Chronic diffuse degenerative disc osteophyte with intervertebral disc space narrowing. Disc osteophyte slightly eccentric to the left. Broad posterior component indents and mildly effaces the ventral thecal sac. Resultant mild spinal stenosis without cord deformity. Moderate bilateral C6 foraminal narrowing, left greater than right. C6-C7: Diffuse degenerative disc osteophyte with intervertebral disc space narrowing. Flattening and partial effacement of the ventral CSF with resultant mild spinal stenosis. No cord deformity. Moderate bilateral C7 foraminal stenosis, left slightly worse than right. C7-T1: Mild right-sided facet hypertrophy. No significant stenosis. MRI CERVICAL SPINE FINDINGS Alignment: Vertebral bodies normally aligned with preservation of the normal thoracic kyphosis. No listhesis. Vertebrae: Mild chronic L1 compression fracture, stable from previous. Mild chronic height loss at the T12 vertebral body also unchanged. Scattered chronic endplate Schmorl's nodes noted throughout the mid and lower thoracic spine. Vertebral body heights otherwise maintained without evidence for acute or chronic fracture. Underlying bone marrow signal intensity within normal limits. Few small benign hemangiomas noted. No worrisome osseous lesions. No abnormal marrow edema or enhancement. Cord: Focus of cord signal abnormality seen within the left dorsal cord at the level of T8-9 (series 46, image 28), favored to be related to demyelinating disease, although a degree of compressive myelopathy related to adjacent T8-9 disc osteophyte could be contributory. No other abnormal cord signal abnormality. Mild prominence of the central canal noted within the lower thoracic spinal cord. No abnormal enhancement. Paraspinal soft tissues: Paraspinous soft tissues within normal limits. Partially visualized lungs are clear. 3.4 cm exophytic right renal cyst noted. Visualized visceral structures otherwise unremarkable.  Disc levels: T1-2:  Unremarkable. T2-3: Mild diffuse disc bulge.  No stenosis. T3-4:  Minimal disc bulge.  No stenosis. T4-5:  Unremarkable. T5-6: Left paracentral disc protrusion mildly indents the left ventral thecal sac. No stenosis or cord impingement. T6-7: Right paracentral disc protrusion mildly indents the right ventral thecal sac. Mild flattening of the right hemi cord without cord signal changes. No significant stenosis. T7-8:  Mild disc bulge without significant stenosis. T8-9: Moderate sized central disc osteophyte indents the ventral thecal sac, impinging upon the ventral spinal cord (series 46, image 26). Moderate spinal stenosis. No significant foraminal encroachment. T9-10: Central/right paracentral disc osteophyte indents the ventral thecal sac. Secondary mild flattening of the ventral spinal cord with mild spinal stenosis. Foramina remain patent. T10-11: Central/right paracentral disc osteophyte indents the ventral thecal sac. Mild spinal stenosis without significant cord deformity. Mild facet hypertrophy. No significant foraminal encroachment. T11-12: Mild diffuse disc bulge. Mild facet hypertrophy. No significant stenosis. T12-L1:  Mild diffuse disc bulge.  No significant stenosis. IMPRESSION: MRI HEAD IMPRESSION: 1. Multifocal T2/FLAIR hyperintensities involving the supratentorial and infratentorial cerebral white matter as above in a distribution highly suspicious for demyelinating disease/multiple sclerosis. No evidence for active demyelination at this time. 2. Otherwise normal brain MRI. No other acute intracranial abnormality. MRI CERVICAL SPINE IMPRESSION: 1. Single subtle focus of cord signal abnormality within the right hemi cord at the level of C4-5, suspicious for demyelinating disease. No abnormal enhancement to suggest active demyelination. 2. Multilevel cervical spondylolysis with resultant mild spinal stenosis at C5-6 and C6-7. 3. Multifactorial  degenerative changes with resultant  multilevel foraminal narrowing as above. Notable findings include moderate right C4 and bilateral C6 and C7 foraminal stenosis. MRI THORACIC SPINE IMPRESSION: 1. Focal cord signal abnormality within the left dorsal cord at the level of T8-9, favored to be related to demyelinating disease, although a degree of compressive myelopathy could be considered due to adjacent prominent disc osteophyte at T8-9. No abnormal enhancement to suggest demyelinating disease. No other cord signal abnormality. 2. Multilevel disc osteophytes at T8-9 through T10-11 with resultant mild to moderate diffuse spinal stenosis, most pronounced at T8-9. 3. Additional non-compressive disc bulging elsewhere within the thoracic spine as detailed above. No other significant spinal stenosis. Electronically Signed   By: Jeannine Boga M.D.   On: 05/28/2018 23:53        Scheduled Meds: . irbesartan  75 mg Oral Daily  . meloxicam  15 mg Oral Daily  . nicotine  14 mg Transdermal Daily  . pantoprazole  40 mg Oral Daily  . rosuvastatin  10 mg Oral q1800   Continuous Infusions: . methylPREDNISolone (SOLU-MEDROL) injection 1,000 mg (05/30/18 1106)     LOS: 1 day   Time spent= 35 mins    Satia Winger Arsenio Loader, MD [facility]  If 7PM-7AM, please contact night-coverage [email or web address] 05/30/2018, 11:10 AM

## 2018-05-30 NOTE — Evaluation (Signed)
Physical Therapy Evaluation Patient Details Name: Joan [name] MRN: [id number] DOB: 1955/07/29 Today's Date: 05/30/2018   History of Present Illness  63 year old with past medical history of ischemic colitis, hyperlipidemia, essential hypertension, insomnia, pulmonary embolism, tobacco use came to the ER with complaints of bilateral lower extremity legs feeling heavy and slightly numb.  Her MRI of the brain showed concerns of multiple sclerosis therefore started on Solu-Medrol.  Clinical Impression  Pt admitted with above diagnosis. Pt currently with functional limitations due to the deficits listed below (see PT Problem List). Pt was able to ambulate with min guard assist with RW and min assist without device.  Pt aware to use RW at home for safety.  Gave pt Multiple Sclerosis handout with information about the disease.  Also gave pt a standing balance exercise program Access code [email or web address].  Will follow acutely.  Pt will benefit from skilled PT to increase their independence and safety with mobility to allow discharge to the venue listed below.      Follow Up Recommendations Supervision - Intermittent;Outpatient PT(Recommended Outpt PT but pt declines.)    Equipment Recommendations  None recommended by PT    Recommendations for Other Services       Precautions / Restrictions Precautions Precautions: Fall Restrictions Weight Bearing Restrictions: No      Mobility  Bed Mobility Overal bed mobility: Independent                Transfers Overall transfer level: Independent                  Ambulation/Gait Ambulation/Gait assistance: Min guard;Min assist Gait Distance (Feet): 400 Feet Assistive device: Rolling walker (2 wheeled);None Gait Pattern/deviations: Decreased stride length;Step-through pattern   Gait velocity interpretation: >2.62 ft/sec, indicative of community ambulatory General Gait Details: Pt ambulated with guarded gait without device.  Pt does  better with RW especially if at night or with challenges to balance.  Pt aware to use RW initiially.    Stairs Stairs: Yes Stairs assistance: Min guard Stair Management: One rail Right;Alternating pattern;Forwards Number of Stairs: 4 General stair comments: No issues on steps  Wheelchair Mobility    Modified Rankin (Stroke Patients Only)       Balance Overall balance assessment: Needs assistance         Standing balance support: No upper extremity supported;During functional activity;Bilateral upper extremity supported Standing balance-Leahy Scale: Fair Standing balance comment: can stand statically without device but more steady with dynamic activity with RW Single Leg Stance - Right Leg: 5 Single Leg Stance - Left Leg: 10 Tandem Stance - Right Leg: 5 Tandem Stance - Left Leg: 8     High level balance activites: Backward walking;Direction changes;Turns;Sudden stops;Head turns High Level Balance Comments: Min guard assist with challenges to balance.              Pertinent Vitals/Pain Pain Assessment: No/denies pain    Home Living Family/patient expects to be discharged to:: Private residence Living Arrangements: Spouse/significant other Available Help at Discharge: Family;Available 24 hours/day Type of Home: House Home Access: Stairs to enter Entrance Stairs-Rails: Right Entrance Stairs-Number of Steps: 2 Home Layout: One level Home Equipment: Walker - 2 wheels;Bedside commode;Tub bench Additional Comments: Pt works in Water engineer at [employer] Level of Independence: Independent               [employer]        Extremity/Trunk Assessment   Upper Extremity Assessment Upper  Extremity Assessment: Defer to OT evaluation    Lower Extremity Assessment Lower Extremity Assessment: RLE deficits/detail;LLE deficits/detail RLE Deficits / Details: grossly 4-/5 RLE Sensation: decreased light touch;decreased  proprioception LLE Deficits / Details: grossly 4/5       Communication   Communication: No difficulties  Cognition Arousal/Alertness: Awake/alert Behavior During Therapy: WFL for tasks assessed/performed Overall Cognitive Status: Within Functional Limits for tasks assessed                                        General Comments      Exercises General Exercises - Lower Extremity Hip Flexion/Marching: AROM;Both;10 reps;Standing Toe Raises: AROM;Both;10 reps;Standing Mini-Sqauts: AROM;Both;10 reps;Standing   Assessment/Plan    PT Assessment Patient needs continued PT services  PT Problem List Decreased activity tolerance;Decreased balance;Decreased strength;Decreased mobility;Decreased coordination;Decreased knowledge of use of DME;Decreased safety awareness;Decreased knowledge of precautions       PT Treatment Interventions DME instruction;Gait training;Stair training;Functional mobility training;Therapeutic activities;Therapeutic exercise;Balance training;Patient/family education    PT Goals (Current goals can be found in the Care Plan section)  Acute Rehab PT Goals Patient Stated Goal: to go home PT Goal Formulation: With patient Time For Goal Achievement: 06/13/18 Potential to Achieve Goals: Good    Frequency Min 4X/week   Barriers to discharge        Co-evaluation               AM-PAC PT "6 Clicks" Mobility  Outcome Measure Help needed turning from your back to your side while in a flat bed without using bedrails?: None Help needed moving from lying on your back to sitting on the side of a flat bed without using bedrails?: None Help needed moving to and from a bed to a chair (including a wheelchair)?: None Help needed standing up from a chair using your arms (e.g., wheelchair or bedside chair)?: A Little Help needed to walk in hospital room?: A Little Help needed climbing 3-5 steps with a railing? : A Little 6 Click Score: 21    End of  Session Equipment Utilized During Treatment: Gait belt Activity Tolerance: Patient limited by fatigue Patient left: in bed;with call bell/phone within reach(sitting EOB) Nurse Communication: Mobility status PT Visit Diagnosis: Unsteadiness on feet (R26.81);Muscle weakness (generalized) (M62.81)    Time: 0768-0881 PT Time Calculation (min) (ACUTE ONLY): 36 min   Charges:   PT Evaluation $PT Eval Moderate Complexity: 1 Mod PT Treatments $Gait Training: 8-22 mins        [place] Pager:  [phone]  Office:  [place] 05/30/2018, 4:25 PM

## 2018-05-31 LAB — GLUCOSE, CAPILLARY: GLUCOSE-CAPILLARY: 129 mg/dL — AB (ref 70–99)

## 2018-05-31 MED ORDER — VITAMIN D 25 MCG (1000 UNIT) PO TABS: 1000.0000 [IU] | ORAL_TABLET | Freq: Every day | ORAL | 0 refills | Status: AC

## 2018-05-31 MED ORDER — VITAMIN D 25 MCG (1000 UNIT) PO TABS: 1000.0000 [IU] | ORAL_TABLET | Freq: Every day | ORAL | 0 refills | Status: DC

## 2018-05-31 NOTE — Progress Notes (Signed)
Pt asking about note to go back to work , paged Dr Reesa Chew and he is writing it now

## 2018-05-31 NOTE — Discharge Summary (Signed)
Physician Discharge Summary  MAGUIRE KILLMER JJH:[id number] DOB: [birth year] DOA: 05/28/2018  PCP: Celene Squibb, MD  Admit date: 05/28/2018 Discharge date: 05/31/2018  Admitted From: Home Disposition: Home  Recommendations for Outpatient Follow-up:  1. Follow up with PCP in 1-2 weeks 2. Please obtain BMP/CBC in one week your next doctors visit.  3. Follow-up outpatient neurology, Dr. Felecia Shelling in next 3-4 weeks per neurology recommendations 4. Have advised her to rest for at least 1 week prior to returning to work 5. Started on vitamin D supplements   Discharge Condition: Stable CODE STATUS: Full code Diet recommendation: 2 g salt  Brief/Interim Summary: 63 year old with past medical history of ischemic colitis, hyperlipidemia, essential hypertension, insomnia, pulmonary embolism, tobacco use came to the ER with complaints of bilateral lower extremity legs feeling heavy and slightly numb.  Her MRI of the brain showed concerns of multiple sclerosis therefore started on Solu-Medrol.Patient received 3 doses of Solu-Medrol IV during the hospitalization and during this time her symptoms significantly improved.  TSH was within normal limits.  Vitamin D levels were noted to be low therefore she was started on supplements. Today she is reached max of benefit from hospital stay and stable for discharge with follow-up recommendations as stated above.   Discharge Diagnoses:  Principal Problem:   Multiple sclerosis ([place]) Active Problems:   Tobacco abuse   Weakness of both lower extremities   Hyperlipidemia   Essential hypertension   DJD (degenerative joint disease)   Hypokalemia   Demyelinating disease ([facility])  Bilateral lower extremity weakness pretty much resolved Acute multiple sclerosis flare -Improved after 3 days of IV Solu-Medrol 1 g.  Needs to follow-up outpatient with Dr. Felecia Shelling from neurology in 3-4 weeks so she can be placed on maintenance medication.  Appreciate neurology input -MRI  was concerning for multiple sclerosis flare. -PT/OT- no further follow-up.  Recommends outpatient if necessary but patient has declined. -Continue PPI - Vitamin D levels- low therefore started on supplements -TSH within normal limits  Vitamin D deficiency -Supplement started  Hyperlipidemia -Continue Crestor 10 mg daily  Essential hypertension -Continue Avapro 75 mg daily  Active tobacco use -Nicotine patch as needed   DVT prophylaxis: SCDs Code Status: Full code Family Communication: None at bedside Disposition Plan:  Discharge today  Consultations:  Neurology  Subjective: Feels much better, ambulating in the hallway without any issues.  Almost feels back to her baseline.  Discharge Exam: Vitals:   05/30/18 1930 05/31/18 0455  BP: (!) 133/58 104/61  Pulse: 92 75  Resp: 18 18  Temp: 98 F (36.7 C) 97.7 F (36.5 C)  SpO2: 98% 95%   Vitals:   05/30/18 0741 05/30/18 1116 05/30/18 1930 05/31/18 0455  BP: 99/64 (!) 111/53 (!) 133/58 104/61  Pulse: 86 83 92 75  Resp: 19 18 18 18   Temp: 97.8 F (36.6 C) 97.7 F (36.5 C) 98 F (36.7 C) 97.7 F (36.5 C)  TempSrc: Oral Oral Oral Oral  SpO2: 98% 98% 98% 95%  Weight:    87.8 kg  Height:        General: Pt is alert, awake, not in acute distress Cardiovascular: RRR, S1/S2 +, no rubs, no gallops Respiratory: CTA bilaterally, no wheezing, no rhonchi Abdominal: Soft, NT, ND, bowel sounds + Extremities: no edema, no cyanosis  Discharge Instructions   Allergies as of 05/31/2018   No Known Allergies     Medication List    TAKE these medications   cholecalciferol 25 MCG (1000 UT) tablet Commonly  known as:  VITAMIN D3 Take 1 tablet (1,000 Units total) by mouth daily for 30 days.   meloxicam 15 MG tablet Commonly known as:  MOBIC Take 15 mg by mouth daily as needed for pain.   omeprazole 20 MG capsule Commonly known as:  PRILOSEC Take 1 capsule (20 mg total) by mouth daily. What changed:    when to  take this  reasons to take this   rosuvastatin 10 MG tablet Commonly known as:  CRESTOR Take 10 mg by mouth daily at 6 PM.   valsartan-hydrochlorothiazide 80-12.5 MG tablet Commonly known as:  DIOVAN-HCT Take 1 tablet by mouth daily.      Follow-up Information    Celene Squibb, MD. Go on 06/07/2018.   Specialty:  Internal Medicine Why:  Follow up with PCP in 2-5 days @10 :00am Contact information: [place] [address] [phone]        [facility] EMERGENCY DEPARTMENT.   Specialty:  Emergency Medicine Why:  return to the ER for new or worsening symptoms Contact information: [address] [id number] [place]       Britt Bottom, MD. Go on 06/15/2018.   Specialty:  Neurology Why:  @1 :30pm please arrive @1 :00pm Contact information: [address] [place] [address] [phone]          No Known Allergies  You were cared for by a hospitalist during your hospital stay. If you have any questions about your discharge medications or the care you received while you were in the hospital after you are discharged, you can call the unit and asked to speak with the hospitalist on call if the hospitalist that took care of you is not available. Once you are discharged, your primary care physician will handle any further medical issues. Please note that no refills for any discharge medications will be authorized once you are discharged, as it is imperative that you return to your primary care physician (or establish a relationship with a primary care physician if you do not have one) for your aftercare needs so that they can reassess your need for medications and monitor your lab values.   Procedures/Studies: Dg Chest 2 View  Result Date: 05/29/2018 CLINICAL DATA:  Numbness of bilateral legs EXAM: CHEST - 2 VIEW COMPARISON:  May 31, 2014 FINDINGS: The heart size and mediastinal  contours are within normal limits. Both lungs are clear. The visualized skeletal structures are stable. IMPRESSION: No active cardiopulmonary disease. Electronically Signed   By: Abelardo Diesel M.D.   On: 05/29/2018 00:51   Dg Lumbar Spine Complete  Result Date: 05/28/2018 CLINICAL DATA:  Bilateral lower extremity weakness beginning yesterday. EXAM: LUMBAR SPINE - COMPLETE 4+ VIEW COMPARISON:  02/12/2009 FINDINGS: There are 5 non rib-bearing lumbar type vertebral bodies. Normal alignment of lumbar spine. No anterolisthesis or retrolisthesis. No definite pars defects. Mild (approximately 25%) compression deformities involving the superior endplates of L1 and L3 are unchanged compared to the 02/2009 examination. Remaining lumbar vertebral body heights appear preserved Moderate multilevel lumbar spine DDD, worse at L1-L2, L2-L3 and L5-S1 with disc space height loss, endplate irregularity and small posteriorly directed disc osteophyte complexes at these locations, progressed compared to the 02/2009 examination. Limited visualization of the bilateral SI joints is normal. Atherosclerotic plaque within the abdominal aorta. Regional bowel gas pattern and soft tissues are normal. IMPRESSION: 1. Moderate multilevel lumbar spine DDD, progressed compared to the 02/2009 examination. 2. Mild (approximately 5%)  compression deformities involving the superior endplates of L1 and L2, similar to the 02/2009 examination. 3.  Aortic Atherosclerosis (ICD10-I70.0). Electronically Signed   By: Sandi Mariscal M.D.   On: 05/28/2018 10:02   Mr Jeri Cos LO Contrast  Result Date: 05/28/2018 CLINICAL DATA:  Initial evaluation for acute bilateral leg weakness. EXAM: MRI HEAD WITHOUT AND WITH CONTRAST MRI CERVICAL SPINE WITHOUT AND WITH CONTRAST MRI THORACIC SPINE WITHOUT AND WITH CONTRAST TECHNIQUE: Multiplanar, multiecho pulse sequences of the brain and surrounding structures, and cervical spine, to include the craniocervical junction and  cervicothoracic junction, as well as the thoracic spine were obtained without and with intravenous contrast. CONTRAST:  9 cc of Gadavist. COMPARISON:  Prior thoracic MRI from 09/12/2008. FINDINGS: MRI HEAD FINDINGS Brain: Cerebral volume within normal limits for age. Multifocal patchy T2/FLAIR hyperintensities seen involving the periventricular and deep white matter of both cerebral hemispheres. Several of these foci are oriented perpendicular to the lateral ventricles. Involvement of the temporal lobe white matter on the right. Few scattered black hole seen on T1 weighted sequence. Few small infratentorial foci in noted, most notable at the left pons. Overall, findings are highly suspicious for demyelinating disease/multiple sclerosis. No associated diffusion abnormality or abnormal enhancement to suggest active demyelination at this time. Punctate focus of nodular enhancement within the left cerebellum on axial postcontrast image 16 favored to be vascular in nature. No evidence for acute or subacute infarct. Gray-white matter differentiation otherwise maintained. No encephalomalacia to suggest chronic infarction. No foci of susceptibility artifact to suggest acute or chronic intracranial hemorrhage. No mass lesion, midline shift or mass effect. No hydrocephalus. No extra-axial fluid collection. Pituitary gland suprasellar region normal. Midline structures intact and normal. Vascular: Major intracranial vascular flow voids are well maintained and normal in appearance. Skull and upper cervical spine: Craniocervical junction normal. Bone marrow signal intensity within normal limits. No scalp soft tissue abnormality. Sinuses/Orbits: Globes and orbital soft tissues within normal limits. Scattered mucosal thickening throughout the ethmoidal air cells and maxillary sinuses. No air-fluid level to suggest acute sinusitis. Mild fluid opacity noted with inferior right middle ear cavity. No mastoid effusion. Inner ear  structures normal. Other: None. MRI CERVICAL SPINE FINDINGS Alignment: Straightening with slight reversal of the normal cervical lordosis. Trace anterolisthesis of C3 on C4, with trace retrolisthesis of C5 on C6, likely chronic and degenerative. Vertebrae: Vertebral body heights maintained without evidence for acute or chronic fracture. Bone marrow signal intensity within normal limits. Few small hemangiomas noted within the C3 and C7 vertebral bodies. No worrisome osseous lesions. Minimal reactive endplate changes present about the C5-6 and C6-7 interspaces. Mild edema and enhancement about the right C3-4 facet due to facet arthritis. Cord: Small focus of cord signal abnormality within the right hemi cord at the level of C5 (series 23, image 24). Findings suspicious for possible demyelinating disease. No abnormal cord expansion or enhancement to suggest active demyelination. No other definite cord signal abnormality. Posterior Fossa, vertebral arteries, paraspinal tissues: Paraspinous and prevertebral soft tissues within normal limits. Normal intravascular enhancement seen within the vertebral arteries bilaterally. Disc levels: C2-C3: Left-sided uncovertebral spurring. No significant spinal stenosis. Foramina remain patent. C3-C4: Trace anterolisthesis. Advanced right-sided facet hypertrophy. No significant spinal stenosis. Moderate right with mild left C4 foraminal narrowing. C4-C5: Mild disc bulge with uncovertebral hypertrophy. Mild bilateral facet hypertrophy. Flattening of the ventral CSF without significant spinal stenosis. Mild bilateral C5 foraminal narrowing. C5-C6: Chronic diffuse degenerative disc osteophyte with intervertebral disc space narrowing. Disc osteophyte slightly eccentric to  the left. Broad posterior component indents and mildly effaces the ventral thecal sac. Resultant mild spinal stenosis without cord deformity. Moderate bilateral C6 foraminal narrowing, left greater than right. C6-C7:  Diffuse degenerative disc osteophyte with intervertebral disc space narrowing. Flattening and partial effacement of the ventral CSF with resultant mild spinal stenosis. No cord deformity. Moderate bilateral C7 foraminal stenosis, left slightly worse than right. C7-T1: Mild right-sided facet hypertrophy. No significant stenosis. MRI CERVICAL SPINE FINDINGS Alignment: Vertebral bodies normally aligned with preservation of the normal thoracic kyphosis. No listhesis. Vertebrae: Mild chronic L1 compression fracture, stable from previous. Mild chronic height loss at the T12 vertebral body also unchanged. Scattered chronic endplate Schmorl's nodes noted throughout the mid and lower thoracic spine. Vertebral body heights otherwise maintained without evidence for acute or chronic fracture. Underlying bone marrow signal intensity within normal limits. Few small benign hemangiomas noted. No worrisome osseous lesions. No abnormal marrow edema or enhancement. Cord: Focus of cord signal abnormality seen within the left dorsal cord at the level of T8-9 (series 46, image 28), favored to be related to demyelinating disease, although a degree of compressive myelopathy related to adjacent T8-9 disc osteophyte could be contributory. No other abnormal cord signal abnormality. Mild prominence of the central canal noted within the lower thoracic spinal cord. No abnormal enhancement. Paraspinal soft tissues: Paraspinous soft tissues within normal limits. Partially visualized lungs are clear. 3.4 cm exophytic right renal cyst noted. Visualized visceral structures otherwise unremarkable. Disc levels: T1-2:  Unremarkable. T2-3: Mild diffuse disc bulge.  No stenosis. T3-4:  Minimal disc bulge.  No stenosis. T4-5:  Unremarkable. T5-6: Left paracentral disc protrusion mildly indents the left ventral thecal sac. No stenosis or cord impingement. T6-7: Right paracentral disc protrusion mildly indents the right ventral thecal sac. Mild flattening of  the right hemi cord without cord signal changes. No significant stenosis. T7-8:  Mild disc bulge without significant stenosis. T8-9: Moderate sized central disc osteophyte indents the ventral thecal sac, impinging upon the ventral spinal cord (series 46, image 26). Moderate spinal stenosis. No significant foraminal encroachment. T9-10: Central/right paracentral disc osteophyte indents the ventral thecal sac. Secondary mild flattening of the ventral spinal cord with mild spinal stenosis. Foramina remain patent. T10-11: Central/right paracentral disc osteophyte indents the ventral thecal sac. Mild spinal stenosis without significant cord deformity. Mild facet hypertrophy. No significant foraminal encroachment. T11-12: Mild diffuse disc bulge. Mild facet hypertrophy. No significant stenosis. T12-L1:  Mild diffuse disc bulge.  No significant stenosis. IMPRESSION: MRI HEAD IMPRESSION: 1. Multifocal T2/FLAIR hyperintensities involving the supratentorial and infratentorial cerebral white matter as above in a distribution highly suspicious for demyelinating disease/multiple sclerosis. No evidence for active demyelination at this time. 2. Otherwise normal brain MRI. No other acute intracranial abnormality. MRI CERVICAL SPINE IMPRESSION: 1. Single subtle focus of cord signal abnormality within the right hemi cord at the level of C4-5, suspicious for demyelinating disease. No abnormal enhancement to suggest active demyelination. 2. Multilevel cervical spondylolysis with resultant mild spinal stenosis at C5-6 and C6-7. 3. Multifactorial degenerative changes with resultant multilevel foraminal narrowing as above. Notable findings include moderate right C4 and bilateral C6 and C7 foraminal stenosis. MRI THORACIC SPINE IMPRESSION: 1. Focal cord signal abnormality within the left dorsal cord at the level of T8-9, favored to be related to demyelinating disease, although a degree of compressive myelopathy could be considered due to  adjacent prominent disc osteophyte at T8-9. No abnormal enhancement to suggest demyelinating disease. No other cord signal abnormality. 2. Multilevel disc osteophytes  at T8-9 through T10-11 with resultant mild to moderate diffuse spinal stenosis, most pronounced at T8-9. 3. Additional non-compressive disc bulging elsewhere within the thoracic spine as detailed above. No other significant spinal stenosis. Electronically Signed   By: Jeannine Boga M.D.   On: 05/28/2018 23:53   Mr Lumbar Spine Wo Contrast  Result Date: 05/28/2018 CLINICAL DATA:  Numbness the right leg beginning several days ago. Numbness of the left leg also noted, but has resolved. EXAM: MRI LUMBAR SPINE WITHOUT CONTRAST TECHNIQUE: Multiplanar, multisequence MR imaging of the lumbar spine was performed. No intravenous contrast was administered. COMPARISON:  Radiography 05/28/2018.  MRI 09/12/2008. FINDINGS: Segmentation:  5 lumbar type vertebral bodies. Alignment:  Normal Vertebrae: Old minor anterior compression fracture at L1. Old minor superior endplate fracture at L3. These were acute in 2010. Discogenic edema at the posterior endplates at Z6-1 and extensively affecting the vertebral bodies at L5-S1. Conus medullaris and cauda equina: Conus extends to the L1 level. Conus and cauda equina appear normal. Paraspinal and other soft tissues: Right upper pole renal cyst. Disc levels: T11-12 and T12-L1: Disc bulges. No likely neural compression. Those levels were not studied in the axial plane. L1-2: Mild disc bulge.  No compressive stenosis. L2-3: Moderate disc bulge. Mild facet and ligamentous hypertrophy. Mild stenosis of the lateral recesses, left more than right, but without definite neural compression. L3-4: Mild disc bulge.  No stenosis. L4-5: Mild disc bulge. Mild facet hypertrophy. No compressive stenosis. L5-S1: Endplate osteophytes and central to slightly left-sided disc herniation. Mild facet hypertrophy. Stenosis of the  subarticular lateral recesses left more than right. Left S1 nerve compression is possible. IMPRESSION: L2-3: Moderate disc bulge. Mild facet and ligamentous hypertrophy. Mild stenosis of the lateral recesses left more than right. Definite neural compression is not established at this level however. Discogenic endplate edema which could contribute to back pain. L5-S1: Disc degeneration with endplate osteophytes and central to left-sided disc herniation. Mild facet hypertrophy. Narrowing of the lateral recesses left more than right. Left S1 nerve compression could occur in this location. Discogenic vertebral body edema which could contribute to back pain. I do not identify right-sided predominant disease to explain the right-sided predominant symptoms. Compared to the study of 2010, findings at L5-S1 appear quite similar. The findings at L2-3 have worsened. Electronically Signed   By: Nelson Chimes M.D.   On: 05/28/2018 13:10   Mr Cervical Spine W Wo Contrast  Result Date: 05/28/2018 CLINICAL DATA:  Initial evaluation for acute bilateral leg weakness. EXAM: MRI HEAD WITHOUT AND WITH CONTRAST MRI CERVICAL SPINE WITHOUT AND WITH CONTRAST MRI THORACIC SPINE WITHOUT AND WITH CONTRAST TECHNIQUE: Multiplanar, multiecho pulse sequences of the brain and surrounding structures, and cervical spine, to include the craniocervical junction and cervicothoracic junction, as well as the thoracic spine were obtained without and with intravenous contrast. CONTRAST:  9 cc of Gadavist. COMPARISON:  Prior thoracic MRI from 09/12/2008. FINDINGS: MRI HEAD FINDINGS Brain: Cerebral volume within normal limits for age. Multifocal patchy T2/FLAIR hyperintensities seen involving the periventricular and deep white matter of both cerebral hemispheres. Several of these foci are oriented perpendicular to the lateral ventricles. Involvement of the temporal lobe white matter on the right. Few scattered black hole seen on T1 weighted sequence. Few  small infratentorial foci in noted, most notable at the left pons. Overall, findings are highly suspicious for demyelinating disease/multiple sclerosis. No associated diffusion abnormality or abnormal enhancement to suggest active demyelination at this time. Punctate focus of nodular enhancement within  the left cerebellum on axial postcontrast image 16 favored to be vascular in nature. No evidence for acute or subacute infarct. Gray-white matter differentiation otherwise maintained. No encephalomalacia to suggest chronic infarction. No foci of susceptibility artifact to suggest acute or chronic intracranial hemorrhage. No mass lesion, midline shift or mass effect. No hydrocephalus. No extra-axial fluid collection. Pituitary gland suprasellar region normal. Midline structures intact and normal. Vascular: Major intracranial vascular flow voids are well maintained and normal in appearance. Skull and upper cervical spine: Craniocervical junction normal. Bone marrow signal intensity within normal limits. No scalp soft tissue abnormality. Sinuses/Orbits: Globes and orbital soft tissues within normal limits. Scattered mucosal thickening throughout the ethmoidal air cells and maxillary sinuses. No air-fluid level to suggest acute sinusitis. Mild fluid opacity noted with inferior right middle ear cavity. No mastoid effusion. Inner ear structures normal. Other: None. MRI CERVICAL SPINE FINDINGS Alignment: Straightening with slight reversal of the normal cervical lordosis. Trace anterolisthesis of C3 on C4, with trace retrolisthesis of C5 on C6, likely chronic and degenerative. Vertebrae: Vertebral body heights maintained without evidence for acute or chronic fracture. Bone marrow signal intensity within normal limits. Few small hemangiomas noted within the C3 and C7 vertebral bodies. No worrisome osseous lesions. Minimal reactive endplate changes present about the C5-6 and C6-7 interspaces. Mild edema and enhancement about  the right C3-4 facet due to facet arthritis. Cord: Small focus of cord signal abnormality within the right hemi cord at the level of C5 (series 23, image 24). Findings suspicious for possible demyelinating disease. No abnormal cord expansion or enhancement to suggest active demyelination. No other definite cord signal abnormality. Posterior Fossa, vertebral arteries, paraspinal tissues: Paraspinous and prevertebral soft tissues within normal limits. Normal intravascular enhancement seen within the vertebral arteries bilaterally. Disc levels: C2-C3: Left-sided uncovertebral spurring. No significant spinal stenosis. Foramina remain patent. C3-C4: Trace anterolisthesis. Advanced right-sided facet hypertrophy. No significant spinal stenosis. Moderate right with mild left C4 foraminal narrowing. C4-C5: Mild disc bulge with uncovertebral hypertrophy. Mild bilateral facet hypertrophy. Flattening of the ventral CSF without significant spinal stenosis. Mild bilateral C5 foraminal narrowing. C5-C6: Chronic diffuse degenerative disc osteophyte with intervertebral disc space narrowing. Disc osteophyte slightly eccentric to the left. Broad posterior component indents and mildly effaces the ventral thecal sac. Resultant mild spinal stenosis without cord deformity. Moderate bilateral C6 foraminal narrowing, left greater than right. C6-C7: Diffuse degenerative disc osteophyte with intervertebral disc space narrowing. Flattening and partial effacement of the ventral CSF with resultant mild spinal stenosis. No cord deformity. Moderate bilateral C7 foraminal stenosis, left slightly worse than right. C7-T1: Mild right-sided facet hypertrophy. No significant stenosis. MRI CERVICAL SPINE FINDINGS Alignment: Vertebral bodies normally aligned with preservation of the normal thoracic kyphosis. No listhesis. Vertebrae: Mild chronic L1 compression fracture, stable from previous. Mild chronic height loss at the T12 vertebral body also  unchanged. Scattered chronic endplate Schmorl's nodes noted throughout the mid and lower thoracic spine. Vertebral body heights otherwise maintained without evidence for acute or chronic fracture. Underlying bone marrow signal intensity within normal limits. Few small benign hemangiomas noted. No worrisome osseous lesions. No abnormal marrow edema or enhancement. Cord: Focus of cord signal abnormality seen within the left dorsal cord at the level of T8-9 (series 46, image 28), favored to be related to demyelinating disease, although a degree of compressive myelopathy related to adjacent T8-9 disc osteophyte could be contributory. No other abnormal cord signal abnormality. Mild prominence of the central canal noted within the lower thoracic spinal cord. No abnormal  enhancement. Paraspinal soft tissues: Paraspinous soft tissues within normal limits. Partially visualized lungs are clear. 3.4 cm exophytic right renal cyst noted. Visualized visceral structures otherwise unremarkable. Disc levels: T1-2:  Unremarkable. T2-3: Mild diffuse disc bulge.  No stenosis. T3-4:  Minimal disc bulge.  No stenosis. T4-5:  Unremarkable. T5-6: Left paracentral disc protrusion mildly indents the left ventral thecal sac. No stenosis or cord impingement. T6-7: Right paracentral disc protrusion mildly indents the right ventral thecal sac. Mild flattening of the right hemi cord without cord signal changes. No significant stenosis. T7-8:  Mild disc bulge without significant stenosis. T8-9: Moderate sized central disc osteophyte indents the ventral thecal sac, impinging upon the ventral spinal cord (series 46, image 26). Moderate spinal stenosis. No significant foraminal encroachment. T9-10: Central/right paracentral disc osteophyte indents the ventral thecal sac. Secondary mild flattening of the ventral spinal cord with mild spinal stenosis. Foramina remain patent. T10-11: Central/right paracentral disc osteophyte indents the ventral thecal  sac. Mild spinal stenosis without significant cord deformity. Mild facet hypertrophy. No significant foraminal encroachment. T11-12: Mild diffuse disc bulge. Mild facet hypertrophy. No significant stenosis. T12-L1:  Mild diffuse disc bulge.  No significant stenosis. IMPRESSION: MRI HEAD IMPRESSION: 1. Multifocal T2/FLAIR hyperintensities involving the supratentorial and infratentorial cerebral white matter as above in a distribution highly suspicious for demyelinating disease/multiple sclerosis. No evidence for active demyelination at this time. 2. Otherwise normal brain MRI. No other acute intracranial abnormality. MRI CERVICAL SPINE IMPRESSION: 1. Single subtle focus of cord signal abnormality within the right hemi cord at the level of C4-5, suspicious for demyelinating disease. No abnormal enhancement to suggest active demyelination. 2. Multilevel cervical spondylolysis with resultant mild spinal stenosis at C5-6 and C6-7. 3. Multifactorial degenerative changes with resultant multilevel foraminal narrowing as above. Notable findings include moderate right C4 and bilateral C6 and C7 foraminal stenosis. MRI THORACIC SPINE IMPRESSION: 1. Focal cord signal abnormality within the left dorsal cord at the level of T8-9, favored to be related to demyelinating disease, although a degree of compressive myelopathy could be considered due to adjacent prominent disc osteophyte at T8-9. No abnormal enhancement to suggest demyelinating disease. No other cord signal abnormality. 2. Multilevel disc osteophytes at T8-9 through T10-11 with resultant mild to moderate diffuse spinal stenosis, most pronounced at T8-9. 3. Additional non-compressive disc bulging elsewhere within the thoracic spine as detailed above. No other significant spinal stenosis. Electronically Signed   By: Jeannine Boga M.D.   On: 05/28/2018 23:53   Mr Thoracic Spine W Wo Contrast  Result Date: 05/28/2018 CLINICAL DATA:  Initial evaluation for acute  bilateral leg weakness. EXAM: MRI HEAD WITHOUT AND WITH CONTRAST MRI CERVICAL SPINE WITHOUT AND WITH CONTRAST MRI THORACIC SPINE WITHOUT AND WITH CONTRAST TECHNIQUE: Multiplanar, multiecho pulse sequences of the brain and surrounding structures, and cervical spine, to include the craniocervical junction and cervicothoracic junction, as well as the thoracic spine were obtained without and with intravenous contrast. CONTRAST:  9 cc of Gadavist. COMPARISON:  Prior thoracic MRI from 09/12/2008. FINDINGS: MRI HEAD FINDINGS Brain: Cerebral volume within normal limits for age. Multifocal patchy T2/FLAIR hyperintensities seen involving the periventricular and deep white matter of both cerebral hemispheres. Several of these foci are oriented perpendicular to the lateral ventricles. Involvement of the temporal lobe white matter on the right. Few scattered black hole seen on T1 weighted sequence. Few small infratentorial foci in noted, most notable at the left pons. Overall, findings are highly suspicious for demyelinating disease/multiple sclerosis. No associated diffusion abnormality or abnormal  enhancement to suggest active demyelination at this time. Punctate focus of nodular enhancement within the left cerebellum on axial postcontrast image 16 favored to be vascular in nature. No evidence for acute or subacute infarct. Gray-white matter differentiation otherwise maintained. No encephalomalacia to suggest chronic infarction. No foci of susceptibility artifact to suggest acute or chronic intracranial hemorrhage. No mass lesion, midline shift or mass effect. No hydrocephalus. No extra-axial fluid collection. Pituitary gland suprasellar region normal. Midline structures intact and normal. Vascular: Major intracranial vascular flow voids are well maintained and normal in appearance. Skull and upper cervical spine: Craniocervical junction normal. Bone marrow signal intensity within normal limits. No scalp soft tissue  abnormality. Sinuses/Orbits: Globes and orbital soft tissues within normal limits. Scattered mucosal thickening throughout the ethmoidal air cells and maxillary sinuses. No air-fluid level to suggest acute sinusitis. Mild fluid opacity noted with inferior right middle ear cavity. No mastoid effusion. Inner ear structures normal. Other: None. MRI CERVICAL SPINE FINDINGS Alignment: Straightening with slight reversal of the normal cervical lordosis. Trace anterolisthesis of C3 on C4, with trace retrolisthesis of C5 on C6, likely chronic and degenerative. Vertebrae: Vertebral body heights maintained without evidence for acute or chronic fracture. Bone marrow signal intensity within normal limits. Few small hemangiomas noted within the C3 and C7 vertebral bodies. No worrisome osseous lesions. Minimal reactive endplate changes present about the C5-6 and C6-7 interspaces. Mild edema and enhancement about the right C3-4 facet due to facet arthritis. Cord: Small focus of cord signal abnormality within the right hemi cord at the level of C5 (series 23, image 24). Findings suspicious for possible demyelinating disease. No abnormal cord expansion or enhancement to suggest active demyelination. No other definite cord signal abnormality. Posterior Fossa, vertebral arteries, paraspinal tissues: Paraspinous and prevertebral soft tissues within normal limits. Normal intravascular enhancement seen within the vertebral arteries bilaterally. Disc levels: C2-C3: Left-sided uncovertebral spurring. No significant spinal stenosis. Foramina remain patent. C3-C4: Trace anterolisthesis. Advanced right-sided facet hypertrophy. No significant spinal stenosis. Moderate right with mild left C4 foraminal narrowing. C4-C5: Mild disc bulge with uncovertebral hypertrophy. Mild bilateral facet hypertrophy. Flattening of the ventral CSF without significant spinal stenosis. Mild bilateral C5 foraminal narrowing. C5-C6: Chronic diffuse degenerative disc  osteophyte with intervertebral disc space narrowing. Disc osteophyte slightly eccentric to the left. Broad posterior component indents and mildly effaces the ventral thecal sac. Resultant mild spinal stenosis without cord deformity. Moderate bilateral C6 foraminal narrowing, left greater than right. C6-C7: Diffuse degenerative disc osteophyte with intervertebral disc space narrowing. Flattening and partial effacement of the ventral CSF with resultant mild spinal stenosis. No cord deformity. Moderate bilateral C7 foraminal stenosis, left slightly worse than right. C7-T1: Mild right-sided facet hypertrophy. No significant stenosis. MRI CERVICAL SPINE FINDINGS Alignment: Vertebral bodies normally aligned with preservation of the normal thoracic kyphosis. No listhesis. Vertebrae: Mild chronic L1 compression fracture, stable from previous. Mild chronic height loss at the T12 vertebral body also unchanged. Scattered chronic endplate Schmorl's nodes noted throughout the mid and lower thoracic spine. Vertebral body heights otherwise maintained without evidence for acute or chronic fracture. Underlying bone marrow signal intensity within normal limits. Few small benign hemangiomas noted. No worrisome osseous lesions. No abnormal marrow edema or enhancement. Cord: Focus of cord signal abnormality seen within the left dorsal cord at the level of T8-9 (series 46, image 28), favored to be related to demyelinating disease, although a degree of compressive myelopathy related to adjacent T8-9 disc osteophyte could be contributory. No other abnormal cord signal abnormality. Mild  prominence of the central canal noted within the lower thoracic spinal cord. No abnormal enhancement. Paraspinal soft tissues: Paraspinous soft tissues within normal limits. Partially visualized lungs are clear. 3.4 cm exophytic right renal cyst noted. Visualized visceral structures otherwise unremarkable. Disc levels: T1-2:  Unremarkable. T2-3: Mild diffuse  disc bulge.  No stenosis. T3-4:  Minimal disc bulge.  No stenosis. T4-5:  Unremarkable. T5-6: Left paracentral disc protrusion mildly indents the left ventral thecal sac. No stenosis or cord impingement. T6-7: Right paracentral disc protrusion mildly indents the right ventral thecal sac. Mild flattening of the right hemi cord without cord signal changes. No significant stenosis. T7-8:  Mild disc bulge without significant stenosis. T8-9: Moderate sized central disc osteophyte indents the ventral thecal sac, impinging upon the ventral spinal cord (series 46, image 26). Moderate spinal stenosis. No significant foraminal encroachment. T9-10: Central/right paracentral disc osteophyte indents the ventral thecal sac. Secondary mild flattening of the ventral spinal cord with mild spinal stenosis. Foramina remain patent. T10-11: Central/right paracentral disc osteophyte indents the ventral thecal sac. Mild spinal stenosis without significant cord deformity. Mild facet hypertrophy. No significant foraminal encroachment. T11-12: Mild diffuse disc bulge. Mild facet hypertrophy. No significant stenosis. T12-L1:  Mild diffuse disc bulge.  No significant stenosis. IMPRESSION: MRI HEAD IMPRESSION: 1. Multifocal T2/FLAIR hyperintensities involving the supratentorial and infratentorial cerebral white matter as above in a distribution highly suspicious for demyelinating disease/multiple sclerosis. No evidence for active demyelination at this time. 2. Otherwise normal brain MRI. No other acute intracranial abnormality. MRI CERVICAL SPINE IMPRESSION: 1. Single subtle focus of cord signal abnormality within the right hemi cord at the level of C4-5, suspicious for demyelinating disease. No abnormal enhancement to suggest active demyelination. 2. Multilevel cervical spondylolysis with resultant mild spinal stenosis at C5-6 and C6-7. 3. Multifactorial degenerative changes with resultant multilevel foraminal narrowing as above. Notable  findings include moderate right C4 and bilateral C6 and C7 foraminal stenosis. MRI THORACIC SPINE IMPRESSION: 1. Focal cord signal abnormality within the left dorsal cord at the level of T8-9, favored to be related to demyelinating disease, although a degree of compressive myelopathy could be considered due to adjacent prominent disc osteophyte at T8-9. No abnormal enhancement to suggest demyelinating disease. No other cord signal abnormality. 2. Multilevel disc osteophytes at T8-9 through T10-11 with resultant mild to moderate diffuse spinal stenosis, most pronounced at T8-9. 3. Additional non-compressive disc bulging elsewhere within the thoracic spine as detailed above. No other significant spinal stenosis. Electronically Signed   By: Jeannine Boga M.D.   On: 05/28/2018 23:53      The results of significant diagnostics from this hospitalization (including imaging, microbiology, ancillary and laboratory) are listed below for reference.     Microbiology: No results found for this or any previous visit (from the past 240 hour(s)).   Labs: BNP (last 3 results) No results for input(s): BNP in the last 8760 hours. Basic Metabolic Panel: Recent Labs  Lab 05/28/18 1326 05/29/18 0655 05/30/18 0550  NA 136 136 137  K 3.5 3.4* 4.0  CL 105 104 105  CO2 21* 22 22  GLUCOSE 97 101* 148*  BUN 16 21 23   CREATININE 0.61 0.65 0.72  CALCIUM 9.1 8.9 9.2  MG  --  2.2  --    Liver Function Tests: No results for input(s): AST, ALT, ALKPHOS, BILITOT, PROT, ALBUMIN in the last 168 hours. No results for input(s): LIPASE, AMYLASE in the last 168 hours. No results for input(s): AMMONIA in the last 168  hours. CBC: Recent Labs  Lab 05/28/18 1326 05/29/18 0655  WBC 11.0* 10.1  NEUTROABS 6.5  --   HGB 14.8 14.0  HCT 43.4 42.1  MCV 97.5 98.1  PLT 318 302   Cardiac Enzymes: No results for input(s): CKTOTAL, CKMB, CKMBINDEX, TROPONINI in the last 168 hours. BNP: Invalid input(s):  POCBNP CBG: Recent Labs  Lab 05/30/18 1128 05/30/18 1639 05/30/18 2129 05/31/18 0747  GLUCAP 154* 164* 195* 129*   D-Dimer No results for input(s): DDIMER in the last 72 hours. Hgb A1c No results for input(s): HGBA1C in the last 72 hours. Lipid Profile No results for input(s): CHOL, HDL, LDLCALC, TRIG, CHOLHDL, LDLDIRECT in the last 72 hours. Thyroid function studies Recent Labs    05/29/18 0655  TSH 2.227   Anemia work up No results for input(s): VITAMINB12, FOLATE, FERRITIN, TIBC, IRON, RETICCTPCT in the last 72 hours. Urinalysis    Component Value Date/Time   COLORURINE YELLOW 05/29/2018 0622   APPEARANCEUR CLEAR 05/29/2018 0622   LABSPEC 1.026 05/29/2018 0622   PHURINE 5.0 05/29/2018 0622   GLUCOSEU NEGATIVE 05/29/2018 0622   HGBUR NEGATIVE 05/29/2018 0622   BILIRUBINUR NEGATIVE 05/29/2018 0622   KETONESUR NEGATIVE 05/29/2018 0622   PROTEINUR NEGATIVE 05/29/2018 0622   UROBILINOGEN 1.0 09/19/2008 0806   NITRITE NEGATIVE 05/29/2018 0622   LEUKOCYTESUR NEGATIVE 05/29/2018 0622   Sepsis Labs Invalid input(s): PROCALCITONIN,  WBC,  LACTICIDVEN Microbiology No results found for this or any previous visit (from the past 240 hour(s)).   Time coordinating discharge:  I have spent 35 minutes face to face with the patient and on the ward discussing the patients care, assessment, plan and disposition with other care givers. >50% of the time was devoted counseling the patient about the risks and benefits of treatment/Discharge disposition and coordinating care.   SIGNED:   Damita Lack, MD  [facility] 05/31/2018, 10:43 AM   If 7PM-7AM, please contact night-coverage [email or web address]

## 2018-05-31 NOTE — Progress Notes (Signed)
Physical Therapy Treatment Patient Details Name: Joan [name] MRN: [id number] DOB: Mar 29, 1956 Today's Date: 05/31/2018    History of Present Illness 63 year old with past medical history of ischemic colitis, hyperlipidemia, essential hypertension, insomnia, pulmonary embolism, tobacco use came to the ER with complaints of bilateral lower extremity legs feeling heavy and slightly numb.  Her MRI of the brain showed concerns of multiple sclerosis therefore started on Solu-Medrol.    PT Comments    Focus of session review of HEP, and Energy conservation techniques. Answered questions about MS and suggested lifestyle changes that may aid in coping with MS flares when and if the occur. Encouraged being mindful of changes she experiences like episodes of numbness and tingling and documenting them so that she can provide physician with a full picture of onset of symptoms. Pt provided with handout on energy conservation techniques and verbalized understanding of HEP.       Follow Up Recommendations  Supervision - Intermittent;Outpatient PT(Recommended Outpt PT but pt declines.)     Equipment Recommendations  None recommended by PT    Recommendations for Other Services       Precautions / Restrictions Precautions Precautions: Fall Restrictions Weight Bearing Restrictions: No    Mobility  Bed Mobility Overal bed mobility: Independent                Transfers Overall transfer level: Independent                        Balance Overall balance assessment: Needs assistance         Standing balance support: No upper extremity supported;During functional activity;Bilateral upper extremity supported Standing balance-Leahy Scale: Fair Standing balance comment: can stand statically without device but more steady with dynamic activity with RW                            Cognition Arousal/Alertness: Awake/alert Behavior During Therapy: WFL for tasks  assessed/performed Overall Cognitive Status: Within Functional Limits for tasks assessed                                           General Comments  Pt provided with literature on energy conservation. Provided encouragement about lifestyle changes and regular exercise.            Home Living Family/patient expects to be discharged to:: Private residence Living Arrangements: Spouse/significant other Available Help at Discharge: Family;Available 24 hours/day Type of Home: House Home Access: Stairs to enter Entrance Stairs-Rails: Right Home Layout: One level Home Equipment: Walker - 2 wheels;Bedside commode;Tub bench Additional Comments: Pt works in Water engineer at [employer] Level of Independence: Independent          PT Goals (current goals can now be found in the care plan section) Acute Rehab PT Goals Patient Stated Goal: to go home PT Goal Formulation: With patient Time For Goal Achievement: 06/13/18 Potential to Achieve Goals: Good Progress towards PT goals: Progressing toward goals    Frequency    Min 4X/week      PT Plan Current plan remains appropriate    Co-evaluation              AM-PAC PT "6 Clicks" Mobility   Outcome Measure  Help needed turning from your back to your side while in  a flat bed without using bedrails?: None Help needed moving from lying on your back to sitting on the side of a flat bed without using bedrails?: None Help needed moving to and from a bed to a chair (including a wheelchair)?: None Help needed standing up from a chair using your arms (e.g., wheelchair or bedside chair)?: A Little Help needed to walk in hospital room?: A Little Help needed climbing 3-5 steps with a railing? : A Little 6 Click Score: 21    End of Session Equipment Utilized During Treatment: Gait belt Activity Tolerance: Patient limited by fatigue Patient left: in bed;with call bell/phone within  reach(sitting EOB) Nurse Communication: Mobility status PT Visit Diagnosis: Unsteadiness on feet (R26.81);Muscle weakness (generalized) (M62.81)     Time: 6122-4497 PT Time Calculation (min) (ACUTE ONLY): 30 min  Charges:  $Self Care/Home Management: Platter Migdalia Dk PT, DPT Acute Rehabilitation Services Pager [phone] Office [phone]    [place] 05/31/2018, 10:57 AM

## 2018-06-01 ENCOUNTER — Other Ambulatory Visit: Payer: Self-pay | Admitting: *Deleted

## 2018-06-01 ENCOUNTER — Telehealth: Payer: Self-pay | Admitting: *Deleted

## 2018-06-01 NOTE — Telephone Encounter (Signed)
I called pt and scheduled earlier NP appt for 06/08/18 at 1130am, check in 11am per Dr. Felecia Shelling request.  Pt requested I text her appt info. Advised we do not have option for this. I called her right back and LVM including information about her appt/office address and phone number. Advised her to bring updated insurance cards and copay and updated med list with her to appt.

## 2018-06-01 NOTE — Patient Outreach (Signed)
[facility]) Care Management  06/01/2018  DALANI METTE 05-Feb-1956 [id number]  Transition of care call   Referral received: 05/30/18 Initial outreach: 06/01/18 Insurance: [place] [employer]   Subjective: Initial successful telephone call to patient's preferred number in order to complete transition of care assessment; 2 HIPAA identifiers verified. Explained purpose of call and completed transition of care assessment.  Mrs. 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 She says the numbness and heaviness in her legs has improved significantly.  Mrs. Brocious voiced great appreciation for the call and said she feels much better about her diagnosis.     Objective:  Mrs. Youkhana was hospitalized at [facility]  from 2/16-2/18/2020 for numbness and heaviness in feet and legs. She was diagnoses with multiple sclerosis. Comorbidities include: history of ischemic colitis, hyperlipidemia, HTN, insomnia, pulmonary embolism, tobacco use with 30 pack year history, tubular colon adenoma, cluster headaches, seasonal allergies, and degenerative joint disease She was discharged to home on 05/31/18 without the need for home health services or DME.   Assessment:  Patient voices good understanding of all discharge instructions.  See transition of care flowsheet for assessment details.   Plan:  Encouraged Mrs. Wiens to visit [employer] site ( the web site address is on her discharge instructions) for support and resources and local Multiple Sclerosis support groups. Also advised her to contact her supervisor to receive instruction on how to begin medical leave process. Educated her to the free counseling services provided by the [place] counseling  Program..  Included Emmi education modules on Multiple Sclerosis- discharge instructions,  Multiple Sclerosis in Adults and Evoked Potentials, Visual test, and a brochure about Microbiologist Program with her successful outreach letter..  No ongoing care management needs identified so will close case to [place] Management care management services and route successful outreach letter with [place] Management pamphlet and 24 Hour Nurse Line Magnet to [place] Management clinical pool to be mailed to patient's home address.   Barrington Ellison RN,CCM,CDE [place] Management Coordinator Office Phone [phone] Office Fax [phone]

## 2018-06-07 ENCOUNTER — Encounter (HOSPITAL_COMMUNITY): Payer: Self-pay

## 2018-06-07 ENCOUNTER — Ambulatory Visit (HOSPITAL_COMMUNITY): Payer: 59

## 2018-06-07 DIAGNOSIS — F411 Generalized anxiety disorder: Secondary | ICD-10-CM | POA: Diagnosis not present

## 2018-06-07 DIAGNOSIS — F5101 Primary insomnia: Secondary | ICD-10-CM | POA: Diagnosis not present

## 2018-06-07 DIAGNOSIS — E049 Nontoxic goiter, unspecified: Secondary | ICD-10-CM | POA: Diagnosis not present

## 2018-06-07 DIAGNOSIS — F39 Unspecified mood [affective] disorder: Secondary | ICD-10-CM | POA: Diagnosis not present

## 2018-06-07 DIAGNOSIS — K219 Gastro-esophageal reflux disease without esophagitis: Secondary | ICD-10-CM | POA: Diagnosis not present

## 2018-06-07 DIAGNOSIS — Z720 Tobacco use: Secondary | ICD-10-CM | POA: Diagnosis not present

## 2018-06-07 DIAGNOSIS — E782 Mixed hyperlipidemia: Secondary | ICD-10-CM | POA: Diagnosis not present

## 2018-06-07 DIAGNOSIS — G35 Multiple sclerosis: Secondary | ICD-10-CM | POA: Diagnosis not present

## 2018-06-07 DIAGNOSIS — E559 Vitamin D deficiency, unspecified: Secondary | ICD-10-CM | POA: Diagnosis not present

## 2018-06-07 DIAGNOSIS — I700 Atherosclerosis of aorta: Secondary | ICD-10-CM | POA: Diagnosis not present

## 2018-06-07 DIAGNOSIS — I259 Chronic ischemic heart disease, unspecified: Secondary | ICD-10-CM | POA: Diagnosis not present

## 2018-06-07 DIAGNOSIS — I10 Essential (primary) hypertension: Secondary | ICD-10-CM | POA: Diagnosis not present

## 2018-06-08 ENCOUNTER — Ambulatory Visit: Payer: 59 | Admitting: Neurology

## 2018-06-08 ENCOUNTER — Telehealth: Payer: Self-pay | Admitting: Neurology

## 2018-06-08 ENCOUNTER — Encounter: Payer: Self-pay | Admitting: Neurology

## 2018-06-08 VITALS — BP 105/60 | HR 74 | Ht 66.0 in | Wt 192.0 lb

## 2018-06-08 DIAGNOSIS — R292 Abnormal reflex: Secondary | ICD-10-CM | POA: Diagnosis not present

## 2018-06-08 DIAGNOSIS — G379 Demyelinating disease of central nervous system, unspecified: Secondary | ICD-10-CM

## 2018-06-08 DIAGNOSIS — R29898 Other symptoms and signs involving the musculoskeletal system: Secondary | ICD-10-CM

## 2018-06-08 DIAGNOSIS — R200 Anesthesia of skin: Secondary | ICD-10-CM

## 2018-06-08 MED ORDER — VITAMIN D (ERGOCALCIFEROL) 1.25 MG (50000 UNIT) PO CAPS: 50000.0000 [IU] | ORAL_CAPSULE | ORAL | 1 refills | Status: DC

## 2018-06-08 MED FILL — VIT D2 1.25 MG (50,000 UNIT: 1.25 MG | 84 days supply | Qty: 12 | Fill #0

## 2018-06-08 NOTE — Telephone Encounter (Addendum)
I called LVM for pt to call. Dr. Felecia Shelling will place order and she will be called to get scheduled. We typically send order to [facility] imaging unless she has a specific request on where she would like to go. Please let her know if she calls.   Also let her know Vit D prescription sent into her pharmacy.

## 2018-06-08 NOTE — Telephone Encounter (Signed)
Pt is wanting to know where LP will be sent. Please call to advise
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
## 2018-06-08 NOTE — Telephone Encounter (Signed)
Spoke with Dr. Felecia Shelling. He would like Vit D 5000U weekly for her to take for 6 months. I sent rx to pharmacy for her.

## 2018-06-08 NOTE — Telephone Encounter (Signed)
Pt said at [place] she thought Dr Felecia Shelling wanted to send in script for Vit D3. Please call to advise

## 2018-06-09 ENCOUNTER — Other Ambulatory Visit: Payer: Self-pay | Admitting: *Deleted

## 2018-06-09 DIAGNOSIS — Z0289 Encounter for other administrative examinations: Secondary | ICD-10-CM

## 2018-06-09 DIAGNOSIS — G35 Multiple sclerosis: Secondary | ICD-10-CM

## 2018-06-09 NOTE — Telephone Encounter (Signed)
LP order placed

## 2018-06-15 ENCOUNTER — Ambulatory Visit: Payer: 59 | Admitting: Neurology

## 2018-06-15 MED FILL — HYDROCHLOROTHIAZIDE 12.5 MG: 12.5 | 30 days supply | Qty: 30 | Fill #0

## 2018-06-15 MED FILL — VALSARTAN 80 MG TABLET: 80 | 30 days supply | Qty: 30 | Fill #0

## 2018-06-16 ENCOUNTER — Ambulatory Visit
Admission: RE | Admit: 2018-06-16 | Discharge: 2018-06-16 | Disposition: A | Discharge status: Home / Self Care | Payer: 59 | Source: Ambulatory Visit | Attending: Neurology | Admitting: Neurology

## 2018-06-16 VITALS — BP 108/48 | HR 63

## 2018-06-16 DIAGNOSIS — G379 Demyelinating disease of central nervous system, unspecified: Secondary | ICD-10-CM | POA: Diagnosis not present

## 2018-06-16 DIAGNOSIS — R200 Anesthesia of skin: Secondary | ICD-10-CM

## 2018-06-16 DIAGNOSIS — G35 Multiple sclerosis: Secondary | ICD-10-CM | POA: Diagnosis not present

## 2018-06-16 DIAGNOSIS — R29898 Other symptoms and signs involving the musculoskeletal system: Secondary | ICD-10-CM | POA: Diagnosis not present

## 2018-06-16 NOTE — Discharge Instructions (Signed)

## 2018-06-16 NOTE — Progress Notes (Signed)
One SST tube of blood drawn from left [facility] space for LP labs by Lavenia Atlas, RN; site unremarkable.

## 2018-06-21 ENCOUNTER — Telehealth: Payer: Self-pay | Admitting: *Deleted

## 2018-06-21 LAB — CNS IGG SYNTHESIS RATE, CSF+BLOOD
Albumin Serum: 3.8 g/dL (ref 3.2–4.6)
Albumin, CSF: 30.3 mg/dL (ref 8.0–42.0)
CNS-IgG Synthesis Rate: 8.7 mg/24 h — ABNORMAL HIGH (ref <=3.3)
IGG-INDEX: 0.83 — AB (ref <0.66)
IgG (Immunoglobin G), Serum: 741 mg/dL (ref 600–1540)
IgG Total CSF: 4.9 mg/dL (ref 0.8–7.7)

## 2018-06-21 LAB — CSF CELL COUNT WITH DIFFERENTIAL
RBC Count, CSF: 1 cells/uL (ref 0–10)
WBC, CSF: 4 cells/uL (ref 0–5)

## 2018-06-21 LAB — GLUCOSE, CSF: Glucose, CSF: 71 mg/dL (ref 40–80)

## 2018-06-21 LAB — PROTEIN, CSF: Total Protein, CSF: 52 mg/dL (ref 15–60)

## 2018-06-21 LAB — OLIGOCLONAL BANDS, CSF + SERM

## 2018-06-21 LAB — VDRL, CSF: VDRL Quant, CSF: NONREACTIVE

## 2018-06-21 NOTE — Telephone Encounter (Signed)
Continuous FMLA forms completed, to cover pt's hospitalization and LP, and returned to medical records. She was hospitalized from 05/28/18-05/31/18. She missed work on Feb. 17, 18, 19, 21, 22, 23, 24, 27, 28, and March 6, 7, 8 of 2020. Testing is ongoing to determine a dx. Intermittent FMLA can be completed once a dx. is made. I kept a blank copy of pt's FMLA forms, and will file it at [facility] desk with the forms already completed./fim

## 2018-06-22 ENCOUNTER — Telehealth: Payer: Self-pay | Admitting: *Deleted

## 2018-06-22 NOTE — Telephone Encounter (Signed)
Called and spoke with pt. Scheduled appt for tomorrow at 2:30pm with Dr. Felecia Shelling to discuss MS treatment options. Advised her to check in at 2pm. Pt verbalized understanding and appreciation.

## 2018-06-23 ENCOUNTER — Telehealth: Payer: Self-pay | Admitting: *Deleted

## 2018-06-23 ENCOUNTER — Other Ambulatory Visit: Payer: Self-pay

## 2018-06-23 ENCOUNTER — Encounter: Payer: Self-pay | Admitting: Neurology

## 2018-06-23 ENCOUNTER — Ambulatory Visit: Payer: 59 | Admitting: Neurology

## 2018-06-23 VITALS — BP 119/70 | HR 72 | Ht 66.0 in | Wt 193.5 lb

## 2018-06-23 DIAGNOSIS — R200 Anesthesia of skin: Secondary | ICD-10-CM

## 2018-06-23 DIAGNOSIS — R292 Abnormal reflex: Secondary | ICD-10-CM | POA: Diagnosis not present

## 2018-06-23 DIAGNOSIS — G35 Multiple sclerosis: Secondary | ICD-10-CM | POA: Diagnosis not present

## 2018-06-23 NOTE — Telephone Encounter (Signed)
Placed [facility] lab in [facility] lock box for routine lab pick up. Results pending. 
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
## 2018-06-25 LAB — QUANTIFERON-TB GOLD PLUS
QUANTIFERON TB1 AG VALUE: 0.02 [IU]/mL
QuantiFERON Mitogen Value: >10 IU/mL
QuantiFERON Nil Value: 0.02 IU/mL
QuantiFERON TB2 Ag Value: 0.02 IU/mL
QuantiFERON-TB Gold Plus: NEGATIVE

## 2018-06-25 LAB — HEPATIC FUNCTION PANEL
ALBUMIN: 4.8 g/dL (ref 3.8–4.8)
ALT: 15 IU/L (ref 0–32)
AST: 15 IU/L (ref 0–40)
Alkaline Phosphatase: 73 IU/L (ref 39–117)
Bilirubin Total: 0.5 mg/dL (ref 0.0–1.2)
Bilirubin, Direct: 0.12 mg/dL (ref 0.00–0.40)
TOTAL PROTEIN: 7.1 g/dL (ref 6.0–8.5)

## 2018-06-28 ENCOUNTER — Telehealth: Payer: Self-pay | Admitting: *Deleted

## 2018-06-28 ENCOUNTER — Telehealth: Payer: Self-pay | Admitting: Neurology

## 2018-06-28 NOTE — Telephone Encounter (Signed)
PA Aubagio submitted on covermymeds. [id number] - PA Case ID: [id number]. Waiting on determination.

## 2018-06-28 NOTE — Telephone Encounter (Signed)
See other phone note

## 2018-06-28 NOTE — Telephone Encounter (Signed)
Pt returned RN's call. Please call back once available.

## 2018-06-28 NOTE — Telephone Encounter (Signed)
I called patient back since she called. I relayed information below. She will call me back once she starts on medication. She is aware she has to have her liver function checked monthly for 5 months after starting medication.

## 2018-06-28 NOTE — Telephone Encounter (Signed)
Spoke with Dr. Felecia Shelling- lab results look good. Ok to send in [place] start form. JCV ab still pending but that is to have for future if therapy needed to be changed to Tysabri.   I called pt and LVM for her to call. I wanted to let her know Aubagio start form faxed in. PA will need to be done. We will work on this. Once approved and she receives medication and starts, she will need to call and let us know so we can place lab orders for her to get monthly labwork to check hepatic function monthly for 5 months after starting Aubagio.

## 2018-06-29 NOTE — Telephone Encounter (Signed)
Dr. Sater- please advise 

## 2018-06-29 NOTE — Telephone Encounter (Addendum)
PA Aubagio denied, stating pt must try/fail two of the alternatives: Avonex, copaxone,glatiramer/glatopa, Gilenya, Plegridy, Rebif, Betaseron, Tecifdera, Mavenclad, Mayzent. I will discuss with Dr. Felecia Shelling to decide on next steps

## 2018-06-29 NOTE — Telephone Encounter (Signed)
Start form was faxed to patient to sign. Waiting on her to fax back to me.

## 2018-06-29 NOTE — Telephone Encounter (Signed)
I called pt back. She is agreeable to try Tecfidera instead. I went over common SE. She would like me to fax start form to her home at [phone]. She will call her husband to turn off answering service so that fax will go through. She wanted me to wait about 10 min before faxing.

## 2018-06-30 ENCOUNTER — Telehealth: Payer: Self-pay | Admitting: *Deleted

## 2018-06-30 NOTE — Telephone Encounter (Signed)
PA for Tecfidera started through covermymeds (key: [id number]).  Pt has coverage with [employer] [phone]).  Pt [id number].  This will be her initial therapy.  Decision pending.

## 2018-06-30 NOTE — Telephone Encounter (Signed)
Faxed completed/signed Tecfidera start form to [place]. Received fax confirmation.

## 2018-06-30 NOTE — Telephone Encounter (Signed)
JCV ab drawn on 06/23/18 0.40, indeterminate. Inhibition assay: positive.

## 2018-06-30 NOTE — Telephone Encounter (Signed)
Called, LVM for pt to call office to discuss paperwork (FMLA).

## 2018-06-30 NOTE — Telephone Encounter (Signed)
Received signed start form back from pt via fax. I filled out and pending MD signature. Once signed, I will fax in.

## 2018-07-01 NOTE — Telephone Encounter (Signed)
Gave completed/signed form to medical records to process for pt. 

## 2018-07-01 NOTE — Telephone Encounter (Signed)
Pt returned call. RN not available. Please call back as soon as available.

## 2018-07-01 NOTE — Telephone Encounter (Signed)
Received notification from [employer] [facility] that they received referral for Tecfidera. Pt uses this pharmacy w/ insurance. Faxed them notice of approval. Received fax confirmation.

## 2018-07-01 NOTE — Telephone Encounter (Signed)
Received fax notification from [employer] that PA approved 06/30/18-06/29/19 for max 12 fills. Ref#: [id number]. Faxed notice of approval to [facility] (fax: [phone]) and [facility] (phone: [phone], fax: [phone]). Received fax confirmation for both.

## 2018-07-01 NOTE — Telephone Encounter (Signed)
I called pt back. She went back to work 06/13/18 even though we wrote her out on continuous leave until 06/20/18. She worked 3/3-3/4, was off work 3/5. Missed work 3/6-3/8 and 3/12. Advised we will fill out new FMLA for intermittent leave and then send in. I spoke with Dr. Felecia Shelling who approved to giver her 4 days/month for intermittent leave.

## 2018-07-04 DIAGNOSIS — Z0289 Encounter for other administrative examinations: Secondary | ICD-10-CM

## 2018-07-07 ENCOUNTER — Telehealth: Payer: Self-pay | Admitting: *Deleted

## 2018-07-07 NOTE — Telephone Encounter (Signed)
Took call from angelique with [employer], Aubagio.  She is following up on aubagio for pt.  I relayed that PA denied and Dr. Felecia Shelling changed to tecfidera.  She will close out file on this.  Disregard fax (2nd) that was sent today.

## 2018-07-19 MED FILL — VALSARTAN-HCTZ 80-12.5 MG T: 80-12.5 | 30 days supply | Qty: 30 | Fill #0

## 2018-08-15 MED FILL — VALSARTAN-HCTZ 80-12.5 MG T: 80-12.5 | 30 days supply | Qty: 30 | Fill #1

## 2018-08-16 ENCOUNTER — Ambulatory Visit: Payer: 59 | Admitting: Neurology

## 2018-08-16 DIAGNOSIS — E049 Nontoxic goiter, unspecified: Secondary | ICD-10-CM | POA: Diagnosis not present

## 2018-08-16 DIAGNOSIS — K219 Gastro-esophageal reflux disease without esophagitis: Secondary | ICD-10-CM | POA: Diagnosis not present

## 2018-08-16 DIAGNOSIS — F39 Unspecified mood [affective] disorder: Secondary | ICD-10-CM | POA: Diagnosis not present

## 2018-08-16 DIAGNOSIS — E782 Mixed hyperlipidemia: Secondary | ICD-10-CM | POA: Diagnosis not present

## 2018-08-16 DIAGNOSIS — Z720 Tobacco use: Secondary | ICD-10-CM | POA: Diagnosis not present

## 2018-08-16 DIAGNOSIS — F5101 Primary insomnia: Secondary | ICD-10-CM | POA: Diagnosis not present

## 2018-08-16 DIAGNOSIS — I10 Essential (primary) hypertension: Secondary | ICD-10-CM | POA: Diagnosis not present

## 2018-08-16 DIAGNOSIS — F411 Generalized anxiety disorder: Secondary | ICD-10-CM | POA: Diagnosis not present

## 2018-08-16 DIAGNOSIS — I259 Chronic ischemic heart disease, unspecified: Secondary | ICD-10-CM | POA: Diagnosis not present

## 2018-09-12 MED FILL — ROSUVASTATIN CALCIUM 10 MG: 10 | 90 days supply | Qty: 90 | Fill #0

## 2018-09-12 MED FILL — VALSARTAN-HCTZ 80-12.5 MG T: 80-12.5 | 30 days supply | Qty: 30 | Fill #2

## 2018-09-13 ENCOUNTER — Encounter: Payer: Self-pay | Admitting: Neurology

## 2018-09-13 DIAGNOSIS — F5101 Primary insomnia: Secondary | ICD-10-CM | POA: Diagnosis not present

## 2018-09-13 DIAGNOSIS — F411 Generalized anxiety disorder: Secondary | ICD-10-CM | POA: Diagnosis not present

## 2018-09-13 DIAGNOSIS — I10 Essential (primary) hypertension: Secondary | ICD-10-CM | POA: Diagnosis not present

## 2018-09-13 DIAGNOSIS — I259 Chronic ischemic heart disease, unspecified: Secondary | ICD-10-CM | POA: Diagnosis not present

## 2018-09-13 DIAGNOSIS — K219 Gastro-esophageal reflux disease without esophagitis: Secondary | ICD-10-CM | POA: Diagnosis not present

## 2018-09-13 DIAGNOSIS — E782 Mixed hyperlipidemia: Secondary | ICD-10-CM | POA: Diagnosis not present

## 2018-10-12 MED FILL — VALSARTAN-HCTZ 80-12.5 MG T: 80-12.5 | 30 days supply | Qty: 30 | Fill #3

## 2018-10-13 DIAGNOSIS — I10 Essential (primary) hypertension: Secondary | ICD-10-CM | POA: Diagnosis not present

## 2018-10-13 DIAGNOSIS — E782 Mixed hyperlipidemia: Secondary | ICD-10-CM | POA: Diagnosis not present

## 2018-10-24 ENCOUNTER — Ambulatory Visit: Payer: 59 | Admitting: Neurology

## 2018-10-24 ENCOUNTER — Encounter: Payer: Self-pay | Admitting: Neurology

## 2018-10-24 ENCOUNTER — Other Ambulatory Visit: Payer: Self-pay

## 2018-10-24 VITALS — BP 102/65 | HR 67 | Temp 98.0°F | Ht 66.0 in | Wt 190.5 lb

## 2018-10-24 DIAGNOSIS — R292 Abnormal reflex: Secondary | ICD-10-CM

## 2018-10-24 DIAGNOSIS — G35 Multiple sclerosis: Secondary | ICD-10-CM | POA: Diagnosis not present

## 2018-10-24 DIAGNOSIS — R200 Anesthesia of skin: Secondary | ICD-10-CM | POA: Diagnosis not present
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
## 2018-11-03 DIAGNOSIS — E785 Hyperlipidemia, unspecified: Secondary | ICD-10-CM | POA: Diagnosis not present

## 2018-11-03 DIAGNOSIS — I10 Essential (primary) hypertension: Secondary | ICD-10-CM | POA: Diagnosis not present

## 2018-11-03 DIAGNOSIS — F419 Anxiety disorder, unspecified: Secondary | ICD-10-CM | POA: Diagnosis not present

## 2018-11-03 DIAGNOSIS — M06841 Other specified rheumatoid arthritis, right hand: Secondary | ICD-10-CM | POA: Diagnosis not present

## 2018-11-03 DIAGNOSIS — G35 Multiple sclerosis: Secondary | ICD-10-CM | POA: Diagnosis not present

## 2018-11-03 DIAGNOSIS — M06842 Other specified rheumatoid arthritis, left hand: Secondary | ICD-10-CM | POA: Diagnosis not present

## 2018-11-07 MED FILL — VALSARTAN-HCTZ 80-12.5 MG T: 80-12.5 | 30 days supply | Qty: 30 | Fill #4

## 2018-12-01 ENCOUNTER — Telehealth: Payer: Self-pay | Admitting: Neurology

## 2018-12-01 DIAGNOSIS — G35 Multiple sclerosis: Secondary | ICD-10-CM

## 2018-12-01 NOTE — Telephone Encounter (Signed)
We can do MRi brain and cerv spine with/without

## 2018-12-01 NOTE — Telephone Encounter (Signed)
[facility] UMR order sent to GI. No auth they will reach out to the patient to schedule.  

## 2018-12-01 NOTE — Addendum Note (Signed)
Addended by: Hope Pigeon on: 12/01/2018 02:48 PM   Modules accepted: Orders

## 2018-12-01 NOTE — Telephone Encounter (Signed)
Dr. Sater- are you ok with this? °

## 2018-12-01 NOTE — Telephone Encounter (Signed)
Called and spoke with pt. Relayed Dr. Garth Bigness message. She is agreeable to this. Placed MRI orders. She is aware she will be called to get scheduled once insurance approves.

## 2018-12-01 NOTE — Telephone Encounter (Signed)
Pt states her deductible on her insurance starts over in Oct.  Pt wants to know if Dr Felecia Shelling would allow her to have her next MRI before Oct. Please call

## 2018-12-12 MED FILL — VALSARTAN-HCTZ 80-12.5 MG T: 80-12.5 | 30 days supply | Qty: 30 | Fill #5

## 2018-12-28 ENCOUNTER — Other Ambulatory Visit: Payer: Self-pay | Admitting: *Deleted

## 2018-12-28 MED ORDER — TECFIDERA 240 MG PO CPDR: 1.0000 | DELAYED_RELEASE_CAPSULE | Freq: Two times a day (BID) | ORAL | 11 refills | Status: DC

## 2018-12-30 ENCOUNTER — Ambulatory Visit
Admission: RE | Admit: 2018-12-30 | Discharge: 2018-12-30 | Disposition: A | Discharge status: Home / Self Care | Payer: 59 | Source: Ambulatory Visit | Attending: Neurology | Admitting: Neurology

## 2018-12-30 DIAGNOSIS — G35 Multiple sclerosis: Secondary | ICD-10-CM | POA: Diagnosis not present

## 2018-12-30 MED ORDER — GADOBENATE DIMEGLUMINE 529 MG/ML IV SOLN
17.0000 mL | Freq: Once | INTRAVENOUS | Status: AC | PRN
  Administered 2018-12-30: 17 mL via INTRAVENOUS

## 2019-01-02 ENCOUNTER — Telehealth: Payer: Self-pay | Admitting: *Deleted

## 2019-01-02 NOTE — Telephone Encounter (Signed)
Called, LVM for pt about MRI results per Dr. Felecia Shelling note. Gave [facility] phone number if she has further questions.

## 2019-01-02 NOTE — Telephone Encounter (Signed)
-----   Message from Britt Bottom, MD sent at 01/02/2019  1:43 PM EDT ----- Please let the patient know that the MRI showed no new MS lesions in the brain or spine.

## 2019-01-16 MED FILL — VALSARTAN-HCTZ 80-12.5 MG T: 80-12.5 | 30 days supply | Qty: 30 | Fill #6

## 2019-02-09 DIAGNOSIS — M545 Low back pain: Secondary | ICD-10-CM | POA: Diagnosis not present

## 2019-02-09 MED FILL — METHOCARBAMOL 750 MG TABS: 750 | 10 days supply | Qty: 30 | Fill #0

## 2019-02-09 MED FILL — predniSONE 10 MG TABS: 10 | 21 days supply | Qty: 21 | Fill #0

## 2019-02-09 MED FILL — VALSARTAN-HCTZ 80-12.5 MG T: 80-12.5 | 30 days supply | Qty: 30 | Fill #7

## 2019-02-27 ENCOUNTER — Telehealth: Payer: Self-pay | Admitting: Neurology

## 2019-02-27 MED ORDER — TECFIDERA 240 MG PO CPDR: 1.0000 | DELAYED_RELEASE_CAPSULE | Freq: Two times a day (BID) | ORAL | 11 refills | Status: DC

## 2019-02-27 NOTE — Telephone Encounter (Signed)
LVM for pt to call back. Per notes Tecfidera PA was approved on 06/30/2018-06/29/2019?  PA should not be needed at this time... If pt calls back would need to confirm if insurance has changed since 06/30/2018.

## 2019-02-27 NOTE — Telephone Encounter (Signed)
Pt returned call and was informed. She states she will call her insurance company with this information due to no insurance changes tha have been made.

## 2019-02-27 NOTE — Telephone Encounter (Signed)
Pt called stating that her insurance is waiting on the PA for the pt's [place] Please advise.

## 2019-02-27 NOTE — Telephone Encounter (Signed)
I contacted the pt. She reports [employer] stated she would have a 200 $ copayment for the generic tecfidera and she could not afford this. I asked the pt if the SP had provided a # for her to call to obtain assistance. She reports she did receive a # and attempted to call but could not get through to speak with a representative. I recommended she back and the pt was agreeable.  Pt was advised she may have to enrol in the savings program for the generic brand.  Pt verbalized understanding and will call back tomorrow to update me on the status.

## 2019-02-27 NOTE — Telephone Encounter (Signed)
Joan [name] with [employer] [facility] called in regards to the Joan [name].Joan [name] states that [employer] will no longer cover brand name prescriptions and will need the generic version of this medication sent over. Please follow up.

## 2019-02-27 NOTE — Telephone Encounter (Signed)
Pt has called and was made aware of the most recent response from [employer].  Pt is asking what she needs to do at this point.  Please call, she states she only as 5 days worth remaining.  Pt also wants to know about how to get the savings card because she is unable to pay out $200.00 a month for the medication

## 2019-02-27 NOTE — Telephone Encounter (Signed)
Noted, thank you

## 2019-02-27 NOTE — Telephone Encounter (Signed)
Generic tefidera has been sent.  Pt has [employer].

## 2019-02-27 NOTE — Addendum Note (Signed)
Addended by: Verlin Grills T on: 02/27/2019 02:28 PM   Modules accepted: Orders

## 2019-03-07 ENCOUNTER — Other Ambulatory Visit (HOSPITAL_COMMUNITY): Payer: Self-pay | Admitting: Internal Medicine

## 2019-03-07 DIAGNOSIS — Z1231 Encounter for screening mammogram for malignant neoplasm of breast: Secondary | ICD-10-CM

## 2019-03-08 NOTE — Telephone Encounter (Signed)
I followed up with the pt. She was able to get her tecfidera.  Pt also asked if there was any contraindication for her taking the covid 19 vaccine? I advised as long as the vaccine was not live no contraindications for not taking it.

## 2019-03-13 ENCOUNTER — Encounter: Payer: Self-pay | Admitting: Pharmacist

## 2019-03-13 ENCOUNTER — Other Ambulatory Visit: Payer: Self-pay | Admitting: Adult Health Nurse Practitioner

## 2019-03-13 ENCOUNTER — Other Ambulatory Visit: Payer: Self-pay

## 2019-03-13 ENCOUNTER — Ambulatory Visit (HOSPITAL_BASED_OUTPATIENT_CLINIC_OR_DEPARTMENT_OTHER): Payer: 59 | Admitting: Pharmacist

## 2019-03-13 DIAGNOSIS — Z79899 Other long term (current) drug therapy: Secondary | ICD-10-CM

## 2019-03-13 MED ORDER — DIMETHYL FUMARATE 240 MG PO CPDR: 1.0000 | DELAYED_RELEASE_CAPSULE | Freq: Two times a day (BID) | ORAL | 10 refills | Status: DC

## 2019-03-13 MED FILL — VALSARTAN-HCTZ 80-12.5 MG T: 80-12.5 | 30 days supply | Qty: 30 | Fill #8

## 2019-03-13 MED FILL — ROSUVASTATIN CALCIUM 10 MG: 10 | 90 days supply | Qty: 90 | Fill #0

## 2019-03-13 NOTE — Progress Notes (Signed)
   S: Patient presents today for review of their specialty medication.   Patient is currently taking Tecfidera (dimethyl fumarate) for MS. Patient is managed by Dr. Richard Sater for this.   Adherence: confirms  Efficacy: pt is happy with her results and reports "feeling like a teenager again" compared to her time of dx.   Dosing: 240 mg BID  Renal adjustment: no adjustment necessary  Hepatic adjustment: no adjustment necessary  Dose adjustment for toxicity:  Flushing, GI intolerance, or intolerance to maintenance dose: Consider temporary dose reduction to 120 mg twice daily (resume recommended maintenance dose of 240 mg twice daily within 4 weeks). Consider discontinuation in patients who cannot tolerate return to the maintenance dose.  Hepatic injury (suspected drug-induced), clinically significant: Discontinue treatment.  Lymphocyte count <500/mm3 persisting for >6 months: Consider treatment interruption.  Serious infection: Consider withholding treatment until infection resolves.  Current adverse effects: Flushing, skin rash, or puritus: none S/sx of infection: none  GI upset: none  S/sx of heptotoxicity: none; per pt, has blood work done q3months. No LFT abnormalities. Lymphopenia: none per pt PML/proteinuria: none   O: Labs followed q3months by Dr. Sater. WNL per pt.   Lab Results  Component Value Date   WBC 10.1 05/29/2018   HGB 14.0 05/29/2018   HCT 42.1 05/29/2018   MCV 98.1 05/29/2018   PLT 302 05/29/2018      Chemistry      Component Value Date/Time   NA 137 05/30/2018 0550   K 4.0 05/30/2018 0550   CL 105 05/30/2018 0550   CO2 22 05/30/2018 0550   BUN 23 05/30/2018 0550   CREATININE 0.72 05/30/2018 0550      Component Value Date/Time   CALCIUM 9.2 05/30/2018 0550   ALKPHOS 73 06/23/2018 1501   AST 15 06/23/2018 1501   ALT 15 06/23/2018 1501   BILITOT 0.5 06/23/2018 1501     A/P: 1. Medication review: Patient is currently on Tecfidera for MS  and is tolerating it well. Reviewed the medication with the patient, including the following: dimethyl fumarate activates the Nrf2 pathway, which is believed to result in anti-inflammatory and cytoprotective properties. The medication is oral and should be swallowed whole. Administering with a high-fat, high-protein meal may decrease flushing and GI side effects. Possible adverse effects include skin flushing, pruritus, GI upset, albuminura, infection, lymphocytopenia, and increased liver transaminases. Cases of PML have been reported with severe, long-standing lymphopenia identified as the primary risk for PML. Dose adjustments for toxicities have been summarized above. No recommendations for any changes at this time.   Luke Van Ausdall, PharmD, CPP Clinical Pharmacist [facility] [phone]       

## 2019-03-29 MED FILL — DIMETHYL FUMARATE 240 MG CP: 240 | 30 days supply | Qty: 60 | Fill #0

## 2019-04-10 MED FILL — VALSARTAN-HCTZ 80-12.5 MG T: 80-12.5 | 10 days supply | Qty: 30 | Fill #0

## 2019-04-13 ENCOUNTER — Other Ambulatory Visit: Payer: Self-pay

## 2019-04-13 ENCOUNTER — Ambulatory Visit (HOSPITAL_COMMUNITY)
Admission: RE | Admit: 2019-04-13 | Discharge: 2019-04-13 | Disposition: A | Discharge status: Home / Self Care | Payer: 59 | Source: Ambulatory Visit | Attending: Internal Medicine | Admitting: Internal Medicine

## 2019-04-13 DIAGNOSIS — Z1231 Encounter for screening mammogram for malignant neoplasm of breast: Secondary | ICD-10-CM | POA: Insufficient documentation

## 2019-04-26 ENCOUNTER — Other Ambulatory Visit: Payer: Self-pay

## 2019-04-26 ENCOUNTER — Encounter: Payer: Self-pay | Admitting: Neurology

## 2019-04-26 ENCOUNTER — Ambulatory Visit (INDEPENDENT_AMBULATORY_CARE_PROVIDER_SITE_OTHER): Payer: 59 | Admitting: Neurology

## 2019-04-26 VITALS — BP 134/79 | HR 92 | Temp 97.4°F | Ht 66.0 in | Wt 195.5 lb

## 2019-04-26 DIAGNOSIS — R200 Anesthesia of skin: Secondary | ICD-10-CM

## 2019-04-26 DIAGNOSIS — G35 Multiple sclerosis: Secondary | ICD-10-CM

## 2019-04-26 DIAGNOSIS — M153 Secondary multiple arthritis: Secondary | ICD-10-CM
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
## 2019-05-01 MED FILL — VALSARTAN-HCTZ 80-12.5 MG T: 80-12.5 | 10 days supply | Qty: 30 | Fill #1

## 2019-05-01 MED FILL — DIMETHYL FUMARATE 240 MG CP: 240 | 30 days supply | Qty: 60 | Fill #1

## 2019-05-01 MED FILL — ROSUVASTATIN CALCIUM 10 MG: 10 | 90 days supply | Qty: 90 | Fill #0

## 2019-05-04 DIAGNOSIS — I10 Essential (primary) hypertension: Secondary | ICD-10-CM | POA: Diagnosis not present

## 2019-05-04 DIAGNOSIS — E049 Nontoxic goiter, unspecified: Secondary | ICD-10-CM | POA: Diagnosis not present

## 2019-05-04 DIAGNOSIS — Z1329 Encounter for screening for other suspected endocrine disorder: Secondary | ICD-10-CM | POA: Diagnosis not present

## 2019-05-04 DIAGNOSIS — E782 Mixed hyperlipidemia: Secondary | ICD-10-CM | POA: Diagnosis not present

## 2019-05-04 DIAGNOSIS — Z1321 Encounter for screening for nutritional disorder: Secondary | ICD-10-CM | POA: Diagnosis not present

## 2019-05-09 ENCOUNTER — Telehealth: Payer: Self-pay | Admitting: Neurology

## 2019-05-09 DIAGNOSIS — E559 Vitamin D deficiency, unspecified: Secondary | ICD-10-CM | POA: Diagnosis not present

## 2019-05-09 DIAGNOSIS — E782 Mixed hyperlipidemia: Secondary | ICD-10-CM | POA: Diagnosis not present

## 2019-05-09 DIAGNOSIS — I10 Essential (primary) hypertension: Secondary | ICD-10-CM | POA: Diagnosis not present

## 2019-05-09 DIAGNOSIS — M19042 Primary osteoarthritis, left hand: Secondary | ICD-10-CM | POA: Diagnosis not present

## 2019-05-09 DIAGNOSIS — Z716 Tobacco abuse counseling: Secondary | ICD-10-CM | POA: Diagnosis not present

## 2019-05-09 DIAGNOSIS — Z720 Tobacco use: Secondary | ICD-10-CM | POA: Diagnosis not present

## 2019-05-09 DIAGNOSIS — F411 Generalized anxiety disorder: Secondary | ICD-10-CM | POA: Diagnosis not present

## 2019-05-09 DIAGNOSIS — M19041 Primary osteoarthritis, right hand: Secondary | ICD-10-CM | POA: Diagnosis not present

## 2019-05-09 DIAGNOSIS — F39 Unspecified mood [affective] disorder: Secondary | ICD-10-CM | POA: Diagnosis not present

## 2019-05-09 NOTE — Telephone Encounter (Signed)
Spoke with Dr. Felecia Shelling. He recommends she alternate between taking 5,000U and10,000U each day. I called pt and relayed this message. She verbalized understanding.

## 2019-05-09 NOTE — Telephone Encounter (Signed)
Pt called stating that her blood work showed that her Vitamin D was low and the pt would like to know if she should double up on her OTC Vitamin or what is advised to her.

## 2019-05-09 NOTE — Telephone Encounter (Addendum)
Tried calling pt back. Went straight to VM. Left message for her to call back.  I wanted to know what her level was and how much Vit D she is taking right now.  Pt called back and stated Vit D level was 29.1 which was flagged as low. She currently takes 5000 units po daily OTC. Wondering if she should increase her daily dose.

## 2019-05-29 MED FILL — VALSARTAN-HCTZ 80-12.5 MG T: 80-12.5 | 30 days supply | Qty: 30 | Fill #2

## 2019-05-29 MED FILL — DIMETHYL FUMARATE 240 MG CP: 240 | 30 days supply | Qty: 60 | Fill #2

## 2019-06-14 ENCOUNTER — Telehealth: Payer: Self-pay | Admitting: *Deleted

## 2019-06-14 DIAGNOSIS — Z0289 Encounter for other administrative examinations: Secondary | ICD-10-CM

## 2019-06-14 NOTE — Telephone Encounter (Signed)
Gave compelted/signed FMLA back to medical records to process for pt.

## 2019-06-15 ENCOUNTER — Telehealth: Payer: Self-pay | Admitting: *Deleted

## 2019-06-15 NOTE — Telephone Encounter (Signed)
Pt [employer] form faxed on 03/04/210to [phone]

## 2019-06-26 MED FILL — DIMETHYL FUMARATE 240 MG CP: 240 | 30 days supply | Qty: 60 | Fill #3

## 2019-06-26 MED FILL — VALSARTAN-HCTZ 80-12.5 MG T: 80-12.5 | 30 days supply | Qty: 30 | Fill #3

## 2019-07-24 MED FILL — VALSARTAN-HCTZ 80-12.5 MG T: 80-12.5 | 30 days supply | Qty: 30 | Fill #4

## 2019-07-24 MED FILL — ROSUVASTATIN CALCIUM 10 MG: 10 | 90 days supply | Qty: 90 | Fill #1

## 2019-07-25 ENCOUNTER — Telehealth: Payer: Self-pay | Admitting: *Deleted

## 2019-07-25 MED FILL — DIMETHYL FUMARATE 240 MG CP: 240 | 30 days supply | Qty: 60 | Fill #4

## 2019-07-25 NOTE — Telephone Encounter (Signed)
Submitted PA dimethyl fumarate (generic) on CMM. Key: [id number]. Waiting on determination from [employer].

## 2019-07-25 NOTE — Telephone Encounter (Addendum)
Received fax from [employer] that PA approved for max of 12 fills from 07/25/19-07/23/20 for 2 caps/day. PA ref number: [id number].   Faxed notice of approval to [employer] at [phone]. Received fax confirmation.

## 2019-08-03 DIAGNOSIS — D225 Melanocytic nevi of trunk: Secondary | ICD-10-CM | POA: Diagnosis not present

## 2019-08-03 DIAGNOSIS — L820 Inflamed seborrheic keratosis: Secondary | ICD-10-CM | POA: Diagnosis not present

## 2019-08-03 DIAGNOSIS — L821 Other seborrheic keratosis: Secondary | ICD-10-CM | POA: Diagnosis not present

## 2019-08-18 MED FILL — DIMETHYL FUMARATE 240 MG CP: 240 | 30 days supply | Qty: 60 | Fill #5

## 2019-08-18 MED FILL — VALSARTAN-HCTZ 80-12.5 MG T: 80-12.5 | 30 days supply | Qty: 30 | Fill #5

## 2019-09-07 ENCOUNTER — Encounter: Payer: Self-pay | Admitting: Family Medicine

## 2019-09-13 MED FILL — VALSARTAN-HCTZ 80-12.5 MG T: 80-12.5 | 90 days supply | Qty: 90 | Fill #0

## 2019-09-14 MED FILL — DIMETHYL FUMARATE 240 MG CP: 240 | 30 days supply | Qty: 60 | Fill #6

## 2019-09-28 DIAGNOSIS — J019 Acute sinusitis, unspecified: Secondary | ICD-10-CM | POA: Diagnosis not present

## 2019-10-19 DIAGNOSIS — E782 Mixed hyperlipidemia: Secondary | ICD-10-CM | POA: Diagnosis not present

## 2019-10-19 DIAGNOSIS — J019 Acute sinusitis, unspecified: Secondary | ICD-10-CM | POA: Diagnosis not present

## 2019-10-19 DIAGNOSIS — F411 Generalized anxiety disorder: Secondary | ICD-10-CM | POA: Diagnosis not present

## 2019-10-19 DIAGNOSIS — Z720 Tobacco use: Secondary | ICD-10-CM | POA: Diagnosis not present

## 2019-10-19 DIAGNOSIS — I259 Chronic ischemic heart disease, unspecified: Secondary | ICD-10-CM | POA: Diagnosis not present

## 2019-10-19 DIAGNOSIS — K219 Gastro-esophageal reflux disease without esophagitis: Secondary | ICD-10-CM | POA: Diagnosis not present

## 2019-10-19 DIAGNOSIS — F5101 Primary insomnia: Secondary | ICD-10-CM | POA: Diagnosis not present

## 2019-10-19 DIAGNOSIS — I10 Essential (primary) hypertension: Secondary | ICD-10-CM | POA: Diagnosis not present

## 2019-10-19 DIAGNOSIS — E049 Nontoxic goiter, unspecified: Secondary | ICD-10-CM | POA: Diagnosis not present

## 2019-10-19 MED FILL — DIMETHYL FUMARATE 240 MG CP: 240 | 30 days supply | Qty: 60 | Fill #7

## 2019-10-24 ENCOUNTER — Other Ambulatory Visit (HOSPITAL_COMMUNITY): Payer: Self-pay | Admitting: Nurse Practitioner

## 2019-10-24 DIAGNOSIS — E782 Mixed hyperlipidemia: Secondary | ICD-10-CM | POA: Diagnosis not present

## 2019-10-24 DIAGNOSIS — E559 Vitamin D deficiency, unspecified: Secondary | ICD-10-CM | POA: Diagnosis not present

## 2019-10-24 DIAGNOSIS — F17210 Nicotine dependence, cigarettes, uncomplicated: Secondary | ICD-10-CM | POA: Diagnosis not present

## 2019-10-24 DIAGNOSIS — G35 Multiple sclerosis: Secondary | ICD-10-CM | POA: Diagnosis not present

## 2019-10-24 DIAGNOSIS — M19042 Primary osteoarthritis, left hand: Secondary | ICD-10-CM | POA: Diagnosis not present

## 2019-10-24 DIAGNOSIS — F39 Unspecified mood [affective] disorder: Secondary | ICD-10-CM | POA: Diagnosis not present

## 2019-10-24 DIAGNOSIS — I10 Essential (primary) hypertension: Secondary | ICD-10-CM | POA: Diagnosis not present

## 2019-10-24 DIAGNOSIS — F411 Generalized anxiety disorder: Secondary | ICD-10-CM | POA: Diagnosis not present

## 2019-10-24 DIAGNOSIS — M19041 Primary osteoarthritis, right hand: Secondary | ICD-10-CM | POA: Diagnosis not present

## 2019-10-25 ENCOUNTER — Ambulatory Visit: Payer: 59 | Admitting: Family Medicine

## 2019-10-27 MED FILL — ROSUVASTATIN CALCIUM 10 MG: 10 | 90 days supply | Qty: 90 | Fill #0

## 2019-10-27 MED FILL — IBUPROFEN 600 MG TABLET: 600 | 90 days supply | Qty: 270 | Fill #0

## 2019-11-04 DIAGNOSIS — G35 Multiple sclerosis: Secondary | ICD-10-CM | POA: Diagnosis not present

## 2019-11-09 ENCOUNTER — Other Ambulatory Visit (HOSPITAL_COMMUNITY): Payer: Self-pay | Admitting: Internal Medicine

## 2019-11-09 ENCOUNTER — Other Ambulatory Visit: Payer: Self-pay | Admitting: Internal Medicine

## 2019-11-09 DIAGNOSIS — H5203 Hypermetropia, bilateral: Secondary | ICD-10-CM | POA: Diagnosis not present

## 2019-11-09 DIAGNOSIS — H524 Presbyopia: Secondary | ICD-10-CM | POA: Diagnosis not present

## 2019-11-09 DIAGNOSIS — F17210 Nicotine dependence, cigarettes, uncomplicated: Secondary | ICD-10-CM

## 2019-11-14 MED FILL — DIMETHYL FUMARATE 240 MG CP: 240 | 30 days supply | Qty: 60 | Fill #8

## 2019-12-05 ENCOUNTER — Other Ambulatory Visit: Payer: Self-pay

## 2019-12-05 ENCOUNTER — Ambulatory Visit (HOSPITAL_COMMUNITY)
Admission: RE | Admit: 2019-12-05 | Discharge: 2019-12-05 | Disposition: A | Discharge status: Home / Self Care | Payer: 59 | Source: Ambulatory Visit | Attending: Internal Medicine | Admitting: Internal Medicine

## 2019-12-05 DIAGNOSIS — F17210 Nicotine dependence, cigarettes, uncomplicated: Secondary | ICD-10-CM | POA: Insufficient documentation

## 2019-12-11 MED FILL — DIMETHYL FUMARATE 240 MG CP: 240 | 30 days supply | Qty: 60 | Fill #9

## 2019-12-11 MED FILL — VALSARTAN-HCTZ 80-12.5 MG T: 80-12.5 | 90 days supply | Qty: 90 | Fill #1

## 2019-12-14 ENCOUNTER — Encounter: Payer: Self-pay | Admitting: Family Medicine

## 2019-12-14 ENCOUNTER — Ambulatory Visit: Payer: 59 | Admitting: Family Medicine

## 2019-12-14 VITALS — BP 105/64 | HR 85 | Ht 66.0 in | Wt 195.0 lb

## 2019-12-14 DIAGNOSIS — G35 Multiple sclerosis: Secondary | ICD-10-CM | POA: Diagnosis not present

## 2019-12-14 DIAGNOSIS — R200 Anesthesia of skin: Secondary | ICD-10-CM | POA: Diagnosis not present

## 2019-12-14 NOTE — Progress Notes (Signed)
I have read the note, and I agree with the clinical assessment and plan.  Jabaree Mercado A. Falen Lehrmann, MD, PhD, FAAN Certified in Neurology, Clinical Neurophysiology, Sleep Medicine, Pain Medicine and Neuroimaging  [facility] [address], [address] [place], [place] [address] [phone]  

## 2019-12-14 NOTE — Patient Instructions (Signed)
We will continue Tecfidera as prescribed. You are doing great. Please let me know if you have any new or worsening symptoms.   Stay well hydrated. Eat a well balanced diet and exercise regularly.   Follow up in 6 months    Multiple Sclerosis Multiple sclerosis (MS) is a disease of the brain, spinal cord, and optic nerves (central nervous system). It causes the body's disease-fighting (immune) system to destroy the protective covering (myelin sheath) around nerves in the brain. When this happens, signals (nerve impulses) going to and from the brain and spinal cord do not get sent properly or may not get sent at all. There are several types of MS:  Relapsing-remitting MS. This is the most common type. This causes sudden attacks of symptoms. After an attack, you may recover completely until the next attack, or some symptoms may remain permanently.  Secondary progressive MS. This usually develops after the onset of relapsing-remitting MS. Similar to relapsing-remitting MS, this type also causes sudden attacks of symptoms. Attacks may be less frequent, but symptoms slowly get worse (progress) over time.  Primary progressive MS. This causes symptoms that steadily progress over time. This type of MS does not cause sudden attacks of symptoms. The age of onset of MS varies, but it often develops between 43-11 years of age. MS is a lifelong (chronic) condition. There is no cure, but treatment can help slow down the progression of the disease. What are the causes? The cause of this condition is not known. What increases the risk? You are more likely to develop this condition if:  You are a woman.  You have a relative with MS. However, the condition is not passed from parent to child (inherited).  You have a lack (deficiency) of vitamin D.  You smoke. MS is more common in the [place] than in the [place]. What are the signs or symptoms? Relapsing-remitting and  secondary progressive MS cause symptoms to occur in episodes or attacks that may last weeks to months. There may be long periods between attacks in which there are almost no symptoms. Primary progressive MS causes symptoms to steadily progress after they develop. Symptoms of MS vary because of the many different ways it affects the central nervous system. The main symptoms include:  Vision problems and eye pain.  Numbness.  Weakness.  Inability to move your arms, hands, feet, or legs (paralysis).  Balance problems.  Shaking that you cannot control (tremors).  Muscle spasms.  Problems with thinking (cognitive changes). MS can also cause symptoms that are associated with the disease, but are not always the direct result of an MS attack. They may include:  Inability to control urination or bowel movements (incontinence).  Headaches.  Fatigue.  Inability to tolerate heat.  Emotional changes.  Depression.  Pain. How is this diagnosed? This condition is diagnosed based on:  Your symptoms.  A neurological exam. This involves checking central nervous system function, such as nerve function, reflexes, and coordination.  MRIs of the brain and spinal cord.  Lab tests, including a lumbar puncture that tests the fluid that surrounds the brain and spinal cord (cerebrospinal fluid).  Tests to measure the electrical activity of the brain in response to stimulation (evoked potentials). How is this treated? There is no cure for MS, but medicines can help decrease the number and frequency of attacks and help relieve nuisance symptoms. Treatment options may include:  Medicines that reduce the frequency of attacks. These medicines may be given  by injection, by mouth (orally), or through an IV.  Medicines that reduce inflammation (steroids). These may provide short-term relief of symptoms.  Medicines to help control pain, depression, fatigue, or incontinence.  Vitamin D, if you have a  deficiency.  Using devices to help you move around (assistive devices), such as braces, a cane, or a walker.  Physical therapy to strengthen and stretch your muscles.  Occupational therapy to help you with everyday tasks.  Alternative or complementary treatments such as exercise, massage, or acupuncture. Follow these instructions at home:  Take over-the-counter and prescription medicines only as told by your health care provider.  Do not drive or use heavy machinery while taking prescription pain medicine.  Use assistive devices as recommended by your physical therapist or your health care provider.  Exercise as directed by your health care provider.  Return to your normal activities as told by your health care provider. Ask your health care provider what activities are safe for you.  Reach out for support. Share your feelings with friends, family, or a support group.  Keep all follow-up visits as told by your health care provider and therapists. This is important. Where to find more information  National Multiple Sclerosis Society: https://www.nationalmssociety.org Contact a health care provider if:  You feel depressed.  You develop new pain or numbness.  You have tremors.  You have problems with sexual function. Get help right away if:  You develop paralysis.  You develop numbness.  You have problems with your bladder or bowel function.  You develop double vision.  You lose vision in one or both eyes.  You develop suicidal thoughts.  You develop severe confusion. If you ever feel like you may hurt yourself or others, or have thoughts about taking your own life, get help right away. You can go to your nearest emergency department or call:  Your local emergency services (911 in the [place]  A suicide crisis helpline, such as the [facility] at (770)263-2924. This is open 24 hours a day. Summary  Multiple sclerosis (MS) is a disease of  the central nervous system that causes the body's immune system to destroy the protective covering (myelin sheath) around nerves in the brain.  There are 3 types of MS: relapsing-remitting, secondary progressive, and primary progressive. Relapsing-remitting and secondary progressive MS cause symptoms to occur in episodes or attacks that may last weeks to months. Primary progressive MS causes symptoms to steadily progress after they develop.  There is no cure for MS, but medicines can help decrease the number and frequency of attacks and help relieve nuisance symptoms. Treatment may also include physical or occupational therapy.  If you develop numbness, paralysis, vision problems, or other neurological symptoms, get help right away. This information is not intended to replace advice given to you by your health care provider. Make sure you discuss any questions you have with your health care provider. Document Revised: 03/12/2017 Document Reviewed: 06/08/2016 Elsevier Patient Education  2020 [employer].
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
## 2019-12-19 ENCOUNTER — Telehealth: Payer: Self-pay

## 2019-12-19 DIAGNOSIS — G35 Multiple sclerosis: Secondary | ICD-10-CM

## 2019-12-19 NOTE — Telephone Encounter (Signed)
Patient wanted to let Amy know that she has changed her mind and would like to go ahead with the repeat MRI now rather than wait.

## 2019-12-19 NOTE — Addendum Note (Signed)
Addended by: Debbora Presto L on: 12/19/2019 04:29 PM   Modules accepted: Orders

## 2019-12-20 ENCOUNTER — Telehealth: Payer: Self-pay | Admitting: Family Medicine

## 2019-12-20 NOTE — Telephone Encounter (Signed)
cone umr order sent to GI. No auth they will reach out to the patient to schedule.  

## 2020-01-02 ENCOUNTER — Ambulatory Visit
Admission: RE | Admit: 2020-01-02 | Discharge: 2020-01-02 | Disposition: A | Discharge status: Home / Self Care | Payer: 59 | Source: Ambulatory Visit | Attending: Family Medicine | Admitting: Family Medicine

## 2020-01-02 ENCOUNTER — Other Ambulatory Visit: Payer: Self-pay

## 2020-01-02 DIAGNOSIS — G35 Multiple sclerosis: Secondary | ICD-10-CM

## 2020-01-02 MED ORDER — GADOBENATE DIMEGLUMINE 529 MG/ML IV SOLN
18.0000 mL | Freq: Once | INTRAVENOUS | Status: AC | PRN
  Administered 2020-01-02: 18 mL via INTRAVENOUS

## 2020-01-03 ENCOUNTER — Telehealth: Payer: Self-pay

## 2020-01-03 NOTE — Telephone Encounter (Signed)
Debbora Presto, NP  Elita Quick, CMA  Will you please let her know that her MRI looks stable. Dr Felecia Shelling has reviewed it an no changes form previous imaging. We will continue current treatment plan.

## 2020-01-03 NOTE — Telephone Encounter (Signed)
Attempted to call the patient without success.  LM on the VM for the patient to call back re: recent results.  **If the patient calls back please relay the message below from Amy Lomax NP 

## 2020-01-08 MED FILL — DIMETHYL FUMARATE 240 MG CP: 240 | 30 days supply | Qty: 60 | Fill #10

## 2020-01-08 NOTE — Telephone Encounter (Signed)
Pt left voicemail asking for results to MRI, the message from [facility] was read to pt, there were no questions.  This is FYI, no call back requested.

## 2020-01-30 ENCOUNTER — Other Ambulatory Visit: Payer: Self-pay | Admitting: Pharmacist

## 2020-01-30 ENCOUNTER — Other Ambulatory Visit: Payer: Self-pay | Admitting: Neurology

## 2020-01-30 MED ORDER — DIMETHYL FUMARATE 240 MG PO CPDR: DELAYED_RELEASE_CAPSULE | ORAL | 11 refills | Status: DC

## 2020-02-01 MED FILL — ROSUVASTATIN CALCIUM 10 MG: 10 | 90 days supply | Qty: 90 | Fill #0

## 2020-02-05 MED FILL — DIMETHYL FUMARATE 240 MG CP: 240 | 30 days supply | Qty: 60 | Fill #0

## 2020-02-26 DIAGNOSIS — G35 Multiple sclerosis: Secondary | ICD-10-CM | POA: Diagnosis not present

## 2020-02-28 ENCOUNTER — Other Ambulatory Visit (HOSPITAL_COMMUNITY): Payer: Self-pay | Admitting: Internal Medicine

## 2020-02-28 DIAGNOSIS — Z1231 Encounter for screening mammogram for malignant neoplasm of breast: Secondary | ICD-10-CM

## 2020-03-04 ENCOUNTER — Other Ambulatory Visit: Payer: Self-pay

## 2020-03-04 ENCOUNTER — Ambulatory Visit (HOSPITAL_BASED_OUTPATIENT_CLINIC_OR_DEPARTMENT_OTHER): Payer: 59 | Admitting: Pharmacist

## 2020-03-04 DIAGNOSIS — Z79899 Other long term (current) drug therapy: Secondary | ICD-10-CM

## 2020-03-04 NOTE — Progress Notes (Signed)
   S: Patient presents today for review of their specialty medication.   Patient is currently taking Tecfidera (dimethyl fumarate) for MS. Patient is managed by Dr. Arlice Colt for this.   Adherence: confirms  Efficacy: pt is happy with her results and reports "feeling like a teenager again" compared to her time of dx.   Dosing: 240 mg BID  Renal adjustment: no adjustment necessary  Hepatic adjustment: no adjustment necessary  Dose adjustment for toxicity:  Flushing, GI intolerance, or intolerance to maintenance dose: Consider temporary dose reduction to 120 mg twice daily (resume recommended maintenance dose of 240 mg twice daily within 4 weeks). Consider discontinuation in patients who cannot tolerate return to the maintenance dose.  Hepatic injury (suspected drug-induced), clinically significant: Discontinue treatment.  Lymphocyte count <500/mm3 persisting for >6 months: Consider treatment interruption.  Serious infection: Consider withholding treatment until infection resolves.  Current adverse effects: Flushing, skin rash, or puritus: none S/sx of infection: none  GI upset: none  S/sx of heptotoxicity: none; per pt, has blood work done q32months. No LFT abnormalities. Lymphopenia: none per pt PML/proteinuria: none   O: Labs followed q43months by Dr. Felecia Shelling. WNL per pt.   Lab Results  Component Value Date   WBC 10.1 05/29/2018   HGB 14.0 05/29/2018   HCT 42.1 05/29/2018   MCV 98.1 05/29/2018   PLT 302 05/29/2018      Chemistry      Component Value Date/Time   NA 137 05/30/2018 0550   K 4.0 05/30/2018 0550   CL 105 05/30/2018 0550   CO2 22 05/30/2018 0550   BUN 23 05/30/2018 0550   CREATININE 0.72 05/30/2018 0550      Component Value Date/Time   CALCIUM 9.2 05/30/2018 0550   ALKPHOS 73 06/23/2018 1501   AST 15 06/23/2018 1501   ALT 15 06/23/2018 1501   BILITOT 0.5 06/23/2018 1501     A/P: 1. Medication review: Patient is currently on Tecfidera for MS  and is tolerating it well. Reviewed the medication with the patient, including the following: dimethyl fumarate activates the Nrf2 pathway, which is believed to result in anti-inflammatory and cytoprotective properties. The medication is oral and should be swallowed whole. Administering with a high-fat, high-protein meal may decrease flushing and GI side effects. Possible adverse effects include skin flushing, pruritus, GI upset, albuminura, infection, lymphocytopenia, and increased liver transaminases. Cases of PML have been reported with severe, long-standing lymphopenia identified as the primary risk for PML. Dose adjustments for toxicities have been summarized above. No recommendations for any changes at this time.   Benard Halsted, PharmD, [place] [phone]

## 2020-03-13 ENCOUNTER — Other Ambulatory Visit (HOSPITAL_COMMUNITY): Payer: Self-pay | Admitting: Internal Medicine

## 2020-03-18 MED FILL — VALSARTAN-HCTZ 80-12.5 MG T: 80-12.5 | 90 days supply | Qty: 90 | Fill #0

## 2020-03-18 MED FILL — DIMETHYL FUMARATE 240 MG CP: 240 | 30 days supply | Qty: 60 | Fill #1

## 2020-04-17 MED FILL — DIMETHYL FUMARATE 240 MG CP: 240 | 30 days supply | Qty: 60 | Fill #2

## 2020-04-17 MED FILL — IBUPROFEN 600 MG TABLET: 600 | 90 days supply | Qty: 270 | Fill #1

## 2020-04-17 MED FILL — ROSUVASTATIN CALCIUM 10 MG: 10 | 90 days supply | Qty: 90 | Fill #0

## 2020-04-18 ENCOUNTER — Ambulatory Visit (HOSPITAL_COMMUNITY)
Admission: RE | Admit: 2020-04-18 | Discharge: 2020-04-18 | Disposition: A | Discharge status: Home / Self Care | Payer: 59 | Source: Ambulatory Visit | Attending: Internal Medicine | Admitting: Internal Medicine

## 2020-04-18 ENCOUNTER — Other Ambulatory Visit: Payer: Self-pay

## 2020-04-18 DIAGNOSIS — Z720 Tobacco use: Secondary | ICD-10-CM | POA: Diagnosis not present

## 2020-04-18 DIAGNOSIS — E049 Nontoxic goiter, unspecified: Secondary | ICD-10-CM | POA: Diagnosis not present

## 2020-04-18 DIAGNOSIS — Z1231 Encounter for screening mammogram for malignant neoplasm of breast: Secondary | ICD-10-CM | POA: Insufficient documentation

## 2020-04-18 DIAGNOSIS — I259 Chronic ischemic heart disease, unspecified: Secondary | ICD-10-CM | POA: Diagnosis not present

## 2020-04-18 DIAGNOSIS — F411 Generalized anxiety disorder: Secondary | ICD-10-CM | POA: Diagnosis not present

## 2020-04-18 DIAGNOSIS — F5101 Primary insomnia: Secondary | ICD-10-CM | POA: Diagnosis not present

## 2020-04-18 DIAGNOSIS — K219 Gastro-esophageal reflux disease without esophagitis: Secondary | ICD-10-CM | POA: Diagnosis not present

## 2020-04-18 DIAGNOSIS — J019 Acute sinusitis, unspecified: Secondary | ICD-10-CM | POA: Diagnosis not present

## 2020-04-18 DIAGNOSIS — E782 Mixed hyperlipidemia: Secondary | ICD-10-CM | POA: Diagnosis not present

## 2020-04-18 DIAGNOSIS — I10 Essential (primary) hypertension: Secondary | ICD-10-CM | POA: Diagnosis not present

## 2020-05-08 ENCOUNTER — Other Ambulatory Visit (HOSPITAL_COMMUNITY): Payer: Self-pay | Admitting: Student

## 2020-05-08 DIAGNOSIS — Z720 Tobacco use: Secondary | ICD-10-CM | POA: Diagnosis not present

## 2020-05-08 DIAGNOSIS — E782 Mixed hyperlipidemia: Secondary | ICD-10-CM | POA: Diagnosis not present

## 2020-05-08 DIAGNOSIS — R7303 Prediabetes: Secondary | ICD-10-CM | POA: Diagnosis not present

## 2020-05-08 DIAGNOSIS — D531 Other megaloblastic anemias, not elsewhere classified: Secondary | ICD-10-CM | POA: Diagnosis not present

## 2020-05-08 DIAGNOSIS — F411 Generalized anxiety disorder: Secondary | ICD-10-CM | POA: Diagnosis not present

## 2020-05-08 DIAGNOSIS — E559 Vitamin D deficiency, unspecified: Secondary | ICD-10-CM | POA: Diagnosis not present

## 2020-05-08 DIAGNOSIS — M19041 Primary osteoarthritis, right hand: Secondary | ICD-10-CM | POA: Diagnosis not present

## 2020-05-08 DIAGNOSIS — G35 Multiple sclerosis: Secondary | ICD-10-CM | POA: Diagnosis not present

## 2020-05-08 DIAGNOSIS — I10 Essential (primary) hypertension: Secondary | ICD-10-CM | POA: Diagnosis not present

## 2020-05-10 MED FILL — predniSONE 20 MG TABS: 20 | 6 days supply | Qty: 21 | Fill #0

## 2020-05-14 MED FILL — DIMETHYL FUMARATE 240 MG CP: 240 | 30 days supply | Qty: 60 | Fill #3

## 2020-05-15 ENCOUNTER — Telehealth: Payer: Self-pay | Admitting: Family Medicine

## 2020-05-15 NOTE — Telephone Encounter (Signed)
This should be ok, right?

## 2020-05-15 NOTE — Telephone Encounter (Signed)
Pt states she was advised by her PCP to start Lmethyl/mcnac due to being anemic, pt wants to know what Dr Felecia Shelling says about her taking this

## 2020-05-15 NOTE — Telephone Encounter (Signed)
This should be fine 

## 2020-05-16 NOTE — Telephone Encounter (Signed)
Please let her know that I have checked with Dr Felecia Shelling and we both feel that this should be fine to start. TY.

## 2020-05-16 NOTE — Telephone Encounter (Signed)
Pt. called back & was read message from RN. She verbalized understanding.

## 2020-05-16 NOTE — Telephone Encounter (Signed)
LVM informing patient that NP and Dr Felecia Shelling stated it is fine for her to start medication for anemia advised by her PCP. Left # for questions.

## 2020-05-29 ENCOUNTER — Telehealth: Payer: Self-pay | Admitting: Family Medicine

## 2020-05-29 NOTE — Telephone Encounter (Signed)
Left a voice mail for pt to call the office to pay for her "[employer]" disability form on 05/29/2020

## 2020-05-30 DIAGNOSIS — Z0289 Encounter for other administrative examinations: Secondary | ICD-10-CM

## 2020-05-31 ENCOUNTER — Other Ambulatory Visit: Payer: Self-pay

## 2020-05-31 ENCOUNTER — Encounter: Payer: Self-pay | Admitting: Emergency Medicine

## 2020-05-31 ENCOUNTER — Ambulatory Visit
Admission: EM | Admit: 2020-05-31 | Discharge: 2020-05-31 | Disposition: A | Discharge status: Home / Self Care | Payer: 59 | Attending: Family Medicine | Admitting: Family Medicine

## 2020-05-31 DIAGNOSIS — M79632 Pain in left forearm: Secondary | ICD-10-CM | POA: Diagnosis not present

## 2020-05-31 DIAGNOSIS — M5412 Radiculopathy, cervical region: Secondary | ICD-10-CM | POA: Diagnosis not present

## 2020-05-31 DIAGNOSIS — M542 Cervicalgia: Secondary | ICD-10-CM | POA: Diagnosis not present

## 2020-05-31 MED ORDER — PREDNISONE 10 MG (21) PO TBPK: ORAL_TABLET | Freq: Every day | ORAL | 0 refills | Status: AC

## 2020-05-31 MED ORDER — DEXAMETHASONE SODIUM PHOSPHATE 10 MG/ML IJ SOLN
10.0000 mg | Freq: Once | INTRAMUSCULAR | Status: AC
  Administered 2020-05-31: 10 mg via INTRAMUSCULAR

## 2020-05-31 MED ORDER — TRAMADOL HCL 50 MG PO TABS
50.0000 mg | ORAL_TABLET | Freq: Four times a day (QID) | ORAL | 0 refills | Status: DC | PRN
Start: 2020-05-31 — End: 2020-12-10

## 2020-05-31 MED ORDER — KETOROLAC TROMETHAMINE 30 MG/ML IJ SOLN
30.0000 mg | Freq: Once | INTRAMUSCULAR | Status: AC
  Administered 2020-05-31: 30 mg via INTRAMUSCULAR

## 2020-05-31 NOTE — ED Triage Notes (Addendum)
Pain to LT upper back near shoulder blade and LT forearm x 1 week.  Pt has used otc medications with no relief .  Pain is relieved when raising arm above head.

## 2020-05-31 NOTE — ED Provider Notes (Signed)
[place]   [id number] 05/31/20 Arrival Time: 1148  TD:DUKGU PAIN  SUBJECTIVE: History from: patient. Joan [name] is a 65 y.o. female complains of left neck and left forearm pain that began about a week ago. Denies a precipitating event or specific injury.  Localizes the pain to the L thoracic back. Describes the pain as constant and achy in character, with intermittent sharp pains. Has tried heat, ibuprofen and muscle relaxers with little relief. Symptoms are made worse with activity. Denies similar symptoms in the past. Denies fever, chills, erythema, ecchymosis, effusion, weakness, numbness and tingling, saddle paresthesias, loss of bowel or bladder function.      ROS: As per HPI.  All other pertinent ROS negative.     Past Medical History:  Diagnosis Date  . Colitis, ischemic ([place]) 2003  . Headaches, cluster   . Hypercholesterolemia   . Hypertension   . Insomnia   . Multiple sclerosis ([place]) 05/29/2018  . PE (pulmonary embolism)    ?birth control  . S/P colonoscopy 5427,[id number]   2004: tubular adeboma, 2007: hyperplastic polyps. Due 2012  . Seasonal allergies   . Tobacco abuse   . Tubular adenoma 2004   colonoscopy 2004   Past Surgical History:  Procedure Laterality Date  . BREAST BIOPSY Left    benign  . COLONOSCOPY  09/17/05   20 cm polyp removed/bx/middescending colon polyp removed with snare  . COLONOSCOPY  2003   tubular adenoma removed from cecum   . COLONOSCOPY  03/31/2011   RMR, diverticulosis, multiple colon polyps removed (tubular adenomas). next TCS 03/2016  . COLONOSCOPY N/A 10/22/2015   Procedure: COLONOSCOPY;  Surgeon: Daneil Dolin, MD;  Location: [facility];  Service: Endoscopy;  Laterality: N/A;  730  . ESOPHAGOGASTRODUODENOSCOPY N/A 02/15/2015   RMR, mild reflux esophagitis. empirical dilation of esophagus.   Marland Kitchen MALONEY DILATION N/A 02/15/2015   Procedure: Venia Minks DILATION;  Surgeon: Daneil Dolin, MD;  Location: [facility];   Service: Endoscopy;  Laterality: N/A;  . TUBAL LIGATION     No Known Allergies No current facility-administered medications on file prior to encounter.   Current Outpatient Medications on File Prior to Encounter  Medication Sig Dispense Refill  . calcium carbonate (TUMS - DOSED IN MG ELEMENTAL CALCIUM) 500 MG chewable tablet Chew 1 tablet by mouth daily.    . Dimethyl Fumarate 240 MG CPDR TAKE 1 CAPSULE BY MOUTH TWO TIMES DAILY 60 capsule 11  . ibuprofen (ADVIL) 600 MG tablet Take 600 mg by mouth every 6 (six) hours as needed.    . Methylfol-Methylcob-Acetylcyst (L-METHYL-MC NAC) 6-2-600 MG TABS Take 1 tablet by mouth at bedtime.    . rosuvastatin (CRESTOR) 10 MG tablet Take 10 mg by mouth daily at 6 PM.    . valsartan-hydrochlorothiazide (DIOVAN-HCT) 80-12.5 MG per tablet Take 1 tablet by mouth daily.      Marland Kitchen VITAMIN D PO Take 5,000-10,000 Units by mouth daily. alternate between taking 5,000U and10,000U each day     Social History   Socioeconomic History  . Marital status: Married    Spouse name: Marcello Moores  . Number of children: 2  . Years of education: GED  . Highest education level: Not on file  Occupational History  . Occupation: Building services engineer  Tobacco Use  . Smoking status: Current Every Day Smoker    Packs/day: 0.50    Years: 30.00    Pack years: 15.00    Types: Cigarettes  . Smokeless tobacco: Never Used  Substance and Sexual Activity  . Alcohol use: No    Alcohol/week: 0.0 standard drinks  . Drug use: No  . Sexual activity: Not on file  Other Topics Concern  . Not on file  Social History Narrative   Lives w/ husband   Caffeine use: none   Right handed   Social Determinants of Health   Financial Resource Strain: Not on file  Food Insecurity: Not on file  Transportation Needs: Not on file  Physical Activity: Not on file  Stress: Not on file  Social Connections: Not on file  Intimate Partner Violence: Not on file   Family History  Problem Relation Age of Onset   . CVA Father   . Hypertension Father     OBJECTIVE:  Vitals:   05/31/20 1202  BP: 106/61  Pulse: 84  Resp: 18  Temp: 97.9 F (36.6 C)  TempSrc: Oral  SpO2: 94%    General appearance: ALERT; in no acute distress.  Head: NCAT Lungs: Normal respiratory effort CV: pulses 2+ bilaterally. Cap refill < 2 seconds Musculoskeletal:  Inspection: Skin warm, dry, clear and intact No erythema, effusion  Palpation: L trapezius tender to palpation and in spasm ROM: Limited ROM active and passive to L shoulder and arm Skin: warm and dry Neurologic: Ambulates without difficulty; Sensation intact about the upper/ lower extremities Psychological: alert and cooperative; normal mood and affect  DIAGNOSTIC STUDIES:  No results found.   ASSESSMENT & PLAN:  1. Cervical radiculopathy   2. Neck pain on left side   3. Left forearm pain      Meds ordered this encounter  Medications  . predniSONE (STERAPRED UNI-PAK 21 TAB) 10 MG (21) TBPK tablet    Sig: Take by mouth daily for 6 days. Take 6 tablets on day 1, 5 tablets on day 2, 4 tablets on day 3, 3 tablets on day 4, 2 tablets on day 5, 1 tablet on day 6    Dispense:  21 tablet    Refill:  0    Order Specific Question:   Supervising Provider    Answer:   Chase Picket [id number]  . traMADol (ULTRAM) 50 MG tablet    Sig: Take 1 tablet (50 mg total) by mouth every 6 (six) hours as needed.    Dispense:  15 tablet    Refill:  0    Order Specific Question:   Supervising Provider    Answer:   Chase Picket [id number]  . ketorolac (TORADOL) 30 MG/ML injection 30 mg  . dexamethasone (DECADRON) injection 10 mg    Toradol 30mg  IM in office today Decadron 10mg  IM in office today Prescribed steroids Prescribed tramadol Continue conservative management of rest, ice, and gentle stretches Continue ibuprofen Continue home muscle relaxers Follow up with PCP if symptoms persist Return or go to the ER if you have any new or worsening  symptoms (fever, chills, chest pain, abdominal pain, changes in bowel or bladder habits, pain radiating into lower legs)   [place] Controlled Substances Registry consulted for this patient. I feel the risk/benefit ratio today is favorable for proceeding with this prescription for a controlled substance. Medication sedation precautions given.  Reviewed expectations re: course of current medical issues. Questions answered. Outlined signs and symptoms indicating need for more acute intervention. Patient verbalized understanding. After Visit Summary given.       Faustino Congress, NP 05/31/20 1219

## 2020-05-31 NOTE — Discharge Instructions (Signed)
I have sent in a prednisone taper for you to take for 6 days. 6 tablets on day one, 5 tablets on day two, 4 tablets on day three, 3 tablets on day four, 2 tablets on day five, and 1 tablet on day six.  I have sent in tramadol for you to take for breakthrough pain, 1 tablet every 6 hours as needed  You have received a steroid injection as well as toradol in the office today for pain and inflammation  Follow up with this office or with primary care if symptoms are persisting.  Follow up in the ER for high fever, trouble swallowing, trouble breathing, other concerning symptoms.

## 2020-06-04 ENCOUNTER — Telehealth: Payer: Self-pay | Admitting: *Deleted

## 2020-06-04 NOTE — Telephone Encounter (Signed)
[employer] FMLA form completed, reviewed and signed.  To MR.

## 2020-06-05 ENCOUNTER — Telehealth: Payer: Self-pay | Admitting: Family Medicine

## 2020-06-05 NOTE — Telephone Encounter (Signed)
[employer] form faxed 05/28/2020

## 2020-06-13 ENCOUNTER — Ambulatory Visit: Payer: 59 | Admitting: Family Medicine

## 2020-06-13 ENCOUNTER — Other Ambulatory Visit: Payer: Self-pay

## 2020-06-13 ENCOUNTER — Other Ambulatory Visit: Payer: Self-pay | Admitting: Family Medicine

## 2020-06-13 ENCOUNTER — Encounter: Payer: Self-pay | Admitting: Family Medicine

## 2020-06-13 VITALS — BP 125/68 | HR 95 | Ht 66.0 in | Wt 193.0 lb

## 2020-06-13 DIAGNOSIS — G35 Multiple sclerosis: Secondary | ICD-10-CM

## 2020-06-13 DIAGNOSIS — M79602 Pain in left arm: Secondary | ICD-10-CM

## 2020-06-13 MED ORDER — PREDNISONE 50 MG PO TABS: ORAL_TABLET | ORAL | 0 refills | Status: DC

## 2020-06-13 MED FILL — predniSONE 50 MG TABS: 50 | 3 days supply | Qty: 30 | Fill #0

## 2020-06-13 MED FILL — DIMETHYL FUMARATE 240 MG CP: 240 | 30 days supply | Qty: 60 | Fill #4

## 2020-06-13 MED FILL — VALSARTAN-HCTZ 80-12.5 MG T: 80-12.5 | 90 days supply | Qty: 90 | Fill #1

## 2020-06-13 NOTE — Patient Instructions (Signed)
Below is our plan:  We will we will continue current treatment plan. I am going to call in a higher dose of prednisone to see if this will help your arm. You will take 500mg  (10 tablets) daily for three days. This is the dose we would give in event of an exacerbation. If this does not help we need to repeat imaging. May need to consider different DMT if new lesions are found. Your imaging looked ok in 12/2019. Please monitor for side effects and let me know if you need me.   Please make sure you are staying well hydrated. I recommend 50-60 ounces daily. Well balanced diet and regular exercise encouraged. Consistent sleep schedule with 6-8 hours recommended.   Please continue follow up with care team as directed.   Follow up with Dr Felecia Shelling in 6 months   You may receive a survey regarding today's visit. I encourage you to leave honest feed back as I do use this information to improve patient care. Thank you for seeing me today!      Prednisone oral solution What is this medicine? PREDNISONE (PRED ni sone) is a corticosteroid. It is commonly used to treat inflammation of the skin, joints, lungs, and other organs. Common conditions treated include asthma, allergies, and arthritis. It is also used for other conditions, such as blood disorders and diseases of the adrenal glands. This medicine may be used for other purposes; ask your health care provider or pharmacist if you have questions. What should I tell my health care provider before I take this medicine? They need to know if you have any of these conditions:  Cushing's syndrome  diabetes  glaucoma  heart disease  high blood pressure  infection (especially a virus infection such as chickenpox, cold sores, or herpes)  kidney disease  liver disease  mental illness  myasthenia gravis  osteoporosis  seizures  stomach or intestine problems  thyroid disease  an unusual or allergic reaction to prednisone, other  corticosteroids, medicines, foods, dyes, or preservatives  pregnant or trying to get pregnant  breast-feeding How should I use this medicine? Take this medicine by mouth. Use a specially marked spoon or dropper to measure your dose. Ask your pharmacist if you do not have one. Do not use a household spoon. Follow the directions on the prescription label. Take with food or milk to avoid stomach upset. If you are taking this medicine once a day, take it in the morning. Do not take more medicine than you are told to take. Do not suddenly stop taking your medicine because you may develop a severe reaction. Your doctor will tell you how much medicine to take. If your doctor wants you to stop the medicine, the dose may be slowly lowered over time to avoid any side effects. Talk to your pediatrician regarding the use of this medicine in children. Special care may be needed. Overdosage: If you think you have taken too much of this medicine contact a poison control center or emergency room at once. NOTE: This medicine is only for you. Do not share this medicine with others. What if I miss a dose? If you miss a dose, take it as soon as you can. If it is almost time for your next dose, talk to your doctor or health care professional. You may need to miss a dose or take an extra dose. Do not take double or extra doses without advice. What may interact with this medicine? Do not take this  medicine with any of the following medications:  metyrapone  mifepristone This medicine may also interact with the following medications:  amphotericin B  aspirin and aspirin-like medicines  barbiturates  certain medicines for diabetes, like glipizide or glyburide  cholestyramine  cholinesterase inhibitors  cyclosporine  digoxin  diuretics  ephedrine  female hormones, like estrogens and birth control pills  isoniazid  ketoconazole  NSAIDS, medicines for pain and inflammation, like ibuprofen or  naproxen  phenytoin  rifampin  toxoids  vaccines  warfarin This list may not describe all possible interactions. Give your health care provider a list of all the medicines, herbs, non-prescription drugs, or dietary supplements you use. Also tell them if you smoke, drink alcohol, or use illegal drugs. Some items may interact with your medicine. What should I watch for while using this medicine? Visit your doctor or health care professional for regular checks on your progress. If you are taking this medicine over a prolonged period, carry an identification card with your name and address, the type and dose of your medicine, and your doctor's name and address. This medicine may increase your risk of getting an infection. Tell your doctor or health care professional if you are around anyone with measles or chickenpox, or if you develop sores or blisters that do not heal properly. If you are going to have surgery, tell your doctor or health care professional that you have taken this medicine within the last twelve months. Ask your doctor or health care professional about your diet. You may need to lower the amount of salt you eat. This medicine may increase blood sugar. Ask your healthcare provider if changes in diet or medicines are needed if you have diabetes. What side effects may I notice from receiving this medicine? Side effects that you should report to your doctor or health care professional as soon as possible:  allergic reactions like skin rash, itching or hives, swelling of the face, lips, or tongue  changes in emotions or moods  changes in vision  depressed mood  eye pain  fever, chills, cough, sore throat, pain or difficulty passing urine  signs and symptoms of high blood sugar such as being more thirsty or hungry or having to urinate more than normal. You may also feel very tired or have blurry vision.  swelling of ankles, feet Side effects that usually do not require  medical attention (report to your doctor or health care professional if they continue or are bothersome):  confusion, excitement, restlessness  headache  nausea, vomiting  skin problems, acne, thin and shiny skin  trouble sleeping  weight gain This list may not describe all possible side effects. Call your doctor for medical advice about side effects. You may report side effects to FDA at 1-800-FDA-1088. Where should I keep my medicine? Keep out of the reach of children in a container that small children cannot open. Store at room temperature between 15 and 30 degrees C (59 and 86 degrees F). Protect from light. Keep container tightly closed. Throw away any unused medicine after the expiration date. NOTE: This sheet is a summary. It may not cover all possible information. If you have questions about this medicine, talk to your doctor, pharmacist, or health care provider.  2021 Elsevier/Gold Standard (2017-12-28 10:59:27)   Multiple Sclerosis Multiple sclerosis (MS) is a disease of the brain, spinal cord, and optic nerves (central nervous system). It causes the body's disease-fighting (immune) system to destroy the protective covering (myelin sheath) around nerves in the  brain. When this happens, signals (nerve impulses) going to and from the brain and spinal cord do not get sent properly or may not get sent at all. There are several types of MS:  Relapsing-remitting MS. This is the most common type. This causes sudden attacks of symptoms. After an attack, you may recover completely until the next attack, or some symptoms may remain permanently.  Secondary progressive MS. This usually develops after the onset of relapsing-remitting MS. Similar to relapsing-remitting MS, this type also causes sudden attacks of symptoms. Attacks may be less frequent, but symptoms slowly get worse (progress) over time.  Primary progressive MS. This causes symptoms that steadily progress over time. This type  of MS does not cause sudden attacks of symptoms. The age of onset of MS varies, but it often develops between 2-48 years of age. MS is a lifelong (chronic) condition. There is no cure, but treatment can help slow down the progression of the disease. What are the causes? The cause of this condition is not known. What increases the risk? You are more likely to develop this condition if:  You are a woman.  You have a relative with MS. However, the condition is not passed from parent to child (inherited).  You have a lack (deficiency) of vitamin D.  You smoke. MS is more common in the [place] than in the [place]. What are the signs or symptoms? Relapsing-remitting and secondary progressive MS cause symptoms to occur in episodes or attacks that may last weeks to months. There may be long periods between attacks in which there are almost no symptoms. Primary progressive MS causes symptoms to steadily progress after they develop. Symptoms of MS vary because of the many different ways it affects the central nervous system. The main symptoms include:  Vision problems and eye pain.  Numbness and weakness.  Inability to move your arms, hands, feet, or legs (paralysis).  Balance problems.  Shaking that you cannot control (tremors).  Muscle spasms.  Problems with thinking (cognitive changes). MS can also cause symptoms that are associated with the disease, but are not always the direct result of an MS attack. They may include:  Inability to control urination or bowel movements (incontinence).  Headaches.  Fatigue.  Inability to tolerate heat.  Emotional changes.  Depression.  Pain. How is this diagnosed? This condition is diagnosed based on:  Your symptoms.  A neurological exam. This involves checking central nervous system function, such as nerve function, reflexes, and coordination.  MRIs of the brain and spinal cord.  Lab tests, including a  lumbar puncture that tests the fluid that surrounds the brain and spinal cord (cerebrospinal fluid).  Tests to measure the electrical activity of the brain in response to stimulation (evoked potentials). How is this treated? There is no cure for MS, but medicines can help decrease the number and frequency of attacks and help relieve nuisance symptoms. Treatment options may include:  Medicines that reduce the frequency of attacks. These medicines may be given by injection, by mouth (orally), or through an IV.  Medicines that reduce inflammation (steroids). These may provide short-term relief of symptoms.  Medicines to help control pain, depression, fatigue, or incontinence.  Nutritional counseling. Vitamin D supplements, if you have a deficiency.  Using devices to help you move around (assistive devices), such as braces, a cane, or a walker.  Physical therapy to strengthen and stretch your muscles.  Occupational therapy to help you with everyday tasks.  Alternative  or complementary treatments such as exercise, massage, or acupuncture.   Follow these instructions at home:  Take over-the-counter and prescription medicines only as told by your health care provider.  Do not drive or use heavy machinery while taking prescription pain medicine.  Use assistive devices as recommended by your physical therapist or your health care provider.  Exercise as directed by your health care provider.  Eating healthy can help manage MS symptoms.  Return to your normal activities as told by your health care provider. Ask your health care provider what activities are safe for you.  Reach out for support. Share your feelings with friends, family, or a support group.  Keep all follow-up visits as told by your health care provider and therapists. This is important. Where to find more information  National Multiple Sclerosis Society: https://www.nationalmssociety.[employer] of  Neurological Disorders and Stroke: http://hendricks-barton.net/  [employer]: [employer].dk Contact a health care provider if:  You feel depressed.  You develop new pain or numbness.  You have tremors.  You have problems with sexual function. Get help right away if:  You develop paralysis.  You develop numbness.  You have problems with your bladder or bowel function.  You develop double vision.  You lose vision in one or both eyes.  You develop suicidal thoughts.  You develop severe confusion. If you ever feel like you may hurt yourself or others, or have thoughts about taking your own life, get help right away. You can go to your nearest emergency department or call:  Your local emergency services (911 in the [place]  A suicide crisis helpline, such as the [facility] at (803)746-2404. This is open 24 hours a day. Summary  Multiple sclerosis (MS) is a disease of the central nervous system that causes the body's immune system to destroy the protective covering (myelin sheath) around nerves in the brain.  There are 3 types of MS: relapsing-remitting, secondary progressive, and primary progressive. Relapsing-remitting and secondary progressive MS cause symptoms to occur in episodes or attacks that may last weeks to months. Primary progressive MS causes symptoms to steadily progress after they develop.  There is no cure for MS, but medicines can help decrease the number and frequency of attacks and help relieve nuisance symptoms. Treatment may also include physical or occupational therapy.  If you develop numbness, paralysis, vision problems, or other neurological symptoms, get help right away. This information is not intended to replace advice given to you by your health care provider. Make sure you discuss any questions you have with your health care provider. Document Revised: 01/09/2020 Document  Reviewed: 01/09/2020 Elsevier Patient Education  2021 [employer].

## 2020-06-13 NOTE — Progress Notes (Signed)
I have read the note, and I agree with the clinical assessment and plan.  Richard A. Sater, MD, PhD, FAAN Certified in Neurology, Clinical Neurophysiology, Sleep Medicine, Pain Medicine and Neuroimaging  [facility] [address], [address] , [place] [address] [phone]  
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
## 2020-06-24 ENCOUNTER — Telehealth: Payer: Self-pay | Admitting: Family Medicine

## 2020-06-24 DIAGNOSIS — M79602 Pain in left arm: Secondary | ICD-10-CM | POA: Diagnosis not present

## 2020-06-24 DIAGNOSIS — G35 Multiple sclerosis: Secondary | ICD-10-CM

## 2020-06-24 NOTE — Telephone Encounter (Signed)
Pt called, We had discussed last office visit about getting an MRI. I would to schedule an MRI. Would like a call from the nurse.

## 2020-06-24 NOTE — Telephone Encounter (Signed)
Can you find out if prednisone helped at all? What symptoms are unimproved? Any new symptoms? I will be happy to order MRI to evaluate.

## 2020-06-24 NOTE — Telephone Encounter (Signed)
From your last note, if prednisone did not help may need MRI.  She wants to go ahead.

## 2020-06-24 NOTE — Telephone Encounter (Signed)
I called pt. she stated when taking her steroids it did help.  When she came off,  the pain came back.  She saw her primary care doctor this morning.  He gave her another round of prednisone she said 6-day taper. Her pain is in upper left forearm.  She said her PCP felt like it could possibly be a cyst?? .  She wanted to go ahead and rule out MS.  I told her the MRIs done most likely would not pick that up but she wanted to continue with MRI.  Needs some sedation for having MRI.

## 2020-06-25 ENCOUNTER — Telehealth: Payer: Self-pay | Admitting: Family Medicine

## 2020-06-25 NOTE — Telephone Encounter (Signed)
Spoke to pt and relayed that orders for MRI neck and brain placed, authorization to be done if not heard back from 4-5 days to let us know. Prednisone is working per pt.

## 2020-06-25 NOTE — Telephone Encounter (Signed)
cone umr order sent to GI. No auth they will reach out to the patient to schedule.

## 2020-06-27 ENCOUNTER — Telehealth: Payer: Self-pay | Admitting: Family Medicine

## 2020-06-27 NOTE — Telephone Encounter (Signed)
Called patient and informed her that her EMR states NKDA, and she's had several MRIs in past few years with contrast. She stated she had some test and got nauseated. Unable to recall which one. I advised she could take OTC for nausea if she preferred. She asked if she could take low dose Xanax, and have a driver. I advised she may as long as she has driver. Patient verbalized understanding, appreciation.

## 2020-06-27 NOTE — Telephone Encounter (Signed)
Pt called, I am allergy to the contrast used for MRI. [facility] told me physician would need to order the medication. My MRI scheduled 3/31 at 11:10a. Would like a call from the nurse.

## 2020-07-11 ENCOUNTER — Other Ambulatory Visit (HOSPITAL_COMMUNITY): Payer: Self-pay

## 2020-07-11 ENCOUNTER — Ambulatory Visit
Admission: RE | Admit: 2020-07-11 | Discharge: 2020-07-11 | Disposition: A | Discharge status: Home / Self Care | Payer: 59 | Source: Ambulatory Visit | Attending: Family Medicine | Admitting: Family Medicine

## 2020-07-11 DIAGNOSIS — G35 Multiple sclerosis: Secondary | ICD-10-CM | POA: Diagnosis not present

## 2020-07-11 MED ORDER — GADOBENATE DIMEGLUMINE 529 MG/ML IV SOLN
18.0000 mL | Freq: Once | INTRAVENOUS | Status: AC | PRN
  Administered 2020-07-11: 18 mL via INTRAVENOUS

## 2020-07-16 ENCOUNTER — Other Ambulatory Visit (HOSPITAL_COMMUNITY): Payer: Self-pay

## 2020-07-16 MED FILL — Dimethyl Fumarate Capsule Delayed Release 240 MG: ORAL | 30 days supply | Qty: 60 | Fill #0 | Status: AC

## 2020-07-18 ENCOUNTER — Other Ambulatory Visit (HOSPITAL_COMMUNITY): Payer: Self-pay

## 2020-07-19 ENCOUNTER — Other Ambulatory Visit (HOSPITAL_COMMUNITY): Payer: Self-pay

## 2020-07-19 ENCOUNTER — Telehealth: Payer: Self-pay | Admitting: Family Medicine

## 2020-07-19 NOTE — Telephone Encounter (Signed)
Pt is needing a refill on her Dimethyl Fumarate 240 MG CPDR sent in to the [facility] states that the pharmacy informed her that she is needing an authorization for this medication. Pt only has 5 days left.

## 2020-07-22 NOTE — Telephone Encounter (Signed)
KeyChristoper Fabian - PA Case ID: [id number]. Initiated Dimethyl Fumarate 240mg  po bid. Determination pending. (urgent request).

## 2020-07-23 NOTE — Telephone Encounter (Signed)
Received approval for Dimethyl Fumarate 240mg  caps 07-22-20 thru 07-21-2021. Sent approval to pharmacy.

## 2020-07-24 ENCOUNTER — Other Ambulatory Visit (HOSPITAL_COMMUNITY): Payer: Self-pay

## 2020-08-03 IMAGING — CR DG LUMBAR SPINE COMPLETE 4+V
5 series · 5 of 5 positions shown · non-contrast
Comparison: 02/12/2009

CLINICAL DATA: Bilateral lower extremity weakness beginning
yesterday.

EXAM:
LUMBAR SPINE - COMPLETE 4+ VIEW

[l-spine ap]
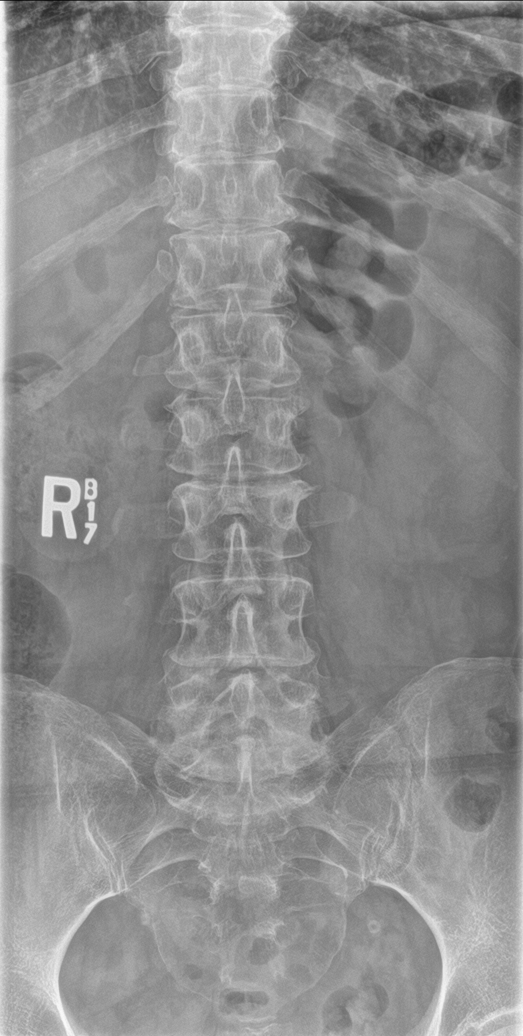

[l-spine obl (1 of 2)]
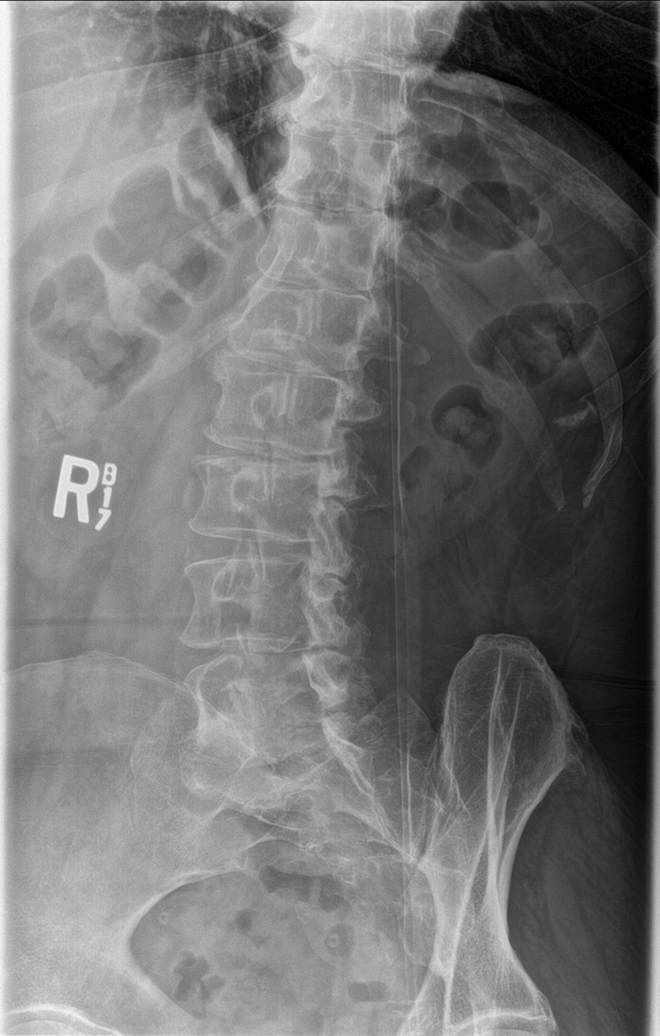

[l-spine obl (2 of 2)]
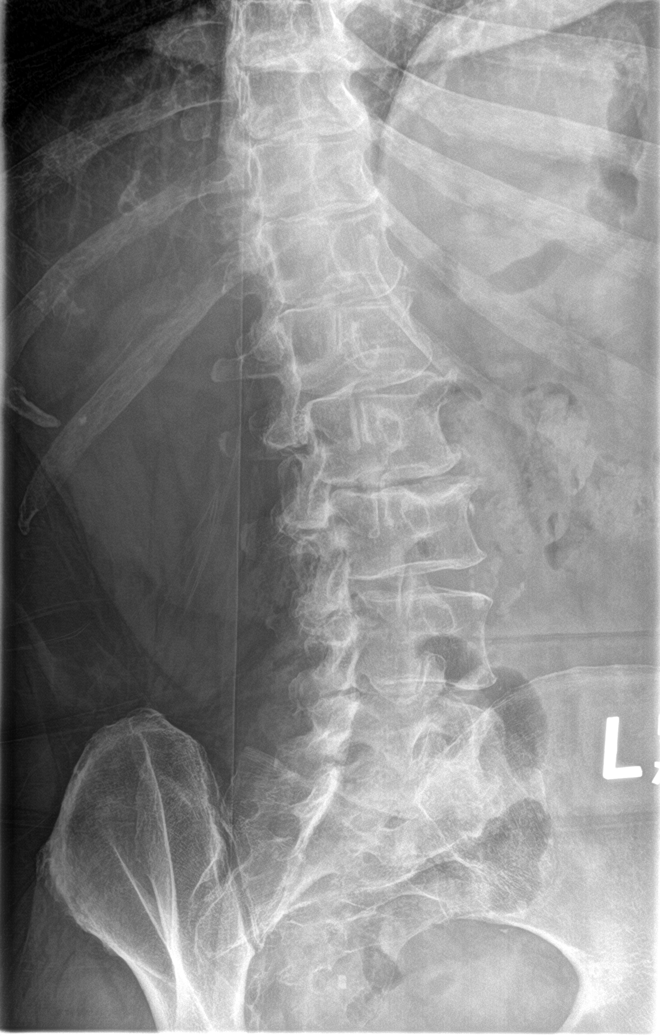

[l-spine lat]
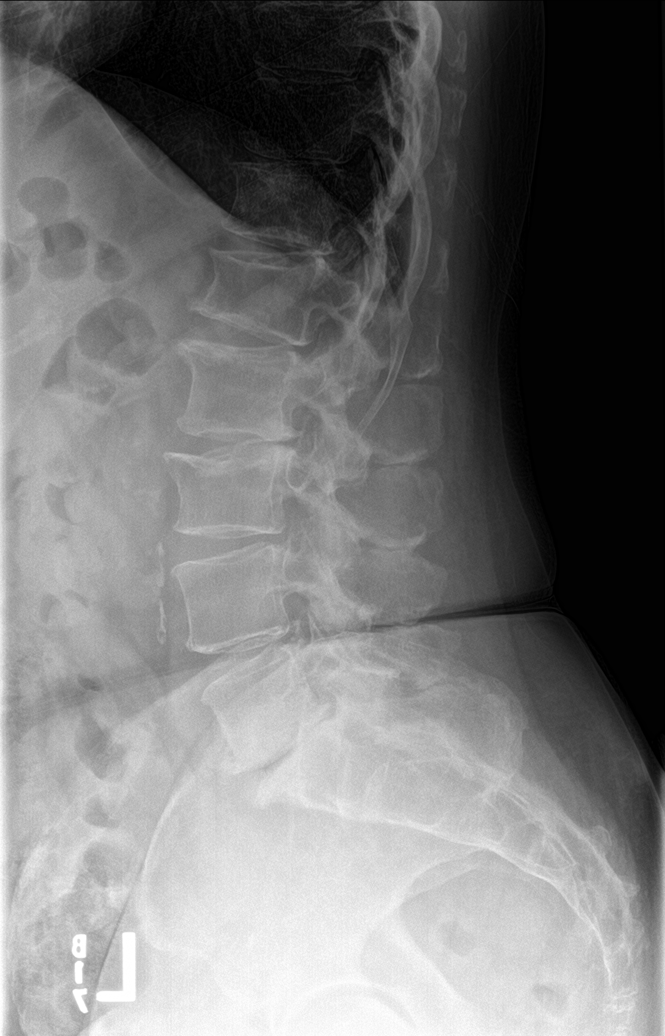

[l-spine spot]
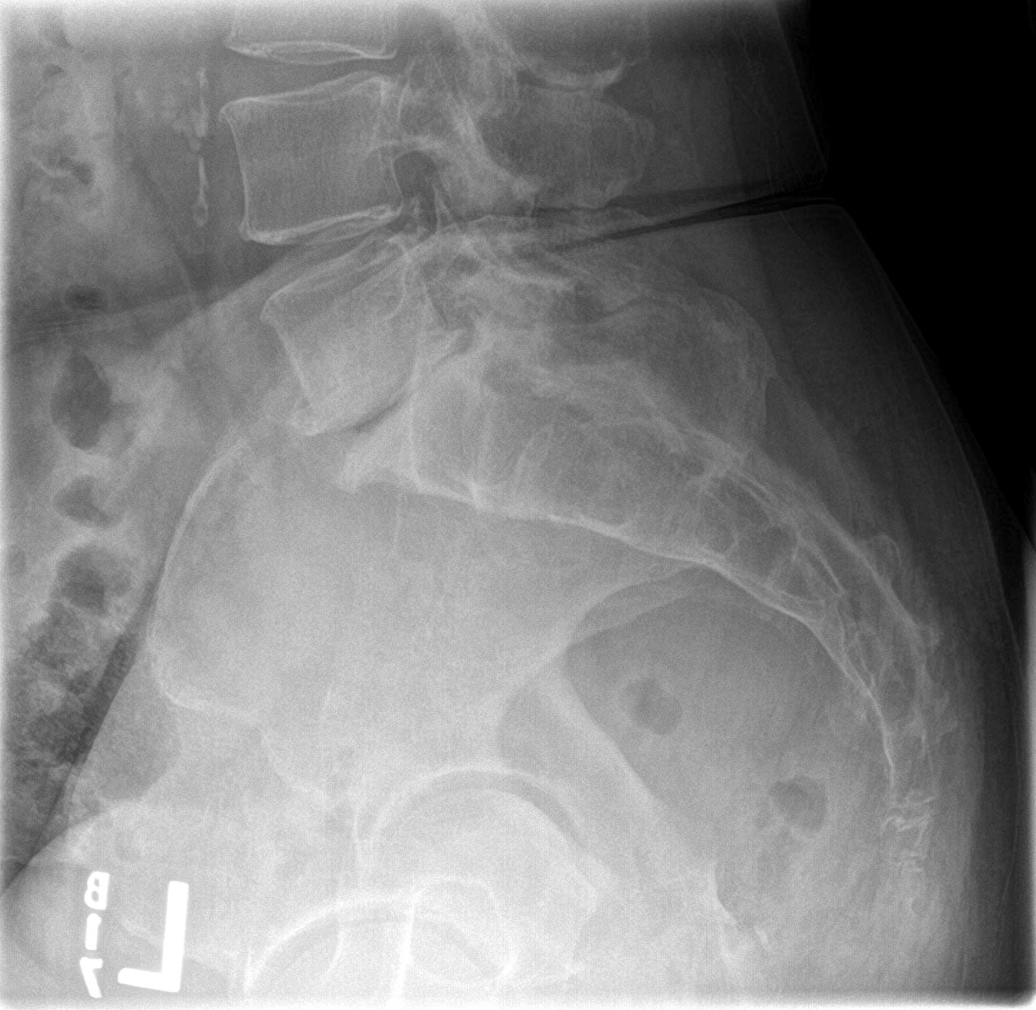

[5 of 5 positions shown; findings below may reference images not displayed]

FINDINGS: There are 5 non rib-bearing lumbar type vertebral bodies.

Normal alignment of lumbar spine. No anterolisthesis or
retrolisthesis. No definite pars defects.

Mild (approximately 25%) compression deformities involving the
superior endplates of L1 and L3 are unchanged compared to the
[DATE] examination. Remaining lumbar vertebral body heights appear
preserved

Moderate multilevel lumbar spine DDD, worse at L1-L2, L2-L3 and
L5-S1 with disc space height loss, endplate irregularity and small
posteriorly directed disc osteophyte complexes at these locations,
progressed compared to the [DATE] examination.

Limited visualization of the bilateral SI joints is normal.
Atherosclerotic plaque within the abdominal aorta.

Regional bowel gas pattern and soft tissues are normal.
IMPRESSION: 1. Moderate multilevel lumbar spine DDD, progressed compared to the
[DATE] examination.
2. Mild (approximately 5%) compression deformities involving the
superior endplates of L1 and L2, similar to the [DATE] examination.
3.  Aortic Atherosclerosis ([id number]).

## 2020-08-09 DIAGNOSIS — K219 Gastro-esophageal reflux disease without esophagitis: Secondary | ICD-10-CM | POA: Diagnosis not present

## 2020-08-09 DIAGNOSIS — E782 Mixed hyperlipidemia: Secondary | ICD-10-CM | POA: Diagnosis not present

## 2020-08-09 DIAGNOSIS — I259 Chronic ischemic heart disease, unspecified: Secondary | ICD-10-CM | POA: Diagnosis not present

## 2020-08-09 DIAGNOSIS — E049 Nontoxic goiter, unspecified: Secondary | ICD-10-CM | POA: Diagnosis not present

## 2020-08-09 DIAGNOSIS — I10 Essential (primary) hypertension: Secondary | ICD-10-CM | POA: Diagnosis not present

## 2020-08-09 DIAGNOSIS — F5101 Primary insomnia: Secondary | ICD-10-CM | POA: Diagnosis not present

## 2020-08-09 DIAGNOSIS — Z720 Tobacco use: Secondary | ICD-10-CM | POA: Diagnosis not present

## 2020-08-09 DIAGNOSIS — J019 Acute sinusitis, unspecified: Secondary | ICD-10-CM | POA: Diagnosis not present

## 2020-08-09 DIAGNOSIS — F411 Generalized anxiety disorder: Secondary | ICD-10-CM | POA: Diagnosis not present

## 2020-08-12 ENCOUNTER — Other Ambulatory Visit (HOSPITAL_COMMUNITY): Payer: Self-pay

## 2020-08-12 MED ORDER — ROSUVASTATIN CALCIUM 10 MG PO TABS
10.0000 mg | ORAL_TABLET | Freq: Every day | ORAL | 1 refills | Status: DC
  Filled 2020-08-12 – 2020-08-14 (×2): qty 90, 90d supply, fill #0
  Filled 2020-11-08: qty 90, 90d supply, fill #1

## 2020-08-12 MED FILL — Dimethyl Fumarate Capsule Delayed Release 240 MG: ORAL | 30 days supply | Qty: 60 | Fill #1 | Status: CN

## 2020-08-13 ENCOUNTER — Other Ambulatory Visit (HOSPITAL_COMMUNITY): Payer: Self-pay

## 2020-08-14 ENCOUNTER — Other Ambulatory Visit (HOSPITAL_COMMUNITY): Payer: Self-pay

## 2020-08-19 ENCOUNTER — Other Ambulatory Visit (HOSPITAL_COMMUNITY): Payer: Self-pay

## 2020-08-19 MED FILL — Dimethyl Fumarate Capsule Delayed Release 240 MG: ORAL | 30 days supply | Qty: 60 | Fill #1 | Status: AC

## 2020-09-16 ENCOUNTER — Other Ambulatory Visit (HOSPITAL_COMMUNITY): Payer: Self-pay

## 2020-09-16 MED FILL — Dimethyl Fumarate Capsule Delayed Release 240 MG: ORAL | 30 days supply | Qty: 60 | Fill #2 | Status: AC

## 2020-09-19 ENCOUNTER — Other Ambulatory Visit (HOSPITAL_COMMUNITY): Payer: Self-pay

## 2020-09-24 ENCOUNTER — Other Ambulatory Visit (HOSPITAL_COMMUNITY): Payer: Self-pay

## 2020-09-25 ENCOUNTER — Other Ambulatory Visit (HOSPITAL_COMMUNITY): Payer: Self-pay

## 2020-09-27 ENCOUNTER — Other Ambulatory Visit (HOSPITAL_COMMUNITY): Payer: Self-pay

## 2020-09-27 MED ORDER — VALSARTAN-HYDROCHLOROTHIAZIDE 80-12.5 MG PO TABS
ORAL_TABLET | ORAL | 1 refills | Status: DC
  Filled 2020-09-27: qty 90, 90d supply, fill #0

## 2020-09-27 MED ORDER — VALSARTAN-HYDROCHLOROTHIAZIDE 80-12.5 MG PO TABS
ORAL_TABLET | ORAL | 1 refills | Status: DC
  Filled 2020-09-27: qty 90, 90d supply, fill #0
  Filled 2020-12-30: qty 90, 90d supply, fill #1

## 2020-10-09 ENCOUNTER — Encounter: Payer: Self-pay | Admitting: Internal Medicine

## 2020-10-10 ENCOUNTER — Encounter: Payer: Self-pay | Admitting: Internal Medicine

## 2020-10-17 ENCOUNTER — Other Ambulatory Visit (HOSPITAL_COMMUNITY): Payer: Self-pay

## 2020-10-17 MED FILL — Dimethyl Fumarate Capsule Delayed Release 240 MG: ORAL | 30 days supply | Qty: 60 | Fill #3 | Status: AC

## 2020-10-21 ENCOUNTER — Other Ambulatory Visit (HOSPITAL_COMMUNITY): Payer: Self-pay

## 2020-10-31 DIAGNOSIS — R7303 Prediabetes: Secondary | ICD-10-CM | POA: Diagnosis not present

## 2020-10-31 DIAGNOSIS — E559 Vitamin D deficiency, unspecified: Secondary | ICD-10-CM | POA: Diagnosis not present

## 2020-10-31 DIAGNOSIS — E538 Deficiency of other specified B group vitamins: Secondary | ICD-10-CM | POA: Diagnosis not present

## 2020-10-31 DIAGNOSIS — E782 Mixed hyperlipidemia: Secondary | ICD-10-CM | POA: Diagnosis not present

## 2020-10-31 DIAGNOSIS — D509 Iron deficiency anemia, unspecified: Secondary | ICD-10-CM | POA: Diagnosis not present

## 2020-11-06 ENCOUNTER — Other Ambulatory Visit (HOSPITAL_COMMUNITY): Payer: Self-pay | Admitting: Family Medicine

## 2020-11-06 ENCOUNTER — Other Ambulatory Visit (HOSPITAL_COMMUNITY): Payer: Self-pay

## 2020-11-06 DIAGNOSIS — I10 Essential (primary) hypertension: Secondary | ICD-10-CM | POA: Diagnosis not present

## 2020-11-06 DIAGNOSIS — G35 Multiple sclerosis: Secondary | ICD-10-CM | POA: Diagnosis not present

## 2020-11-06 DIAGNOSIS — E782 Mixed hyperlipidemia: Secondary | ICD-10-CM | POA: Diagnosis not present

## 2020-11-06 DIAGNOSIS — F411 Generalized anxiety disorder: Secondary | ICD-10-CM | POA: Diagnosis not present

## 2020-11-06 DIAGNOSIS — D531 Other megaloblastic anemias, not elsewhere classified: Secondary | ICD-10-CM | POA: Diagnosis not present

## 2020-11-06 DIAGNOSIS — M19041 Primary osteoarthritis, right hand: Secondary | ICD-10-CM | POA: Diagnosis not present

## 2020-11-06 DIAGNOSIS — F17209 Nicotine dependence, unspecified, with unspecified nicotine-induced disorders: Secondary | ICD-10-CM

## 2020-11-06 DIAGNOSIS — E559 Vitamin D deficiency, unspecified: Secondary | ICD-10-CM | POA: Diagnosis not present

## 2020-11-06 DIAGNOSIS — R7303 Prediabetes: Secondary | ICD-10-CM | POA: Diagnosis not present

## 2020-11-06 DIAGNOSIS — Z720 Tobacco use: Secondary | ICD-10-CM | POA: Diagnosis not present

## 2020-11-06 MED ORDER — IBUPROFEN 600 MG PO TABS
600.0000 mg | ORAL_TABLET | Freq: Three times a day (TID) | ORAL | 2 refills | Status: DC | PRN
  Filled 2020-11-06 – 2020-11-08 (×2): qty 90, 30d supply, fill #0

## 2020-11-08 ENCOUNTER — Other Ambulatory Visit (HOSPITAL_COMMUNITY): Payer: Self-pay

## 2020-11-13 ENCOUNTER — Other Ambulatory Visit (HOSPITAL_COMMUNITY): Payer: Self-pay

## 2020-11-13 MED FILL — Dimethyl Fumarate Capsule Delayed Release 240 MG: ORAL | 30 days supply | Qty: 60 | Fill #4 | Status: AC

## 2020-11-19 ENCOUNTER — Other Ambulatory Visit (HOSPITAL_COMMUNITY): Payer: Self-pay

## 2020-12-05 ENCOUNTER — Other Ambulatory Visit: Payer: Self-pay

## 2020-12-05 ENCOUNTER — Ambulatory Visit (HOSPITAL_COMMUNITY)
Admission: RE | Admit: 2020-12-05 | Discharge: 2020-12-05 | Disposition: A | Discharge status: Home / Self Care | Payer: 59 | Source: Ambulatory Visit | Attending: Family Medicine | Admitting: Family Medicine

## 2020-12-05 DIAGNOSIS — F17209 Nicotine dependence, unspecified, with unspecified nicotine-induced disorders: Secondary | ICD-10-CM | POA: Insufficient documentation

## 2020-12-05 DIAGNOSIS — Z87891 Personal history of nicotine dependence: Secondary | ICD-10-CM | POA: Diagnosis not present

## 2020-12-10 ENCOUNTER — Other Ambulatory Visit: Payer: Self-pay

## 2020-12-10 ENCOUNTER — Ambulatory Visit (INDEPENDENT_AMBULATORY_CARE_PROVIDER_SITE_OTHER): Payer: Self-pay | Admitting: *Deleted

## 2020-12-10 VITALS — Ht 66.0 in | Wt 195.0 lb

## 2020-12-10 DIAGNOSIS — Z86010 Personal history of colonic polyps: Secondary | ICD-10-CM

## 2020-12-10 NOTE — Progress Notes (Addendum)
Gastroenterology Pre-Procedure Review  Request Date:12/10/2020 Requesting Physician: 5 year repeat, Last TCS done 10/22/2015 by Dr. Gala Romney, hyperplastic polyps  PATIENT REVIEW QUESTIONS: The patient responded to the following health history questions as indicated:    1. Diabetes Melitis: no 2. Joint replacements in the past 12 months: no 3. Major health problems in the past 3 months: no 4. Has an artificial valve or MVP: no 5. Has a defibrillator: no 6. Has been advised in past to take antibiotics in advance of a procedure like teeth cleaning: no 7. Family history of colon cancer: no  8. Alcohol Use: no 9. Illicit drug Use: no 10. History of sleep apnea: no  11. History of coronary artery or other vascular stents placed within the last 12 months: no 12. History of any prior anesthesia complications: yes, nausea 13. Body mass index is 31.47 kg/m.    MEDICATIONS & ALLERGIES:    Patient reports the following regarding taking any blood thinners:   Plavix? no Aspirin? no Coumadin? no Brilinta? no Xarelto? no Eliquis? no Pradaxa? no Savaysa? no Effient? no  Patient confirms/reports the following medications:  Current Outpatient Medications  Medication Sig Dispense Refill   calcium carbonate (TUMS - DOSED IN MG ELEMENTAL CALCIUM) 500 MG chewable tablet Chew 1 tablet by mouth as needed.     Dimethyl Fumarate 240 MG CPDR TAKE 1 CAPSULE BY MOUTH TWO TIMES DAILY 60 capsule 11   ibuprofen (ADVIL) 600 MG tablet Take 600 mg by mouth as needed.     rosuvastatin (CRESTOR) 10 MG tablet Take 1 tablet (10 mg total) by mouth daily. 90 tablet 1   valsartan-hydrochlorothiazide (DIOVAN-HCT) 80-12.5 MG per tablet Take 1 tablet by mouth daily.     valsartan-hydrochlorothiazide (DIOVAN-HCT) 80-12.5 MG tablet Take 1 tablet by mouth daily 90 tablet 1   VITAMIN D PO Take 5,000-10,000 Units by mouth daily. alternate between taking 5,000U and10,000U each day     No current facility-administered  medications for this visit.    Patient confirms/reports the following allergies:  No Known Allergies  No orders of the defined types were placed in this encounter.   AUTHORIZATION INFORMATION Primary Insurance: [employer] [employer] [place],  [place] #: [id number],  Group #: [id number] Pre-Cert / Josem Kaufmann required: No, not required  SCHEDULE INFORMATION: Procedure has been scheduled as follows:  Date: 02/05/2021, Time: 8:30 Location: [facility] with Dr. Gala Romney  This Gastroenterology Pre-Precedure Review Form is being routed to the following provider(s): Neil Crouch, PA-C

## 2020-12-10 NOTE — Progress Notes (Signed)
Pt requested Oct procedure.  Pt made aware that I will call her once Oct procedure schedules have been released.

## 2020-12-10 NOTE — Progress Notes (Signed)
If no changes, then appropriate for conscious sedation. ASA II.

## 2020-12-10 NOTE — Patient Instructions (Addendum)
[place]   Patient Name:  ARIAH MOWER Date of procedure: 02/05/2021 Time to register at [place] Stay: 8:30 am Provider:  Dr. Gala Romney  Please notify us immediately if you are diabetic, take iron supplements, or if you are on coumadin or any blood thinners.  Please hold the following medications: n/a  Note: Do NOT refrigerate or freeze CLENPIQ. CLENPIQ is ready to drink. There is no need to add any other liquid or mix the medicine in the bottle before you start dosing.    02/04/2021- 1 Day prior to procedure:    CLEAR LIQUIDS ALL DAY--NO SOLID FOODS OR DAIRY PRODUCTS! See list of liquids that are allowed and items that are NOT allowed below.  Diabetic Medication Instructions:  n/a  You must drink plenty of CLEAR LIQUIDS starting before your bowel prep. It is important to stay adequately hydrated before, during, and after your bowel prep for the prep to work effectively!  At 4:00 PM Begin the prep as follows:    Drink one bottle of pre-mixed CLENPIQ right from the bottle.  Drink at least five (5) 8-ounce drinks of clear liquids of your choice within the next 5 hours  At 10:00 PM: Drink the second bottle of pre-mixed CLENPIQ right from the bottle.   Drink at least three (3) 8-ounce drinks of clear liquids of your choice within the next 3 hours before going to bed.  Continue clear liquids.     02/05/2021- Day of Procedure:  Diabetic medications adjustments: n/a  You may take TYLENOL products.  Please continue your regular medications unless we have instructed you otherwise.    At 3 hours before procedure @  6:30 am: Stop drinking all liquids, nothing by mouth at this point.   Please note, on the day of your procedure you MUST be accompanied by an adult who is willing to assume responsibility for you at time of discharge. If you do not have such person with you, your procedure will have to be rescheduled.                                                                                                                      Please leave ALL jewelry at home prior to coming to the hospital for your procedure.   *It is your responsibility to check with your insurance company for the benefits of coverage you have for this procedure. Unfortunately, not all insurance companies have benefits to cover all or part of these types of procedures. It is your responsibility to check your benefits, however we will be glad to assist you with any codes your insurance company may need.   Please note that most insurance companies will not cover a screening colonoscopy for people under the age of 81  For example, with some insurance companies you may have benefits for a screening colonoscopy, but if polyps are found the diagnosis will change and then you may have a deductible that will need to be met. Please  make sure you check your benefits for screening colonoscopy as well as a diagnostic colonoscopy.    CLEAR LIQUIDS: (NO RED or PURPLE) Water  Jello   Apple Juice  White Grape Juice   Kool-Aid Soft drinks  Banana popsicles Sports Drink  Black coffee (No cream or milk) Tea (No cream or milk)  Broth (fat free beef/chicken/vegetable)  Clear liquids allow you to see your fingers on the other side of the glass.  Be sure they are NOT RED or PURPLE in color, cloudy, but CLEAR.  Do Not Eat: Dairy products of any kind Cranberry juice Tomato or V8 Juice  Orange Juice   Grapefruit Juice Red Grape Juice Alcohol   Non-dairy creamer Solid foods like cereal, oatmeal, yogurt, fruits, vegetables, creamed soups, eggs, bread, etc   HELPFUL HINTS TO MAKE DRINKING EASIER: -Trying drinking through a straw. -If you become nauseated, try consuming smaller amounts or stretch out the time between glasses.  Stop for 30 minutes & slowly start back drinking.  Call our office with any questions or concerns at [phone].  Thank You,  Christ Kick, [place]

## 2020-12-11 ENCOUNTER — Other Ambulatory Visit (HOSPITAL_COMMUNITY): Payer: Self-pay

## 2020-12-11 ENCOUNTER — Telehealth: Payer: Self-pay | Admitting: Internal Medicine

## 2020-12-11 MED ORDER — CLENPIQ 10-3.5-12 MG-GM -GM/160ML PO SOLN
1.0000 | Freq: Once | ORAL | 0 refills | Status: AC
  Filled 2020-12-11: qty 320, 1d supply, fill #0

## 2020-12-11 MED FILL — Dimethyl Fumarate Capsule Delayed Release 240 MG: ORAL | 30 days supply | Qty: 60 | Fill #5 | Status: AC

## 2020-12-11 NOTE — Addendum Note (Signed)
Addended by: Metro Kung on: 12/11/2020 04:54 PM   Modules accepted: Orders

## 2020-12-11 NOTE — Telephone Encounter (Signed)
Pt had a nurse visit on Tuesday. She called today asking to be scheduled in October on a Monday. She said she had labs done a month ago and could we use those instead of having more lab work done. I told her to have her PCP fax [place] her labs. Please advise. [phone]

## 2020-12-12 NOTE — Telephone Encounter (Signed)
Spoke to [place] [facility].  Labs need to be recent within a 2 week time frame of procedure.  Pt is being done by conscious sedation so no lab work required.  Pt scheduled for 02/05/2021 with arrival at 7:30.  Pt aware that I am mailing out prep instructions.

## 2020-12-12 NOTE — Progress Notes (Signed)
Pt scheduled procedure for 02/05/2021 with arrival at 7:30.  Pt aware that I am mailing out prep instructions.

## 2020-12-17 ENCOUNTER — Other Ambulatory Visit (HOSPITAL_COMMUNITY): Payer: Self-pay

## 2020-12-18 ENCOUNTER — Ambulatory Visit: Payer: 59

## 2020-12-18 ENCOUNTER — Other Ambulatory Visit: Payer: Self-pay | Admitting: *Deleted

## 2020-12-26 ENCOUNTER — Other Ambulatory Visit (HOSPITAL_COMMUNITY): Payer: Self-pay

## 2020-12-26 ENCOUNTER — Ambulatory Visit: Payer: 59 | Admitting: Neurology

## 2020-12-26 ENCOUNTER — Encounter: Payer: Self-pay | Admitting: Neurology

## 2020-12-26 VITALS — BP 116/69 | HR 88 | Ht 66.0 in | Wt 194.6 lb

## 2020-12-26 DIAGNOSIS — G35 Multiple sclerosis: Secondary | ICD-10-CM

## 2020-12-26 DIAGNOSIS — Z79899 Other long term (current) drug therapy: Secondary | ICD-10-CM | POA: Insufficient documentation

## 2020-12-26 DIAGNOSIS — R200 Anesthesia of skin: Secondary | ICD-10-CM | POA: Diagnosis not present

## 2020-12-26 MED ORDER — DIMETHYL FUMARATE 240 MG PO CPDR
1.0000 | DELAYED_RELEASE_CAPSULE | Freq: Two times a day (BID) | ORAL | 3 refills | Status: DC
  Filled 2020-12-26: qty 180, 90d supply, fill #0
  Filled 2021-01-09: qty 60, 30d supply, fill #0
  Filled 2021-02-06: qty 60, 30d supply, fill #1
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
## 2020-12-30 ENCOUNTER — Other Ambulatory Visit (HOSPITAL_COMMUNITY): Payer: Self-pay

## 2021-01-09 ENCOUNTER — Other Ambulatory Visit (HOSPITAL_COMMUNITY): Payer: Self-pay

## 2021-01-14 ENCOUNTER — Other Ambulatory Visit (HOSPITAL_COMMUNITY): Payer: Self-pay

## 2021-02-05 ENCOUNTER — Encounter (HOSPITAL_COMMUNITY): Payer: Self-pay | Admitting: Internal Medicine

## 2021-02-05 ENCOUNTER — Other Ambulatory Visit: Payer: Self-pay

## 2021-02-05 ENCOUNTER — Encounter (HOSPITAL_COMMUNITY)
Admission: RE | Disposition: A | Discharge status: Home / Self Care | Payer: Self-pay | Source: Home / Self Care | Attending: Internal Medicine

## 2021-02-05 ENCOUNTER — Ambulatory Visit (HOSPITAL_COMMUNITY)
Admission: RE | Admit: 2021-02-05 | Discharge: 2021-02-05 | Disposition: A | Discharge status: Home / Self Care | Payer: 59 | Attending: Internal Medicine | Admitting: Internal Medicine

## 2021-02-05 DIAGNOSIS — E7800 Pure hypercholesterolemia, unspecified: Secondary | ICD-10-CM | POA: Insufficient documentation

## 2021-02-05 DIAGNOSIS — I10 Essential (primary) hypertension: Secondary | ICD-10-CM | POA: Insufficient documentation

## 2021-02-05 DIAGNOSIS — K5730 Diverticulosis of large intestine without perforation or abscess without bleeding: Secondary | ICD-10-CM | POA: Insufficient documentation

## 2021-02-05 DIAGNOSIS — G35 Multiple sclerosis: Secondary | ICD-10-CM | POA: Diagnosis not present

## 2021-02-05 DIAGNOSIS — D120 Benign neoplasm of cecum: Secondary | ICD-10-CM | POA: Insufficient documentation

## 2021-02-05 DIAGNOSIS — Z86711 Personal history of pulmonary embolism: Secondary | ICD-10-CM | POA: Diagnosis not present

## 2021-02-05 DIAGNOSIS — Z79899 Other long term (current) drug therapy: Secondary | ICD-10-CM | POA: Diagnosis not present

## 2021-02-05 DIAGNOSIS — Z1211 Encounter for screening for malignant neoplasm of colon: Secondary | ICD-10-CM | POA: Insufficient documentation

## 2021-02-05 DIAGNOSIS — K640 First degree hemorrhoids: Secondary | ICD-10-CM | POA: Insufficient documentation

## 2021-02-05 DIAGNOSIS — K635 Polyp of colon: Secondary | ICD-10-CM | POA: Diagnosis not present

## 2021-02-05 DIAGNOSIS — F17210 Nicotine dependence, cigarettes, uncomplicated: Secondary | ICD-10-CM | POA: Insufficient documentation

## 2021-02-05 DIAGNOSIS — Z86010 Personal history of colonic polyps: Secondary | ICD-10-CM | POA: Insufficient documentation

## 2021-02-05 HISTORY — PX: COLONOSCOPY: SHX5424

## 2021-02-05 HISTORY — DX: Other specified postprocedural states: Z98.890

## 2021-02-05 HISTORY — PX: POLYPECTOMY: SHX5525

## 2021-02-05 HISTORY — DX: Nausea with vomiting, unspecified: R11.2

## 2021-02-05 SURGERY — COLONOSCOPY
Anesthesia: Moderate Sedation

## 2021-02-05 MED ORDER — SODIUM CHLORIDE 0.9 % IV SOLN: INTRAVENOUS | Status: DC

## 2021-02-05 MED ORDER — MIDAZOLAM HCL 5 MG/5ML IJ SOLN
INTRAMUSCULAR | Status: AC
  Filled 2021-02-05: qty 10

## 2021-02-05 MED ORDER — MIDAZOLAM HCL 5 MG/5ML IJ SOLN
INTRAMUSCULAR | Status: DC | PRN
  Administered 2021-02-05: 2 mg via INTRAVENOUS
  Administered 2021-02-05: 1 mg via INTRAVENOUS
  Administered 2021-02-05: 2 mg via INTRAVENOUS

## 2021-02-05 MED ORDER — ONDANSETRON HCL 4 MG/2ML IJ SOLN
INTRAMUSCULAR | Status: DC | PRN
  Administered 2021-02-05: 4 mg via INTRAVENOUS

## 2021-02-05 MED ORDER — ONDANSETRON HCL 4 MG/2ML IJ SOLN
INTRAMUSCULAR | Status: AC
  Filled 2021-02-05: qty 2

## 2021-02-05 MED ORDER — MEPERIDINE HCL 50 MG/ML IJ SOLN
INTRAMUSCULAR | Status: AC
  Filled 2021-02-05: qty 1

## 2021-02-05 MED ORDER — MEPERIDINE HCL 100 MG/ML IJ SOLN
INTRAMUSCULAR | Status: DC | PRN
  Administered 2021-02-05: 40 mg via INTRAVENOUS

## 2021-02-05 NOTE — Discharge Instructions (Addendum)
  Colonoscopy Discharge Instructions  Read the instructions outlined below and refer to this sheet in the next few weeks. These discharge instructions provide you with general information on caring for yourself after you leave the hospital. Your doctor may also give you specific instructions. While your treatment has been planned according to the most current medical practices available, unavoidable complications occasionally occur. If you have any problems or questions after discharge, call Dr. Gala Romney at [phone]. ACTIVITY You may resume your regular activity, but move at a slower pace for the next 24 hours.  Take frequent rest periods for the next 24 hours.  Walking will help get rid of the air and reduce the bloated feeling in your belly (abdomen).  No driving for 24 hours (because of the medicine (anesthesia) used during the test).   Do not sign any important legal documents or operate any machinery for 24 hours (because of the anesthesia used during the test).  NUTRITION Drink plenty of fluids.  You may resume your normal diet as instructed by your doctor.  Begin with a light meal and progress to your normal diet. Heavy or fried foods are harder to digest and may make you feel sick to your stomach (nauseated).  Avoid alcoholic beverages for 24 hours or as instructed.  MEDICATIONS You may resume your normal medications unless your doctor tells you otherwise.  WHAT YOU CAN EXPECT TODAY Some feelings of bloating in the abdomen.  Passage of more gas than usual.  Spotting of blood in your stool or on the toilet paper.  IF YOU HAD POLYPS REMOVED DURING THE COLONOSCOPY: No aspirin products for 7 days or as instructed.  No alcohol for 7 days or as instructed.  Eat a soft diet for the next 24 hours.  FINDING OUT THE RESULTS OF YOUR TEST Not all test results are available during your visit. If your test results are not back during the visit, make an appointment with your caregiver to find out the  results. Do not assume everything is normal if you have not heard from your caregiver or the medical facility. It is important for you to follow up on all of your test results.  SEEK IMMEDIATE MEDICAL ATTENTION IF: You have more than a spotting of blood in your stool.  Your belly is swollen (abdominal distention).  You are nauseated or vomiting.  You have a temperature over 101.  You have abdominal pain or discomfort that is severe or gets worse throughout the day.     Multiple polyps removed in your colon today  Polyp and diverticulosis information provided   Further recommendations to follow pending review of pathology report   At patient request, I called [facility] at [phone] -  reviewed findings/recommendations.

## 2021-02-05 NOTE — Op Note (Signed)
[facility] Patient Name: Joan [name] Procedure Date: 02/05/2021 9:13 AM MRN: [id number] Date of Birth: [birth year] Attending MD: Norvel Richards , MD CSN: [id number] Age: 65 Admit Type: Outpatient Procedure:                Colonoscopy Indications:              High risk colon cancer surveillance: Personal                            history of colonic polyps Providers:                Norvel Richards, MD, Charlsie Quest. Theda Sers RN, RN,                            Nelma Rothman, Technician Referring MD:              Medicines:                Midazolam 5 mg IV, Meperidine 40 mg IV Complications:            No immediate complications. Estimated Blood Loss:     Estimated blood loss was minimal. Procedure:                Pre-Anesthesia Assessment:                           - Prior to the procedure, a History and Physical                            was performed, and patient medications and                            allergies were reviewed. The patient's tolerance of                            previous anesthesia was also reviewed. The risks                            and benefits of the procedure and the sedation                            options and risks were discussed with the patient.                            All questions were answered, and informed consent                            was obtained. Prior Anticoagulants: The patient has                            taken no previous anticoagulant or antiplatelet                            agents. ASA Grade Assessment: II - A patient with  mild systemic disease. After reviewing the risks                            and benefits, the patient was deemed in                            satisfactory condition to undergo the procedure.                           After obtaining informed consent, the colonoscope                            was passed under direct vision. Throughout the                             procedure, the patient's blood pressure, pulse, and                            oxygen saturations were monitored continuously. The                            [id number]) scope was introduced through the                            anus and advanced to the the cecum, identified by                            appendiceal orifice and ileocecal valve. The                            colonoscopy was performed without difficulty. The                            patient tolerated the procedure well. The quality                            of the bowel preparation was adequate. Scope In: 0:25:42 AM Scope Out: 9:56:04 AM Scope Withdrawal Time: 0 hours 12 minutes 39 seconds  Total Procedure Duration: 0 hours 19 minutes 18 seconds  Findings:      The perianal and digital rectal examinations were normal.      Scattered medium-mouthed diverticula were found in the sigmoid colon.      Seven semi-pedunculated polyps were found in the sigmoid colon and       cecum. The polyps were 3 to 6 mm in size. These polyps were removed with       a cold snare. Resection and retrieval were complete. Estimated blood       loss was minimal.      Non-bleeding internal hemorrhoids were found during retroflexion. The       hemorrhoids were mild, small and Grade I (internal hemorrhoids that do       not prolapse). Impression:               - Diverticulosis in the sigmoid colon.                           -  Seven 3 to 6 mm polyps in the sigmoid colon and                            in the cecum, removed with a cold snare. Resected                            and retrieved.                           - Non-bleeding internal hemorrhoids. Moderate Sedation:      Moderate (conscious) sedation was administered by the endoscopy nurse       and supervised by the endoscopist. The following parameters were       monitored: oxygen saturation, heart rate, blood pressure, respiratory       rate, EKG, adequacy of pulmonary  ventilation, and response to care.       Total physician intraservice time was 26 minutes. Recommendation:           - Repeat colonoscopy after studies are complete for                            surveillance.                           - Return to GI office (date not yet determined). Procedure Code(s):        --- Professional ---                           [phone], Colonoscopy, flexible; with removal of                            tumor(s), polyp(s), or other lesion(s) by snare                            technique                           99153, Moderate sedation; each additional 15                            minutes intraservice time                           G0500, Moderate sedation services provided by the                            same physician or other qualified health care                            professional performing a gastrointestinal                            endoscopic service that sedation supports,                            requiring the presence of an independent trained  observer to assist in the monitoring of the                            patient's level of consciousness and physiological                            status; initial 15 minutes of intra-service time;                            patient age 61 years or older (additional time may                            be reported with [phone], as appropriate) Diagnosis Code(s):        --- Professional ---                           Z86.010, Personal history of colonic polyps                           K64.0, First degree hemorrhoids                           K63.5, Polyp of colon                           K57.30, Diverticulosis of large intestine without                            perforation or abscess without bleeding CPT copyright 2019 American Medical Association. All rights reserved. The codes documented in this report are preliminary and upon coder review may  be revised to meet current  compliance requirements. Cristopher Estimable. Elizardo Chilson, MD Norvel Richards, MD 02/05/2021 10:07:45 AM This report has been signed electronically. Number of Addenda: 0

## 2021-02-05 NOTE — H&P (Signed)
@LOGO @   Primary Care Physician:  Celene Squibb, MD Primary Gastroenterologist:  Dr. Gala Romney  Pre-Procedure History & Physical: HPI:  Joan [name] is a 65 y.o. female here for Surveillance colonoscopy.  Distant history of colonic adenomas removed on multiple occasions hyperplastic polyp removed 5 years ago; here for surveillance examination.  Past Medical History:  Diagnosis Date   Colitis, ischemic ([place]) 2003   Headaches, cluster    Hypercholesterolemia    Hypertension    Insomnia    Multiple sclerosis ([place]) 05/29/2018   PE (pulmonary embolism)    ?birth control   PONV (postoperative nausea and vomiting)    S/P colonoscopy [id number]   2004: tubular adeboma, 2007: hyperplastic polyps. Due 2012   Seasonal allergies    Tobacco abuse    Tubular adenoma 2004   colonoscopy 2004    Past Surgical History:  Procedure Laterality Date   BREAST BIOPSY Left    benign   COLONOSCOPY  09/17/05   20 cm polyp removed/bx/middescending colon polyp removed with snare   COLONOSCOPY  2003   tubular adenoma removed from cecum    COLONOSCOPY  03/31/2011   RMR, diverticulosis, multiple colon polyps removed (tubular adenomas). next TCS 03/2016   COLONOSCOPY N/A 10/22/2015   Procedure: COLONOSCOPY;  Surgeon: Daneil Dolin, MD;  [facility];  Service: Endoscopy;  Laterality: N/A;  730   ESOPHAGOGASTRODUODENOSCOPY N/A 02/15/2015   RMR, mild reflux esophagitis. empirical dilation of esophagus.    MALONEY DILATION N/A 02/15/2015   Procedure: Venia Minks DILATION;  Surgeon: Daneil Dolin, MD;  Location: [facility];  Service: Endoscopy;  Laterality: N/A;   TUBAL LIGATION      Prior to Admission medications   Medication Sig Start Date End Date Taking? Authorizing Provider  Cholecalciferol (VITAMIN D) 50 MCG (2000 UT) tablet Take 2,000-4,000 Units by mouth See admin instructions. Take 2000 units by mouth every other day, alternating with 4000 units on alternate days   Yes [provider]  Dimethyl Fumarate 240 MG CPDR Take 1 capsule (240 mg total) by mouth 2 (two) times daily. 12/26/20 12/26/21 Yes Sater, Nanine Means, MD  rosuvastatin (CRESTOR) 10 MG tablet Take 1 tablet (10 mg total) by mouth daily. 08/12/20  Yes   valsartan-hydrochlorothiazide (DIOVAN-HCT) 80-12.5 MG tablet Take 1 tablet by mouth daily 09/27/20  Yes     Allergies as of 12/12/2020   (No Known Allergies)    Family History  Problem Relation Age of Onset   CVA Father    Hypertension Father     Social History   Socioeconomic History   Marital status: Married    Spouse name: Marcello Moores   Number of children: 2   Years of education: GED   Highest education level: Not on file  Occupational History   Occupation: Building services engineer  Tobacco Use   Smoking status: Every Day    Packs/day: 0.50    Years: 30.00    Pack years: 15.00    Types: Cigarettes   Smokeless tobacco: Never  Vaping Use   Vaping Use: Never used  Substance and Sexual Activity   Alcohol use: No    Alcohol/week: 0.0 standard drinks   Drug use: No   Sexual activity: Not on file  Other Topics Concern   Not on file  Social History Narrative   Lives w/ husband   Caffeine use: none   Right handed   Social Determinants of Health   Financial Resource Strain: Not on file  Food Insecurity: Not on file  Transportation Needs: Not on file  Physical Activity: Not on file  Stress: Not on file  Social Connections: Not on file  Intimate Partner Violence: Not on file    Review of Systems: See HPI, otherwise negative ROS  Physical Exam: BP 117/60   Pulse 62   Temp 97.8 F (36.6 C) (Oral)   Resp (!) 21   Ht 5\' 6"  (1.676 m)   Wt 86.2 kg   SpO2 99%   BMI 30.67 kg/m  General:   Alert,  Well-developed, well-nourished, pleasant and cooperative in NAD Neck:  Supple; no masses or thyromegaly. No significant cervical adenopathy. Lungs:  Clear throughout to auscultation.   No wheezes, crackles, or rhonchi. No acute distress. Heart:   Regular rate and rhythm; no murmurs, clicks, rubs,  or gallops. Abdomen: Non-distended, normal bowel sounds.  Soft and nontender without appreciable mass or hepatosplenomegaly.  Pulses:  Normal pulses noted. Extremities:  Without clubbing or edema.  Impression/Plan:   65 year old lady with a history of colonic  adenomas removed over time.  Here for surveillance examination  per plan.The risks, benefits, limitations, alternatives and imponderables have been reviewed with the patient. Questions have been answered. All parties are agreeable.       Notice: This dictation was prepared with Dragon dictation along with smaller phrase technology. Any transcriptional errors that result from this process are unintentional and may not be corrected upon review.

## 2021-02-06 ENCOUNTER — Other Ambulatory Visit (HOSPITAL_COMMUNITY): Payer: Self-pay

## 2021-02-06 LAB — SURGICAL PATHOLOGY

## 2021-02-07 ENCOUNTER — Encounter (HOSPITAL_COMMUNITY): Payer: Self-pay | Admitting: Internal Medicine

## 2021-02-07 ENCOUNTER — Other Ambulatory Visit (HOSPITAL_COMMUNITY): Payer: Self-pay

## 2021-02-07 MED ORDER — ROSUVASTATIN CALCIUM 10 MG PO TABS
10.0000 mg | ORAL_TABLET | Freq: Every day | ORAL | 1 refills | Status: DC
  Filled 2021-02-07: qty 90, 90d supply, fill #0
  Filled 2021-05-07: qty 90, 90d supply, fill #1

## 2021-02-08 ENCOUNTER — Encounter: Payer: Self-pay | Admitting: Internal Medicine

## 2021-02-08 NOTE — Progress Notes (Signed)
.  rmr 

## 2021-02-11 ENCOUNTER — Other Ambulatory Visit (HOSPITAL_COMMUNITY): Payer: Self-pay

## 2021-02-12 ENCOUNTER — Other Ambulatory Visit: Payer: Self-pay

## 2021-02-12 ENCOUNTER — Ambulatory Visit: Payer: 59 | Attending: Internal Medicine | Admitting: Pharmacist

## 2021-02-12 ENCOUNTER — Other Ambulatory Visit (HOSPITAL_COMMUNITY): Payer: Self-pay

## 2021-02-12 DIAGNOSIS — Z79899 Other long term (current) drug therapy: Secondary | ICD-10-CM

## 2021-02-12 MED ORDER — DIMETHYL FUMARATE 240 MG PO CPDR
1.0000 | DELAYED_RELEASE_CAPSULE | Freq: Two times a day (BID) | ORAL | 3 refills | Status: DC
  Filled 2021-02-12: qty 180, 90d supply, fill #0
  Filled 2021-03-17 (×2): qty 60, 30d supply, fill #0
  Filled 2021-04-04: qty 60, 30d supply, fill #1
  Filled 2021-05-07: qty 60, 30d supply, fill #2
  Filled 2021-06-02: qty 60, 30d supply, fill #3
  Filled 2021-07-01: qty 60, 30d supply, fill #4
  Filled 2021-08-01 – 2021-08-04 (×2): qty 60, 30d supply, fill #5
  Filled 2021-08-27: qty 60, 30d supply, fill #6
  Filled 2021-09-26: qty 60, 30d supply, fill #7
  Filled 2021-10-22: qty 60, 30d supply, fill #8
  Filled 2021-11-24: qty 60, 30d supply, fill #9
  Filled 2021-12-23: qty 60, 30d supply, fill #10
  Filled 2022-01-20: qty 60, 30d supply, fill #11

## 2021-02-12 NOTE — Progress Notes (Signed)
   S: Patient presents today for review of their specialty medication.   Patient is currently taking Tecfidera (dimethyl fumarate) for MS. Patient is managed by Dr. Arlice Colt for this.   Adherence: confirms  Efficacy: pt is happy with her results.  Dosing: 240 mg BID  Renal adjustment: no adjustment necessary  Hepatic adjustment: no adjustment necessary  Dose adjustment for toxicity:  Flushing, GI intolerance, or intolerance to maintenance dose: Consider temporary dose reduction to 120 mg twice daily (resume recommended maintenance dose of 240 mg twice daily within 4 weeks). Consider discontinuation in patients who cannot tolerate return to the maintenance dose.  Hepatic injury (suspected drug-induced), clinically significant: Discontinue treatment.  Lymphocyte count <500/mm3 persisting for >6 months: Consider treatment interruption.  Serious infection: Consider withholding treatment until infection resolves.  Current adverse effects: Flushing, skin rash, or puritus: none S/sx of infection: none  GI upset: none  S/sx of heptotoxicity: none; per pt, has blood work done q80months. No LFT abnormalities. Lymphopenia: none per pt PML/proteinuria: none   O: Labs followed q45months by Dr. Felecia Shelling. WNL per pt.   Lab Results  Component Value Date   WBC 10.1 05/29/2018   HGB 14.0 05/29/2018   HCT 42.1 05/29/2018   MCV 98.1 05/29/2018   PLT 302 05/29/2018      Chemistry      Component Value Date/Time   NA 137 05/30/2018 0550   K 4.0 05/30/2018 0550   CL 105 05/30/2018 0550   CO2 22 05/30/2018 0550   BUN 23 05/30/2018 0550   CREATININE 0.72 05/30/2018 0550      Component Value Date/Time   CALCIUM 9.2 05/30/2018 0550   ALKPHOS 73 06/23/2018 1501   AST 15 06/23/2018 1501   ALT 15 06/23/2018 1501   BILITOT 0.5 06/23/2018 1501     A/P: 1. Medication review: Patient is currently on Tecfidera for MS and is tolerating it well. Reviewed the medication with the patient,  including the following: dimethyl fumarate activates the Nrf2 pathway, which is believed to result in anti-inflammatory and cytoprotective properties. The medication is oral and should be swallowed whole. Administering with a high-fat, high-protein meal may decrease flushing and GI side effects. Possible adverse effects include skin flushing, pruritus, GI upset, albuminura, infection, lymphocytopenia, and increased liver transaminases. Cases of PML have been reported with severe, long-standing lymphopenia identified as the primary risk for PML. Dose adjustments for toxicities have been summarized above. No recommendations for any changes at this time.   Benard Halsted, PharmD, [facility] [place], [place] [phone]

## 2021-03-10 ENCOUNTER — Other Ambulatory Visit (HOSPITAL_COMMUNITY): Payer: Self-pay

## 2021-03-13 ENCOUNTER — Other Ambulatory Visit (HOSPITAL_COMMUNITY): Payer: Self-pay

## 2021-03-13 MED ORDER — VALSARTAN-HYDROCHLOROTHIAZIDE 80-12.5 MG PO TABS
1.0000 | ORAL_TABLET | Freq: Every day | ORAL | 1 refills | Status: DC
  Filled 2021-03-13: qty 90, 90d supply, fill #0
  Filled 2021-03-24 – 2021-06-02 (×2): qty 90, 90d supply, fill #1

## 2021-03-17 ENCOUNTER — Other Ambulatory Visit (HOSPITAL_COMMUNITY): Payer: Self-pay

## 2021-03-18 ENCOUNTER — Other Ambulatory Visit (HOSPITAL_COMMUNITY): Payer: Self-pay

## 2021-03-19 ENCOUNTER — Other Ambulatory Visit (HOSPITAL_COMMUNITY): Payer: Self-pay

## 2021-03-20 ENCOUNTER — Other Ambulatory Visit (HOSPITAL_COMMUNITY): Payer: Self-pay

## 2021-03-20 MED ORDER — IBUPROFEN 600 MG PO TABS
ORAL_TABLET | ORAL | 2 refills | Status: DC
  Filled 2021-03-20: qty 90, 30d supply, fill #0

## 2021-03-21 ENCOUNTER — Other Ambulatory Visit (HOSPITAL_COMMUNITY): Payer: Self-pay

## 2021-03-24 ENCOUNTER — Other Ambulatory Visit (HOSPITAL_COMMUNITY): Payer: Self-pay

## 2021-04-01 ENCOUNTER — Other Ambulatory Visit (HOSPITAL_COMMUNITY): Payer: Self-pay

## 2021-04-03 ENCOUNTER — Other Ambulatory Visit (HOSPITAL_COMMUNITY): Payer: Self-pay | Admitting: Internal Medicine

## 2021-04-03 DIAGNOSIS — Z1231 Encounter for screening mammogram for malignant neoplasm of breast: Secondary | ICD-10-CM

## 2021-04-04 ENCOUNTER — Other Ambulatory Visit (HOSPITAL_COMMUNITY): Payer: Self-pay

## 2021-04-15 ENCOUNTER — Other Ambulatory Visit (HOSPITAL_COMMUNITY): Payer: Self-pay

## 2021-04-23 ENCOUNTER — Ambulatory Visit (HOSPITAL_COMMUNITY)
Admission: RE | Admit: 2021-04-23 | Discharge: 2021-04-23 | Disposition: A | Discharge status: Home / Self Care | Payer: 59 | Source: Ambulatory Visit | Attending: Internal Medicine | Admitting: Internal Medicine

## 2021-04-23 ENCOUNTER — Other Ambulatory Visit: Payer: Self-pay

## 2021-04-23 DIAGNOSIS — Z1231 Encounter for screening mammogram for malignant neoplasm of breast: Secondary | ICD-10-CM | POA: Insufficient documentation

## 2021-04-24 DIAGNOSIS — H5203 Hypermetropia, bilateral: Secondary | ICD-10-CM | POA: Diagnosis not present

## 2021-05-07 ENCOUNTER — Other Ambulatory Visit (HOSPITAL_COMMUNITY): Payer: Self-pay

## 2021-05-08 ENCOUNTER — Other Ambulatory Visit (HOSPITAL_COMMUNITY): Payer: Self-pay

## 2021-05-08 DIAGNOSIS — R7303 Prediabetes: Secondary | ICD-10-CM | POA: Diagnosis not present

## 2021-05-08 DIAGNOSIS — I10 Essential (primary) hypertension: Secondary | ICD-10-CM | POA: Diagnosis not present

## 2021-05-08 DIAGNOSIS — E559 Vitamin D deficiency, unspecified: Secondary | ICD-10-CM | POA: Diagnosis not present

## 2021-05-12 ENCOUNTER — Other Ambulatory Visit (HOSPITAL_COMMUNITY): Payer: Self-pay

## 2021-05-12 DIAGNOSIS — G35 Multiple sclerosis: Secondary | ICD-10-CM | POA: Diagnosis not present

## 2021-05-12 DIAGNOSIS — Z0001 Encounter for general adult medical examination with abnormal findings: Secondary | ICD-10-CM | POA: Diagnosis not present

## 2021-05-12 DIAGNOSIS — D531 Other megaloblastic anemias, not elsewhere classified: Secondary | ICD-10-CM | POA: Diagnosis not present

## 2021-05-12 DIAGNOSIS — E782 Mixed hyperlipidemia: Secondary | ICD-10-CM | POA: Diagnosis not present

## 2021-05-12 DIAGNOSIS — M19041 Primary osteoarthritis, right hand: Secondary | ICD-10-CM | POA: Diagnosis not present

## 2021-05-12 DIAGNOSIS — F411 Generalized anxiety disorder: Secondary | ICD-10-CM | POA: Diagnosis not present

## 2021-05-12 DIAGNOSIS — I10 Essential (primary) hypertension: Secondary | ICD-10-CM | POA: Diagnosis not present

## 2021-05-12 DIAGNOSIS — E559 Vitamin D deficiency, unspecified: Secondary | ICD-10-CM | POA: Diagnosis not present

## 2021-05-12 DIAGNOSIS — Z720 Tobacco use: Secondary | ICD-10-CM | POA: Diagnosis not present

## 2021-05-12 MED ORDER — PREDNISONE 10 MG (21) PO TBPK
ORAL_TABLET | ORAL | 0 refills | Status: DC
  Filled 2021-05-13: qty 21, 6d supply, fill #0

## 2021-05-13 ENCOUNTER — Other Ambulatory Visit (HOSPITAL_COMMUNITY): Payer: Self-pay

## 2021-05-21 DIAGNOSIS — Z0289 Encounter for other administrative examinations: Secondary | ICD-10-CM

## 2021-05-26 ENCOUNTER — Telehealth: Payer: Self-pay

## 2021-05-26 NOTE — Telephone Encounter (Signed)
Received FMLA forms from [employer]. Completed and given to Dr. Felecia Shelling for review and signature upon his return to the office tomorrow.

## 2021-05-27 NOTE — Telephone Encounter (Signed)
Gave completed/signed form back to medical records to process for pt. 

## 2021-05-29 NOTE — Telephone Encounter (Signed)
I faxed pt FMLA form on 05/27/21 back to [employer]

## 2021-05-31 DIAGNOSIS — U071 COVID-19: Secondary | ICD-10-CM | POA: Diagnosis not present

## 2021-06-02 ENCOUNTER — Other Ambulatory Visit (HOSPITAL_COMMUNITY): Payer: Self-pay

## 2021-06-05 ENCOUNTER — Other Ambulatory Visit (HOSPITAL_COMMUNITY): Payer: Self-pay

## 2021-06-25 ENCOUNTER — Encounter: Payer: Self-pay | Admitting: Neurology

## 2021-06-25 ENCOUNTER — Other Ambulatory Visit: Payer: Self-pay

## 2021-06-25 ENCOUNTER — Ambulatory Visit: Payer: 59 | Admitting: Neurology

## 2021-06-25 VITALS — BP 135/69 | HR 90 | Ht 66.0 in | Wt 194.5 lb

## 2021-06-25 DIAGNOSIS — G35 Multiple sclerosis: Secondary | ICD-10-CM

## 2021-06-25 DIAGNOSIS — R200 Anesthesia of skin: Secondary | ICD-10-CM

## 2021-06-25 DIAGNOSIS — Z79899 Other long term (current) drug therapy: Secondary | ICD-10-CM | POA: Diagnosis not present

## 2021-06-25 DIAGNOSIS — R269 Unspecified abnormalities of gait and mobility: Secondary | ICD-10-CM | POA: Diagnosis not present
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
## 2021-06-30 ENCOUNTER — Telehealth: Payer: Self-pay | Admitting: Neurology

## 2021-06-30 NOTE — Telephone Encounter (Signed)
Called and spoke w/ pt. When she had appt last week, MD wrote her a letter: "Joan [name] has Joan invading brain and spinal cord. Due to aggressive physical impairments,left left weakness, bladder incontinence, fatigue she is disabled and unable to work." ?However, she is now asking for STD to be filled out. Needs to keep insurance. She will contact [employer] to have them fax form for [place] to complete. ?

## 2021-06-30 NOTE — Telephone Encounter (Signed)
Pt is asking for a call from RN to request to revision to a letter for insurance  ?

## 2021-07-01 ENCOUNTER — Other Ambulatory Visit (HOSPITAL_COMMUNITY): Payer: Self-pay

## 2021-07-01 DIAGNOSIS — Z0281 Encounter for paternity testing: Secondary | ICD-10-CM

## 2021-07-03 ENCOUNTER — Telehealth: Payer: Self-pay | Admitting: *Deleted

## 2021-07-03 NOTE — Telephone Encounter (Signed)
I faxed pt [place] form on 07/03/2021 ?

## 2021-07-09 ENCOUNTER — Other Ambulatory Visit (HOSPITAL_COMMUNITY): Payer: Self-pay

## 2021-07-09 DIAGNOSIS — Z0289 Encounter for other administrative examinations: Secondary | ICD-10-CM

## 2021-07-10 DIAGNOSIS — G35 Multiple sclerosis: Secondary | ICD-10-CM | POA: Diagnosis not present

## 2021-07-10 DIAGNOSIS — R059 Cough, unspecified: Secondary | ICD-10-CM | POA: Diagnosis not present

## 2021-07-10 DIAGNOSIS — J029 Acute pharyngitis, unspecified: Secondary | ICD-10-CM | POA: Diagnosis not present

## 2021-07-10 DIAGNOSIS — U071 COVID-19: Secondary | ICD-10-CM | POA: Diagnosis not present

## 2021-07-10 DIAGNOSIS — R0981 Nasal congestion: Secondary | ICD-10-CM | POA: Diagnosis not present

## 2021-07-15 NOTE — Telephone Encounter (Signed)
Called pt back. Advised Dr. Felecia Shelling was reviewing STD form. He states if she is wanting STD, we would need to order PT and have her f/u in 4 months for him to re-evaluate on things are going to determine if she stays on disability or not.  ? ?She went on to say she got approval for STD. Aware we had not sent in forms yet. She is going to call HR department to see if Dr. Felecia Shelling still needs to fill this out. She also is being told FMLA/continuous leave needs completing. She will ask if this needs to be done if approved for STD and call back to let us know. ?

## 2021-08-01 ENCOUNTER — Other Ambulatory Visit (HOSPITAL_COMMUNITY): Payer: Self-pay

## 2021-08-01 MED ORDER — ROSUVASTATIN CALCIUM 10 MG PO TABS
10.0000 mg | ORAL_TABLET | Freq: Every day | ORAL | 1 refills | Status: DC
  Filled 2021-08-01: qty 90, 90d supply, fill #0
  Filled 2022-02-02: qty 90, 90d supply, fill #1

## 2021-08-04 ENCOUNTER — Telehealth: Payer: Self-pay | Admitting: *Deleted

## 2021-08-04 ENCOUNTER — Other Ambulatory Visit (HOSPITAL_COMMUNITY): Payer: Self-pay

## 2021-08-04 NOTE — Telephone Encounter (Signed)
Received fax from [place] that [place] dimethyl fumarate approved 08/02/21-08/02/22. PA ref # [id number]. ?

## 2021-08-05 ENCOUNTER — Other Ambulatory Visit (HOSPITAL_COMMUNITY): Payer: Self-pay

## 2021-08-07 ENCOUNTER — Other Ambulatory Visit (HOSPITAL_COMMUNITY): Payer: Self-pay

## 2021-08-26 ENCOUNTER — Other Ambulatory Visit (HOSPITAL_COMMUNITY): Payer: Self-pay

## 2021-08-27 ENCOUNTER — Other Ambulatory Visit (HOSPITAL_COMMUNITY): Payer: Self-pay

## 2021-09-03 ENCOUNTER — Other Ambulatory Visit (HOSPITAL_COMMUNITY): Payer: Self-pay

## 2021-09-23 ENCOUNTER — Other Ambulatory Visit (HOSPITAL_COMMUNITY): Payer: Self-pay

## 2021-09-24 ENCOUNTER — Other Ambulatory Visit (HOSPITAL_COMMUNITY): Payer: Self-pay

## 2021-09-24 MED ORDER — VALSARTAN-HYDROCHLOROTHIAZIDE 80-12.5 MG PO TABS
1.0000 | ORAL_TABLET | Freq: Every day | ORAL | 1 refills | Status: DC
  Filled 2021-09-24: qty 90, 90d supply, fill #0
  Filled 2021-12-17: qty 90, 90d supply, fill #1

## 2021-09-26 ENCOUNTER — Other Ambulatory Visit (HOSPITAL_COMMUNITY): Payer: Self-pay

## 2021-10-03 ENCOUNTER — Other Ambulatory Visit (HOSPITAL_COMMUNITY): Payer: Self-pay

## 2021-10-22 ENCOUNTER — Other Ambulatory Visit (HOSPITAL_COMMUNITY): Payer: Self-pay

## 2021-10-27 ENCOUNTER — Other Ambulatory Visit (HOSPITAL_COMMUNITY): Payer: Self-pay

## 2021-11-03 DIAGNOSIS — R7303 Prediabetes: Secondary | ICD-10-CM | POA: Diagnosis not present

## 2021-11-03 DIAGNOSIS — E782 Mixed hyperlipidemia: Secondary | ICD-10-CM | POA: Diagnosis not present

## 2021-11-03 DIAGNOSIS — I10 Essential (primary) hypertension: Secondary | ICD-10-CM | POA: Diagnosis not present

## 2021-11-03 DIAGNOSIS — E559 Vitamin D deficiency, unspecified: Secondary | ICD-10-CM | POA: Diagnosis not present

## 2021-11-10 ENCOUNTER — Other Ambulatory Visit (HOSPITAL_COMMUNITY): Payer: Self-pay

## 2021-11-10 ENCOUNTER — Other Ambulatory Visit: Payer: Self-pay | Admitting: Family Medicine

## 2021-11-10 ENCOUNTER — Other Ambulatory Visit (HOSPITAL_COMMUNITY): Payer: Self-pay | Admitting: Family Medicine

## 2021-11-10 DIAGNOSIS — Z720 Tobacco use: Secondary | ICD-10-CM | POA: Diagnosis not present

## 2021-11-10 DIAGNOSIS — F411 Generalized anxiety disorder: Secondary | ICD-10-CM | POA: Diagnosis not present

## 2021-11-10 DIAGNOSIS — Z0271 Encounter for disability determination: Secondary | ICD-10-CM

## 2021-11-10 DIAGNOSIS — E782 Mixed hyperlipidemia: Secondary | ICD-10-CM | POA: Diagnosis not present

## 2021-11-10 DIAGNOSIS — I10 Essential (primary) hypertension: Secondary | ICD-10-CM | POA: Diagnosis not present

## 2021-11-10 DIAGNOSIS — G35 Multiple sclerosis: Secondary | ICD-10-CM | POA: Diagnosis not present

## 2021-11-10 DIAGNOSIS — D531 Other megaloblastic anemias, not elsewhere classified: Secondary | ICD-10-CM | POA: Diagnosis not present

## 2021-11-10 DIAGNOSIS — R87610 Atypical squamous cells of undetermined significance on cytologic smear of cervix (ASC-US): Secondary | ICD-10-CM | POA: Diagnosis not present

## 2021-11-10 DIAGNOSIS — M19041 Primary osteoarthritis, right hand: Secondary | ICD-10-CM | POA: Diagnosis not present

## 2021-11-10 DIAGNOSIS — Z01419 Encounter for gynecological examination (general) (routine) without abnormal findings: Secondary | ICD-10-CM | POA: Diagnosis not present

## 2021-11-10 DIAGNOSIS — E559 Vitamin D deficiency, unspecified: Secondary | ICD-10-CM | POA: Diagnosis not present

## 2021-11-10 DIAGNOSIS — R7303 Prediabetes: Secondary | ICD-10-CM | POA: Diagnosis not present

## 2021-11-10 MED ORDER — ROSUVASTATIN CALCIUM 10 MG PO TABS
10.0000 mg | ORAL_TABLET | Freq: Every day | ORAL | 2 refills | Status: DC
  Filled 2021-11-10 (×2): qty 90, 90d supply, fill #0

## 2021-11-10 MED ORDER — PREDNISONE 10 MG (21) PO TBPK
ORAL_TABLET | ORAL | 0 refills | Status: DC
  Filled 2021-11-10 (×2): qty 21, 6d supply, fill #0

## 2021-11-24 ENCOUNTER — Other Ambulatory Visit (HOSPITAL_COMMUNITY): Payer: Self-pay

## 2021-12-01 ENCOUNTER — Other Ambulatory Visit (HOSPITAL_COMMUNITY): Payer: Self-pay

## 2021-12-10 ENCOUNTER — Ambulatory Visit: Payer: 59 | Admitting: Obstetrics & Gynecology

## 2021-12-10 ENCOUNTER — Encounter: Payer: Self-pay | Admitting: Obstetrics & Gynecology

## 2021-12-10 VITALS — BP 106/61 | HR 82 | Ht 66.0 in | Wt 200.0 lb

## 2021-12-10 DIAGNOSIS — R87610 Atypical squamous cells of undetermined significance on cytologic smear of cervix (ASC-US): Secondary | ICD-10-CM | POA: Diagnosis not present

## 2021-12-10 DIAGNOSIS — Z780 Asymptomatic menopausal state: Secondary | ICD-10-CM

## 2021-12-10 NOTE — Progress Notes (Signed)
   GYN VISIT Patient name: BELLAROSE BURTT MRN [id number]  Date of birth: 05/07/1955 Chief Complaint:   Results (Discuss pap results)  History of Present Illness:   Joan [name] is a 67 y.o. PM female being seen today for consultation due to abnormal pap.  Recent pap 7/31: ASCUS, HPV negative  Pt denies h/o abnormal pap smears.  Denies postcoital or any vaginal bleeding.  Denies pelvic or abdominal pain.  Denies irregular discharge, itching or irritation.  No acute gyn concerns.    No LMP recorded. Patient is postmenopausal.      No data to display           Review of Systems:   Pertinent items are noted in HPI Denies fever/chills, dizziness, headaches, visual disturbances, fatigue, shortness of breath, chest pain, abdominal pain, vomiting, no problems with bowel movements, urination, or intercourse unless otherwise stated above.  Pertinent History Reviewed:  Reviewed past medical,surgical, social, obstetrical and family history.  Reviewed problem list, medications and allergies. Physical Assessment:   Vitals:   12/10/21 0904  BP: 106/61  Pulse: 82  Weight: 200 lb (90.7 kg)  Height: '5\' 6"'$  (1.676 m)  Body mass index is 32.28 kg/m.       Physical Examination:   General appearance: alert, well appearing, and in no distress  Psych: mood appropriate, normal affect  Skin: warm & dry   Cardiovascular: normal heart rate noted  Respiratory: normal respiratory effort, no distress  Chaperone: N/A    Assessment & Plan:  1) ASCUS, HPV neg -Reviewed ASCCP guidelines -plan for repeat cotesting in 3 yr -no further work up warranted at this time.  Discussed continued monitoring x 20yror at least 3 negative results -questions/concerns were addressed   No orders of the defined types were placed in this encounter.   No follow-ups on file.   JJanyth Pupa DO Attending [place] [facility] [employer]De[employer] CParker

## 2021-12-11 ENCOUNTER — Ambulatory Visit (HOSPITAL_COMMUNITY)
Admission: RE | Admit: 2021-12-11 | Discharge: 2021-12-11 | Disposition: A | Discharge status: Home / Self Care | Payer: 59 | Source: Ambulatory Visit | Attending: Family Medicine | Admitting: Family Medicine

## 2021-12-11 DIAGNOSIS — Z87891 Personal history of nicotine dependence: Secondary | ICD-10-CM | POA: Diagnosis not present

## 2021-12-11 DIAGNOSIS — Z720 Tobacco use: Secondary | ICD-10-CM | POA: Diagnosis not present

## 2021-12-16 ENCOUNTER — Other Ambulatory Visit (HOSPITAL_COMMUNITY): Payer: Self-pay | Admitting: Family Medicine

## 2021-12-16 DIAGNOSIS — N281 Cyst of kidney, acquired: Secondary | ICD-10-CM

## 2021-12-17 ENCOUNTER — Other Ambulatory Visit (HOSPITAL_COMMUNITY): Payer: Self-pay

## 2021-12-23 ENCOUNTER — Other Ambulatory Visit (HOSPITAL_COMMUNITY): Payer: Self-pay

## 2021-12-30 ENCOUNTER — Other Ambulatory Visit (HOSPITAL_COMMUNITY): Payer: Self-pay

## 2022-01-02 ENCOUNTER — Ambulatory Visit (HOSPITAL_COMMUNITY)
Admission: RE | Admit: 2022-01-02 | Discharge: 2022-01-02 | Disposition: A | Discharge status: Home / Self Care | Payer: 59 | Source: Ambulatory Visit | Attending: Family Medicine | Admitting: Family Medicine

## 2022-01-02 DIAGNOSIS — N281 Cyst of kidney, acquired: Secondary | ICD-10-CM | POA: Insufficient documentation

## 2022-01-02 DIAGNOSIS — K828 Other specified diseases of gallbladder: Secondary | ICD-10-CM | POA: Diagnosis not present

## 2022-01-02 MED ORDER — GADOPICLENOL 0.5 MMOL/ML IV SOLN
9.0000 mL | Freq: Once | INTRAVENOUS | Status: AC | PRN
  Administered 2022-01-02: 9 mL via INTRAVENOUS

## 2022-01-06 ENCOUNTER — Encounter: Payer: Self-pay | Admitting: Neurology

## 2022-01-06 ENCOUNTER — Other Ambulatory Visit (HOSPITAL_COMMUNITY): Payer: Self-pay

## 2022-01-06 ENCOUNTER — Ambulatory Visit: Payer: 59 | Admitting: Neurology

## 2022-01-06 VITALS — BP 114/62 | HR 76 | Ht 66.0 in | Wt 197.8 lb

## 2022-01-06 DIAGNOSIS — R208 Other disturbances of skin sensation: Secondary | ICD-10-CM

## 2022-01-06 DIAGNOSIS — R200 Anesthesia of skin: Secondary | ICD-10-CM | POA: Diagnosis not present

## 2022-01-06 DIAGNOSIS — G35 Multiple sclerosis: Secondary | ICD-10-CM | POA: Diagnosis not present

## 2022-01-06 DIAGNOSIS — Z79899 Other long term (current) drug therapy: Secondary | ICD-10-CM | POA: Diagnosis not present

## 2022-01-06 DIAGNOSIS — R269 Unspecified abnormalities of gait and mobility: Secondary | ICD-10-CM

## 2022-01-06 MED ORDER — MELOXICAM 7.5 MG PO TABS
7.5000 mg | ORAL_TABLET | Freq: Every day | ORAL | 5 refills | Status: DC
  Filled 2022-01-06: qty 30, 30d supply, fill #0
  Filled 2022-02-02: qty 30, 30d supply, fill #1
  Filled 2022-03-03: qty 30, 30d supply, fill #2
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
## 2022-01-07 ENCOUNTER — Telehealth: Payer: Self-pay | Admitting: Neurology

## 2022-01-07 NOTE — Telephone Encounter (Signed)
UMR NPR sent to GI per patient request

## 2022-01-18 ENCOUNTER — Ambulatory Visit
Admission: RE | Admit: 2022-01-18 | Discharge: 2022-01-18 | Disposition: A | Discharge status: Home / Self Care | Payer: 59 | Source: Ambulatory Visit | Attending: Neurology | Admitting: Neurology

## 2022-01-18 DIAGNOSIS — G35 Multiple sclerosis: Secondary | ICD-10-CM | POA: Diagnosis not present

## 2022-01-18 DIAGNOSIS — R269 Unspecified abnormalities of gait and mobility: Secondary | ICD-10-CM

## 2022-01-18 DIAGNOSIS — R200 Anesthesia of skin: Secondary | ICD-10-CM

## 2022-01-18 DIAGNOSIS — R519 Headache, unspecified: Secondary | ICD-10-CM | POA: Diagnosis not present

## 2022-01-20 ENCOUNTER — Telehealth: Payer: Self-pay | Admitting: Neurology

## 2022-01-20 ENCOUNTER — Other Ambulatory Visit (HOSPITAL_COMMUNITY): Payer: Self-pay

## 2022-01-20 NOTE — Telephone Encounter (Signed)
Called and spoke w/ pt. Advised we checked MRI brain/cervical and this would not show her shoulder. Recommended she f/u with PCP for further evaluation. May send her to Ortho as well.   She wants copy of last OV notes. Aware I will send to Hilda Blades in medical records to follow up with her on this request.

## 2022-01-20 NOTE — Telephone Encounter (Signed)
Pt asking if MRI showed anything about her shoulder. Having pain in shoulder, under shoulder blade. Would like a call from the nurse

## 2022-01-21 NOTE — Telephone Encounter (Signed)
Medical records team has placed last office note in the mail for the patient

## 2022-01-29 ENCOUNTER — Other Ambulatory Visit (HOSPITAL_COMMUNITY): Payer: Self-pay

## 2022-02-02 ENCOUNTER — Other Ambulatory Visit (HOSPITAL_COMMUNITY): Payer: Self-pay

## 2022-02-17 ENCOUNTER — Other Ambulatory Visit (HOSPITAL_COMMUNITY): Payer: Self-pay

## 2022-02-17 ENCOUNTER — Ambulatory Visit: Payer: 59 | Attending: Internal Medicine | Admitting: Pharmacist

## 2022-02-17 ENCOUNTER — Other Ambulatory Visit: Payer: Self-pay | Admitting: Neurology

## 2022-02-17 DIAGNOSIS — Z79899 Other long term (current) drug therapy: Secondary | ICD-10-CM

## 2022-02-17 MED ORDER — DIMETHYL FUMARATE 240 MG PO CPDR
1.0000 | DELAYED_RELEASE_CAPSULE | Freq: Two times a day (BID) | ORAL | 3 refills | Status: DC
  Filled ????-??-??: qty 180, 90d supply, fill #0

## 2022-02-17 MED ORDER — VALSARTAN-HYDROCHLOROTHIAZIDE 80-12.5 MG PO TABS
1.0000 | ORAL_TABLET | Freq: Every day | ORAL | 1 refills | Status: DC
  Filled 2022-02-17 – 2022-03-06 (×2): qty 90, 90d supply, fill #0
  Filled 2022-06-16: qty 90, 90d supply, fill #1

## 2022-02-17 MED ORDER — DIMETHYL FUMARATE 240 MG PO CPDR
1.0000 | DELAYED_RELEASE_CAPSULE | Freq: Two times a day (BID) | ORAL | 3 refills | Status: DC
  Filled 2022-02-17: qty 60, 30d supply, fill #0
  Filled 2022-03-17 – 2022-03-20 (×2): qty 60, 30d supply, fill #1
  Filled 2022-04-16: qty 60, 30d supply, fill #2

## 2022-02-17 NOTE — Progress Notes (Signed)
   S: Patient presents today for review of their specialty medication.   Patient is currently taking Tecfidera (dimethyl fumarate) for MS. Patient is managed by Dr. Arlice Colt for this.   Adherence: confirms  Efficacy: pt is happy with her results.  Dosing: 240 mg BID  Renal adjustment: no adjustment necessary  Hepatic adjustment: no adjustment necessary  Dose adjustment for toxicity:  Flushing, GI intolerance, or intolerance to maintenance dose: Consider temporary dose reduction to 120 mg twice daily (resume recommended maintenance dose of 240 mg twice daily within 4 weeks). Consider discontinuation in patients who cannot tolerate return to the maintenance dose.  Hepatic injury (suspected drug-induced), clinically significant: Discontinue treatment.  Lymphocyte count <500/mm3 persisting for >6 months: Consider treatment interruption.  Serious infection: Consider withholding treatment until infection resolves.  Current adverse effects: Flushing, skin rash, or puritus: none S/sx of infection: none  GI upset: none  S/sx of heptotoxicity: none; per pt, has blood work done q80month. No LFT abnormalities on scanned report from 11/04/2021. Lymphopenia: none per pt PML/proteinuria: none   O: Labs followed q366monthby Dr. SaFelecia ShellingWNL per scanned report from 11/04/2021.   Lab Results  Component Value Date   WBC 10.1 05/29/2018   HGB 14.0 05/29/2018   HCT 42.1 05/29/2018   MCV 98.1 05/29/2018   PLT 302 05/29/2018      Chemistry      Component Value Date/Time   NA 137 05/30/2018 0550   K 4.0 05/30/2018 0550   CL 105 05/30/2018 0550   CO2 22 05/30/2018 0550   BUN 23 05/30/2018 0550   CREATININE 0.72 05/30/2018 0550      Component Value Date/Time   CALCIUM 9.2 05/30/2018 0550   ALKPHOS 73 06/23/2018 1501   AST 15 06/23/2018 1501   ALT 15 06/23/2018 1501   BILITOT 0.5 06/23/2018 1501     A/P: 1. Medication review: Patient is currently on Tecfidera for MS and is  tolerating it well. Reviewed the medication with the patient, including the following: dimethyl fumarate activates the Nrf2 pathway, which is believed to result in anti-inflammatory and cytoprotective properties. The medication is oral and should be swallowed whole. Administering with a high-fat, high-protein meal may decrease flushing and GI side effects. Possible adverse effects include skin flushing, pruritus, GI upset, albuminura, infection, lymphocytopenia, and increased liver transaminases. Cases of PML have been reported with severe, long-standing lymphopenia identified as the primary risk for PML. Dose adjustments for toxicities have been summarized above. No recommendations for any changes at this time.   LuBenard HalstedPharmD, BCPara [phone]

## 2022-02-24 ENCOUNTER — Other Ambulatory Visit (HOSPITAL_COMMUNITY): Payer: Self-pay

## 2022-03-03 ENCOUNTER — Other Ambulatory Visit (HOSPITAL_COMMUNITY): Payer: Self-pay

## 2022-03-04 ENCOUNTER — Other Ambulatory Visit (HOSPITAL_COMMUNITY): Payer: Self-pay

## 2022-03-04 MED ORDER — ROSUVASTATIN CALCIUM 10 MG PO TABS
10.0000 mg | ORAL_TABLET | Freq: Every day | ORAL | 1 refills | Status: DC
  Filled 2022-03-04 – 2022-04-30 (×2): qty 90, 90d supply, fill #0
  Filled 2022-08-02: qty 90, 90d supply, fill #1

## 2022-03-06 ENCOUNTER — Other Ambulatory Visit (HOSPITAL_COMMUNITY): Payer: Self-pay

## 2022-03-17 ENCOUNTER — Other Ambulatory Visit (HOSPITAL_COMMUNITY): Payer: Self-pay

## 2022-03-20 ENCOUNTER — Other Ambulatory Visit (HOSPITAL_COMMUNITY): Payer: Self-pay

## 2022-03-23 ENCOUNTER — Other Ambulatory Visit: Payer: Self-pay

## 2022-04-07 ENCOUNTER — Other Ambulatory Visit (HOSPITAL_COMMUNITY): Payer: Self-pay

## 2022-04-14 ENCOUNTER — Other Ambulatory Visit (HOSPITAL_COMMUNITY): Payer: Self-pay | Admitting: Family Medicine

## 2022-04-14 ENCOUNTER — Other Ambulatory Visit (HOSPITAL_COMMUNITY): Payer: Self-pay

## 2022-04-14 DIAGNOSIS — Z1231 Encounter for screening mammogram for malignant neoplasm of breast: Secondary | ICD-10-CM

## 2022-04-16 ENCOUNTER — Other Ambulatory Visit (HOSPITAL_COMMUNITY): Payer: Self-pay

## 2022-04-20 ENCOUNTER — Telehealth: Payer: Self-pay | Admitting: Neurology

## 2022-04-20 NOTE — Telephone Encounter (Signed)
Pt is asking for a call to discuss the tier exception on her Dimethyl Fumarate 240 MG CPDR ,

## 2022-04-21 NOTE — Telephone Encounter (Signed)
I called patient. She has changed insurances to [employer]. She will fax [place] new insurance card info and we will complete new PA for dimethyl fumarate. She is unsure if she can scan the cards to mychart.

## 2022-04-21 NOTE — Telephone Encounter (Addendum)
I received patient's insurance card. I attempted PA for dimethyl fumarate on CMM. Received this notice from [employer]: "Patient not eligible (does not have coverage with the payer)" Key: [id number].  I called patient. Her insurance doesn't take effect until 05/14/22. she has enough DF to last her until then. She will call us on 05/14/22 to remind [place] to do the PA for DF once her insurance is effective.  [employer] RX BIN: [id number] RX PCN: [id number] RX GRP: [id number] Member ID: [id number]  Also sent to media for scanning.

## 2022-04-22 ENCOUNTER — Other Ambulatory Visit (HOSPITAL_COMMUNITY): Payer: Self-pay

## 2022-04-22 ENCOUNTER — Other Ambulatory Visit: Payer: Self-pay

## 2022-04-23 ENCOUNTER — Other Ambulatory Visit (HOSPITAL_COMMUNITY): Payer: Self-pay

## 2022-04-23 ENCOUNTER — Other Ambulatory Visit: Payer: Self-pay

## 2022-04-24 ENCOUNTER — Other Ambulatory Visit: Payer: Self-pay

## 2022-04-24 ENCOUNTER — Other Ambulatory Visit (HOSPITAL_COMMUNITY): Payer: Self-pay

## 2022-04-27 ENCOUNTER — Other Ambulatory Visit: Payer: Self-pay

## 2022-04-29 ENCOUNTER — Ambulatory Visit (HOSPITAL_COMMUNITY)
Admission: RE | Admit: 2022-04-29 | Discharge: 2022-04-29 | Disposition: A | Discharge status: Home / Self Care | Payer: Commercial Managed Care - PPO | Source: Ambulatory Visit | Attending: Family Medicine | Admitting: Family Medicine

## 2022-04-29 DIAGNOSIS — Z1231 Encounter for screening mammogram for malignant neoplasm of breast: Secondary | ICD-10-CM | POA: Diagnosis not present

## 2022-04-30 ENCOUNTER — Other Ambulatory Visit (HOSPITAL_COMMUNITY): Payer: Self-pay

## 2022-05-08 DIAGNOSIS — E782 Mixed hyperlipidemia: Secondary | ICD-10-CM | POA: Diagnosis not present

## 2022-05-08 DIAGNOSIS — R7303 Prediabetes: Secondary | ICD-10-CM | POA: Diagnosis not present

## 2022-05-08 DIAGNOSIS — I10 Essential (primary) hypertension: Secondary | ICD-10-CM | POA: Diagnosis not present

## 2022-05-13 ENCOUNTER — Other Ambulatory Visit (HOSPITAL_COMMUNITY): Payer: Self-pay

## 2022-05-13 MED ORDER — PREDNISONE 10 MG (21) PO TBPK
ORAL_TABLET | ORAL | 0 refills | Status: DC
  Filled 2022-05-13 – 2022-05-15 (×2): qty 21, 6d supply, fill #0

## 2022-05-13 MED ORDER — CLOTRIMAZOLE-BETAMETHASONE 1-0.05 % EX CREA
TOPICAL_CREAM | Freq: Two times a day (BID) | CUTANEOUS | 0 refills | Status: DC
  Filled 2022-05-13: qty 45, 14d supply, fill #0
  Filled 2022-05-15: qty 45, 15d supply, fill #0

## 2022-05-14 ENCOUNTER — Other Ambulatory Visit: Payer: Self-pay

## 2022-05-14 ENCOUNTER — Telehealth: Payer: Self-pay | Admitting: Neurology

## 2022-05-14 ENCOUNTER — Other Ambulatory Visit (HOSPITAL_COMMUNITY): Payer: Self-pay

## 2022-05-14 ENCOUNTER — Other Ambulatory Visit: Payer: Self-pay | Admitting: Neurology

## 2022-05-14 MED ORDER — DIMETHYL FUMARATE 240 MG PO CPDR
1.0000 | DELAYED_RELEASE_CAPSULE | Freq: Two times a day (BID) | ORAL | 3 refills | Status: DC
  Filled 2022-05-14 – 2022-05-15 (×2): qty 180, 90d supply, fill #0
  Filled 2022-05-20 – 2022-05-22 (×3): qty 60, 30d supply, fill #0
  Filled 2022-06-16: qty 60, 30d supply, fill #1
  Filled 2022-07-23: qty 60, 30d supply, fill #2
  Filled 2022-08-27: qty 60, 30d supply, fill #3
  Filled 2022-09-23: qty 60, 30d supply, fill #4
  Filled 2022-10-21: qty 60, 30d supply, fill #5
  Filled 2022-11-20: qty 60, 30d supply, fill #6
  Filled 2022-12-22: qty 60, 30d supply, fill #7
  Filled 2023-01-11: qty 60, 30d supply, fill #8

## 2022-05-14 NOTE — Telephone Encounter (Signed)
Can you help with PA for Dimethyl Fumarate 240 MG CPDR. Patient has new insurance.

## 2022-05-14 NOTE — Telephone Encounter (Signed)
Pharmacist Emeterio Reeve @ [employer]  states pt is asking for a PA on medication Dimethyl Fumarate 240 MG CPDR.  Clinical information is needed. # to call is [place] also said clinical notes could also just be faxed over to [phone]

## 2022-05-15 ENCOUNTER — Other Ambulatory Visit (HOSPITAL_COMMUNITY): Payer: Self-pay

## 2022-05-15 ENCOUNTER — Other Ambulatory Visit: Payer: Self-pay

## 2022-05-15 NOTE — Telephone Encounter (Signed)
I tried to submit PA via CMM with [employer] but it stated to call insurance-I called [employer] and a previous PA was initiated and denied on 05/14/22 and that the patient had initiated an appeal but they needed some medical/clinical questions answered. I answered the PA questions but then there was a tier exception questions-Insurance will fax me the forms for the missing information needed and I will let you know what info is needed to complete. The PA reference number is [id number]

## 2022-05-18 NOTE — Telephone Encounter (Signed)
Error

## 2022-05-19 NOTE — Telephone Encounter (Signed)
Received medical records request from [facility] [employer] for office notes and labs pertaining to diagnosis for the medication requested: Dimethyl Fumarate. Sent last 4 office notes and most recent labs.

## 2022-05-20 ENCOUNTER — Other Ambulatory Visit (HOSPITAL_COMMUNITY): Payer: Self-pay

## 2022-05-22 ENCOUNTER — Other Ambulatory Visit (HOSPITAL_COMMUNITY): Payer: Self-pay

## 2022-05-22 ENCOUNTER — Other Ambulatory Visit: Payer: Self-pay

## 2022-05-25 ENCOUNTER — Other Ambulatory Visit (HOSPITAL_COMMUNITY): Payer: Self-pay

## 2022-05-25 NOTE — Telephone Encounter (Addendum)
Received fax from [employer] that tier exception request denied for dimethyl fumarate. Medication available at the lowest available tier for its drug class.   Called and spoke with Dominica at [facility]. States they filled rx today. Copay 100.00. Shipped out to pt. She should receive medication tomorrow. They believe she has a deductible to meet d/t being new yr before cost will lower. Previous fill was 33.00.  I called pt at [phone]. Relayed above message. Pt verbalized understanding. She will f/u with insurance to see if she has deductible to meet.

## 2022-06-16 ENCOUNTER — Other Ambulatory Visit (HOSPITAL_COMMUNITY): Payer: Self-pay

## 2022-06-30 ENCOUNTER — Other Ambulatory Visit: Payer: Self-pay

## 2022-07-02 ENCOUNTER — Other Ambulatory Visit (HOSPITAL_COMMUNITY): Payer: Self-pay

## 2022-07-08 ENCOUNTER — Ambulatory Visit: Payer: PPO | Admitting: Neurology

## 2022-07-08 ENCOUNTER — Encounter: Payer: Self-pay | Admitting: Neurology

## 2022-07-08 VITALS — BP 112/73 | HR 75 | Ht 66.0 in | Wt 204.2 lb

## 2022-07-08 DIAGNOSIS — G35 Multiple sclerosis: Secondary | ICD-10-CM | POA: Diagnosis not present

## 2022-07-08 DIAGNOSIS — Z79899 Other long term (current) drug therapy: Secondary | ICD-10-CM

## 2022-07-08 DIAGNOSIS — R269 Unspecified abnormalities of gait and mobility: Secondary | ICD-10-CM | POA: Diagnosis not present

## 2022-07-08 DIAGNOSIS — R208 Other disturbances of skin sensation: Secondary | ICD-10-CM | POA: Diagnosis not present

## 2022-07-08 DIAGNOSIS — R200 Anesthesia of skin: Secondary | ICD-10-CM
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
## 2022-07-09 ENCOUNTER — Other Ambulatory Visit (HOSPITAL_COMMUNITY): Payer: Self-pay

## 2022-07-23 ENCOUNTER — Other Ambulatory Visit (HOSPITAL_COMMUNITY): Payer: Self-pay

## 2022-08-03 ENCOUNTER — Other Ambulatory Visit: Payer: Self-pay

## 2022-08-04 ENCOUNTER — Other Ambulatory Visit (HOSPITAL_COMMUNITY): Payer: Self-pay

## 2022-08-12 ENCOUNTER — Other Ambulatory Visit: Payer: Self-pay

## 2022-08-12 ENCOUNTER — Other Ambulatory Visit (HOSPITAL_COMMUNITY): Payer: Self-pay

## 2022-08-12 MED ORDER — CYCLOSPORINE 0.05 % OP EMUL
1.0000 [drp] | Freq: Two times a day (BID) | OPHTHALMIC | 99 refills | Status: DC
  Filled 2022-08-12: qty 180, 90d supply, fill #0

## 2022-08-27 ENCOUNTER — Other Ambulatory Visit (HOSPITAL_COMMUNITY): Payer: Self-pay

## 2022-08-28 ENCOUNTER — Other Ambulatory Visit: Payer: Self-pay

## 2022-09-22 ENCOUNTER — Other Ambulatory Visit (HOSPITAL_COMMUNITY): Payer: Self-pay

## 2022-09-23 ENCOUNTER — Other Ambulatory Visit (HOSPITAL_COMMUNITY): Payer: Self-pay

## 2022-09-23 ENCOUNTER — Other Ambulatory Visit: Payer: Self-pay

## 2022-09-23 MED ORDER — VALSARTAN-HYDROCHLOROTHIAZIDE 80-12.5 MG PO TABS
1.0000 | ORAL_TABLET | Freq: Every day | ORAL | 1 refills | Status: DC
  Filled 2022-09-23: qty 90, 90d supply, fill #0
  Filled 2022-12-19: qty 90, 90d supply, fill #1

## 2022-09-28 ENCOUNTER — Other Ambulatory Visit (HOSPITAL_COMMUNITY): Payer: Self-pay

## 2022-09-29 ENCOUNTER — Other Ambulatory Visit: Payer: Self-pay

## 2022-10-21 ENCOUNTER — Other Ambulatory Visit (HOSPITAL_COMMUNITY): Payer: Self-pay

## 2022-10-26 ENCOUNTER — Other Ambulatory Visit (HOSPITAL_COMMUNITY): Payer: Self-pay

## 2022-11-13 ENCOUNTER — Other Ambulatory Visit (HOSPITAL_COMMUNITY): Payer: Self-pay

## 2022-11-16 ENCOUNTER — Other Ambulatory Visit (HOSPITAL_COMMUNITY): Payer: Self-pay

## 2022-11-16 MED ORDER — ROSUVASTATIN CALCIUM 10 MG PO TABS
10.0000 mg | ORAL_TABLET | Freq: Every day | ORAL | 1 refills | Status: DC
  Filled 2022-11-16: qty 90, 90d supply, fill #0
  Filled 2023-02-08: qty 90, 90d supply, fill #1

## 2022-11-18 DIAGNOSIS — R7303 Prediabetes: Secondary | ICD-10-CM | POA: Diagnosis not present

## 2022-11-18 DIAGNOSIS — E782 Mixed hyperlipidemia: Secondary | ICD-10-CM | POA: Diagnosis not present

## 2022-11-18 DIAGNOSIS — E559 Vitamin D deficiency, unspecified: Secondary | ICD-10-CM | POA: Diagnosis not present

## 2022-11-18 DIAGNOSIS — I10 Essential (primary) hypertension: Secondary | ICD-10-CM | POA: Diagnosis not present

## 2022-11-19 LAB — LAB REPORT - SCANNED
A1c: 6
EGFR: 100

## 2022-11-20 ENCOUNTER — Other Ambulatory Visit (HOSPITAL_COMMUNITY): Payer: Self-pay

## 2022-11-23 ENCOUNTER — Other Ambulatory Visit (HOSPITAL_COMMUNITY): Payer: Self-pay

## 2022-12-03 ENCOUNTER — Other Ambulatory Visit: Payer: Self-pay

## 2022-12-03 ENCOUNTER — Other Ambulatory Visit (HOSPITAL_COMMUNITY): Payer: Self-pay

## 2022-12-03 DIAGNOSIS — I700 Atherosclerosis of aorta: Secondary | ICD-10-CM | POA: Diagnosis not present

## 2022-12-03 DIAGNOSIS — M19042 Primary osteoarthritis, left hand: Secondary | ICD-10-CM | POA: Diagnosis not present

## 2022-12-03 DIAGNOSIS — R7303 Prediabetes: Secondary | ICD-10-CM | POA: Diagnosis not present

## 2022-12-03 DIAGNOSIS — I10 Essential (primary) hypertension: Secondary | ICD-10-CM | POA: Diagnosis not present

## 2022-12-03 DIAGNOSIS — D531 Other megaloblastic anemias, not elsewhere classified: Secondary | ICD-10-CM | POA: Diagnosis not present

## 2022-12-03 DIAGNOSIS — Z0001 Encounter for general adult medical examination with abnormal findings: Secondary | ICD-10-CM | POA: Diagnosis not present

## 2022-12-03 DIAGNOSIS — G35 Multiple sclerosis: Secondary | ICD-10-CM | POA: Diagnosis not present

## 2022-12-03 DIAGNOSIS — F411 Generalized anxiety disorder: Secondary | ICD-10-CM | POA: Diagnosis not present

## 2022-12-03 DIAGNOSIS — M19041 Primary osteoarthritis, right hand: Secondary | ICD-10-CM | POA: Diagnosis not present

## 2022-12-03 DIAGNOSIS — E559 Vitamin D deficiency, unspecified: Secondary | ICD-10-CM | POA: Diagnosis not present

## 2022-12-03 DIAGNOSIS — Z87891 Personal history of nicotine dependence: Secondary | ICD-10-CM | POA: Diagnosis not present

## 2022-12-03 DIAGNOSIS — E782 Mixed hyperlipidemia: Secondary | ICD-10-CM | POA: Diagnosis not present

## 2022-12-03 MED ORDER — PREDNISONE 10 MG (21) PO TBPK
10.0000 mg | ORAL_TABLET | Freq: Every day | ORAL | 0 refills | Status: AC
  Filled 2022-12-03 (×2): qty 21, 6d supply, fill #0

## 2022-12-09 ENCOUNTER — Other Ambulatory Visit (HOSPITAL_COMMUNITY): Payer: Self-pay | Admitting: Internal Medicine

## 2022-12-09 DIAGNOSIS — Z87891 Personal history of nicotine dependence: Secondary | ICD-10-CM

## 2022-12-11 ENCOUNTER — Other Ambulatory Visit (HOSPITAL_COMMUNITY): Payer: Self-pay | Admitting: Internal Medicine

## 2022-12-11 DIAGNOSIS — Z13820 Encounter for screening for osteoporosis: Secondary | ICD-10-CM

## 2022-12-17 ENCOUNTER — Encounter (HOSPITAL_COMMUNITY): Payer: Self-pay

## 2022-12-17 ENCOUNTER — Other Ambulatory Visit (HOSPITAL_COMMUNITY): Payer: Self-pay

## 2022-12-19 ENCOUNTER — Other Ambulatory Visit (HOSPITAL_COMMUNITY): Payer: Self-pay

## 2022-12-22 ENCOUNTER — Other Ambulatory Visit (HOSPITAL_COMMUNITY): Payer: Self-pay

## 2022-12-22 ENCOUNTER — Other Ambulatory Visit: Payer: Self-pay

## 2022-12-23 ENCOUNTER — Ambulatory Visit (HOSPITAL_COMMUNITY)
Admission: RE | Admit: 2022-12-23 | Discharge: 2022-12-23 | Disposition: A | Discharge status: Home / Self Care | Payer: PPO | Source: Ambulatory Visit | Attending: Internal Medicine | Admitting: Internal Medicine

## 2022-12-23 DIAGNOSIS — Z13820 Encounter for screening for osteoporosis: Secondary | ICD-10-CM | POA: Insufficient documentation

## 2022-12-23 DIAGNOSIS — M85851 Other specified disorders of bone density and structure, right thigh: Secondary | ICD-10-CM | POA: Diagnosis not present

## 2022-12-23 DIAGNOSIS — Z780 Asymptomatic menopausal state: Secondary | ICD-10-CM | POA: Diagnosis not present

## 2022-12-24 ENCOUNTER — Other Ambulatory Visit (HOSPITAL_COMMUNITY): Payer: Self-pay

## 2022-12-24 ENCOUNTER — Other Ambulatory Visit: Payer: Self-pay

## 2022-12-31 ENCOUNTER — Ambulatory Visit (HOSPITAL_COMMUNITY)
Admission: RE | Admit: 2022-12-31 | Discharge: 2022-12-31 | Disposition: A | Discharge status: Home / Self Care | Payer: PPO | Source: Ambulatory Visit | Attending: Internal Medicine | Admitting: Internal Medicine

## 2022-12-31 DIAGNOSIS — Z87891 Personal history of nicotine dependence: Secondary | ICD-10-CM | POA: Diagnosis not present

## 2023-01-11 ENCOUNTER — Other Ambulatory Visit: Payer: Self-pay

## 2023-01-11 ENCOUNTER — Other Ambulatory Visit (HOSPITAL_COMMUNITY): Payer: Self-pay

## 2023-01-11 NOTE — Progress Notes (Signed)
Specialty Pharmacy Refill Coordination Note  Joan [name] is a 67 y.o. female contacted today regarding refills of specialty medication(s) Dimethyl Fumarate (Multiple Sclerosis Agents) .  Patient requested Delivery  on 01/19/23  to verified address [address], [place] [address]   Medication will be filled on 10/7.   Patient requests a call before we initiate fill to discuss copayment on 10/7. She may want to consider one of the generics with a copay card or contact doctor Sater about alternative.

## 2023-01-14 ENCOUNTER — Other Ambulatory Visit: Payer: Self-pay

## 2023-01-14 ENCOUNTER — Telehealth: Payer: Self-pay | Admitting: Neurology

## 2023-01-14 MED ORDER — DIMETHYL FUMARATE 240 MG PO CPDR: 1.0000 | DELAYED_RELEASE_CAPSULE | Freq: Two times a day (BID) | ORAL | 1 refills | Status: DC

## 2023-01-14 NOTE — Telephone Encounter (Signed)
Pt called back and I explained the below to her, pt said she will try [facility] dimethyl fumarate. I explained that she will need to create an account. Pt said she will do this on Monday 01/18/23 she will need help with from her husband to do this. She asked I go ahead and send in Rx.  Pt asked I send her the website informed via my chart.
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
## 2023-01-14 NOTE — Telephone Encounter (Addendum)
Pt can get dimethyl fumarate via [facility] for about 108.00 for 90 days supply or 39.50 for a 30 days supply. It is a little cheaper for her to go with 90 days supply option. She will just need to go to [email or web address] to create a profile first. We send in a prescription electronically on our end and then they will set up shipment with her.  Dalfampridine is not an alternate option. This is not a disease modifying therapy like dimethyl fumarate. This helps improve walking in MS patient's.   If cost of dimethyl is still too much via [facility], we can try and move up her f/u with Dr. Epimenio Foot to discuss other options.  LVM asking for pt to call

## 2023-01-15 ENCOUNTER — Other Ambulatory Visit (HOSPITAL_COMMUNITY): Payer: Self-pay

## 2023-01-15 NOTE — Progress Notes (Signed)
Patient called back about medication Dimethyl Fumarate and for now medication has been D/c from [facility] due to price and possible will start back in January maybe depending on cost.  I have disenrolled patient for now.

## 2023-01-21 DIAGNOSIS — Z23 Encounter for immunization: Secondary | ICD-10-CM | POA: Diagnosis not present

## 2023-02-02 ENCOUNTER — Ambulatory Visit: Payer: PPO | Admitting: Family Medicine

## 2023-02-02 ENCOUNTER — Encounter: Payer: Self-pay | Admitting: Family Medicine

## 2023-02-02 VITALS — BP 129/80 | HR 69 | Ht 66.0 in | Wt 206.5 lb

## 2023-02-02 DIAGNOSIS — R208 Other disturbances of skin sensation: Secondary | ICD-10-CM | POA: Diagnosis not present

## 2023-02-02 DIAGNOSIS — Z79899 Other long term (current) drug therapy: Secondary | ICD-10-CM | POA: Diagnosis not present

## 2023-02-02 DIAGNOSIS — G35 Multiple sclerosis: Secondary | ICD-10-CM
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
## 2023-02-02 NOTE — Patient Instructions (Signed)
Below is our plan:  We will continue dimethyl fumerate. You can try valerian root over the counter. Another option is ashwaghanda over the counter. You can ask your pharmacist about these as well.   Please make sure you are staying well hydrated. I recommend 50-60 ounces daily. Well balanced diet and regular exercise encouraged. Consistent sleep schedule with 6-8 hours recommended.   Please continue follow up with care team as directed.   Follow up with Dr Epimenio Foot in 6 months   You may receive a survey regarding today's visit. I encourage you to leave honest feed back as I do use this information to improve patient care. Thank you for seeing me today!

## 2023-02-08 ENCOUNTER — Other Ambulatory Visit (HOSPITAL_COMMUNITY): Payer: Self-pay

## 2023-03-05 ENCOUNTER — Other Ambulatory Visit (HOSPITAL_COMMUNITY): Payer: Self-pay | Admitting: Family Medicine

## 2023-03-05 ENCOUNTER — Ambulatory Visit (HOSPITAL_COMMUNITY)
Admission: RE | Admit: 2023-03-05 | Discharge: 2023-03-05 | Disposition: A | Discharge status: Home / Self Care | Payer: PPO | Source: Ambulatory Visit | Attending: Family Medicine | Admitting: Family Medicine

## 2023-03-05 DIAGNOSIS — R9389 Abnormal findings on diagnostic imaging of other specified body structures: Secondary | ICD-10-CM

## 2023-03-05 DIAGNOSIS — R310 Gross hematuria: Secondary | ICD-10-CM | POA: Diagnosis not present

## 2023-03-05 DIAGNOSIS — N281 Cyst of kidney, acquired: Secondary | ICD-10-CM | POA: Diagnosis not present

## 2023-03-05 MED ORDER — IOHEXOL 300 MG/ML  SOLN
125.0000 mL | Freq: Once | INTRAMUSCULAR | Status: AC | PRN
  Administered 2023-03-05: 125 mL via INTRAVENOUS

## 2023-03-16 ENCOUNTER — Other Ambulatory Visit (HOSPITAL_COMMUNITY): Payer: Self-pay | Admitting: Internal Medicine

## 2023-03-16 DIAGNOSIS — Z1231 Encounter for screening mammogram for malignant neoplasm of breast: Secondary | ICD-10-CM

## 2023-03-17 ENCOUNTER — Ambulatory Visit (HOSPITAL_COMMUNITY)
Admission: RE | Admit: 2023-03-17 | Discharge: 2023-03-17 | Disposition: A | Discharge status: Home / Self Care | Payer: PPO | Source: Ambulatory Visit | Attending: Family Medicine | Admitting: Family Medicine

## 2023-03-17 DIAGNOSIS — N854 Malposition of uterus: Secondary | ICD-10-CM | POA: Diagnosis not present

## 2023-03-17 DIAGNOSIS — R9389 Abnormal findings on diagnostic imaging of other specified body structures: Secondary | ICD-10-CM | POA: Diagnosis not present

## 2023-03-18 ENCOUNTER — Other Ambulatory Visit: Payer: Self-pay

## 2023-03-18 ENCOUNTER — Other Ambulatory Visit (HOSPITAL_COMMUNITY): Payer: Self-pay

## 2023-03-18 MED ORDER — VALSARTAN-HYDROCHLOROTHIAZIDE 80-12.5 MG PO TABS
1.0000 | ORAL_TABLET | Freq: Every day | ORAL | 0 refills | Status: DC
  Filled 2023-03-18: qty 90, 90d supply, fill #0

## 2023-04-01 ENCOUNTER — Other Ambulatory Visit: Payer: Self-pay | Admitting: Pathology

## 2023-04-01 DIAGNOSIS — N950 Postmenopausal bleeding: Secondary | ICD-10-CM | POA: Diagnosis not present

## 2023-04-09 ENCOUNTER — Telehealth: Payer: Self-pay | Admitting: *Deleted

## 2023-04-09 ENCOUNTER — Encounter: Payer: Self-pay | Admitting: Gynecologic Oncology

## 2023-04-09 NOTE — Telephone Encounter (Signed)
Spoke with Joan [name] regarding her referral to GYN oncology. She has an appointment scheduled with Dr. Pricilla Holm on Friday, January 3 rd 2025 at 0945. Patient agrees to date and time. She has been provided with office address and location. She is also aware of our mask and visitor policy. Patient verbalized understanding and will call with any questions.

## 2023-04-15 ENCOUNTER — Encounter: Payer: Self-pay | Admitting: Gynecologic Oncology

## 2023-04-15 DIAGNOSIS — C541 Malignant neoplasm of endometrium: Secondary | ICD-10-CM | POA: Insufficient documentation

## 2023-04-15 DIAGNOSIS — C539 Malignant neoplasm of cervix uteri, unspecified: Secondary | ICD-10-CM | POA: Insufficient documentation

## 2023-04-15 NOTE — Progress Notes (Signed)
 GYNECOLOGIC ONCOLOGY NEW PATIENT CONSULTATION   Patient Name: Joan [name]  Patient Age: 68 y.o. Date of Service: 04/16/23 Referring Provider: Dr. Burnard Bowers  Primary Care Provider: Shona Norleen PEDLAR, MD Consulting Provider: Comer Dollar, MD   Assessment/Plan:  Postmenopausal patient with clinical stage I high-grade endometrial adenocarcinoma.  We reviewed the nature of endometrial cancer and its recommended surgical staging, including total hysterectomy, bilateral salpingo-oophorectomy, and lymph node assessment. The patient is a suitable candidate for staging via a minimally invasive approach to surgery.  We reviewed that robotic assistance would be used to complete the surgery.   We discussed that most endometrial cancer is detected early, however, we reviewed that adjuvant therapy will likely be recommended based on the patient's biopsy, however, we will defer to final pathology results.    CT scan performed recently was negative for metastatic disease.  We reviewed the sentinel lymph node technique. Risks and benefits of sentinel lymph node biopsy was reviewed. We reviewed the technique and ICG dye. The patient DOES NOT have an iodine allergy or known liver dysfunction. We reviewed the false negative rate (0.4%), and that 3% of patients with metastatic disease will not have it detected by SLN biopsy in endometrial cancer. A low risk of allergic reaction to the dye, <0.2% for ICG, has been reported. We also discussed that in the case of failed mapping, which occurs 40% of the time, a bilateral or unilateral lymphadenectomy will be performed at the surgeon's discretion.   Potential benefits of sentinel nodes including a higher detection rate for metastasis due to ultrastaging and potential reduction in operative morbidity. However, there remains uncertainty as to the role for treatment of micrometastatic disease. Further, the benefit of operative morbidity associated with the SLN  technique in endometrial cancer is not yet completely known. In other patient populations (e.g. the cervical cancer population) there has been observed reductions in morbidity with SLN biopsy compared to pelvic lymphadenectomy. Lymphedema, nerve dysfunction and lymphocysts are all potential risks with the SLN technique as with complete lymphadenectomy. Additional risks to the patient include the risk of damage to an internal organ while operating in an altered view (e.g. the black and white image of the robotic fluorescence imaging mode).   The patient was consented for a robotic assisted hysterectomy, bilateral salpingo-oophorectomy, sentinel lymph node evaluation, possible lymph node dissection, omentectomy, possible laparotomy. The risks of surgery were discussed in detail and she understands these to include infection; wound separation; hernia; vaginal cuff separation, injury to adjacent organs such as bowel, bladder, blood vessels, ureters and nerves; bleeding which may require blood transfusion; anesthesia risk; thromboembolic events; possible death; unforeseen complications; possible need for re-exploration; medical complications such as heart attack, stroke, pleural effusion and pneumonia; and, if full lymphadenectomy is performed the risk of lymphedema and lymphocyst. The patient will receive DVT and antibiotic prophylaxis as indicated. She voiced a clear understanding. She had the opportunity to ask questions. Perioperative instructions were reviewed with her. Prescriptions for post-op medications were sent to her pharmacy of choice.  Will reach out to pathology to see if they are able to further delineate tumor histology.  I have asked my office to reach out to her neurologist regarding any perioperative considerations in the setting of her multiple sclerosis.  We discussed her pancreatic lesion, which was stable on imaging from 12/2021.  Radiologist recommends repeat imaging in 6 months.  It  sounds like the patient has also spoken with her primary care provider about referral to GI.  We will plan on MRI of the pancreas in 6 months if she does not see GI before then.  Given her history of provoked VTE (pulmonary embolism on OCPs), discussed 2 weeks of DVT prophylaxis after surgery in the setting of major surgery and cancer diagnosis.  A copy of this note was sent to the patient's referring provider.   75 minutes of total time was spent for this patient encounter, including preparation, face-to-face counseling with the patient and coordination of care, and documentation of the encounter.  Comer Dollar, MD  Division of Gynecologic Oncology  Department of Obstetrics and Gynecology  [facility]  ___________________________________________  Chief Complaint: No chief complaint on file.   History of Present Illness:  Joan [name] is a 68 y.o. y.o. female who is seen in consultation at the request of Dr. Fogleman for an evaluation of endometrial cancer.  Patient reports overall doing well.  She comes in with her 2 sisters today.  She states that in November, she had some discharge that she describes as brown or coffee colored in appearance. She saw her primary care provider and was noted to have lead in her urine.  CT scan was ordered and referral to urology made.  Based on CT scan results showing thickened endometrium, she was referred to gynecology.  After her endometrial biopsy, notable for significant drainage of fluid, she had about a 20-minute episode of vaginal bleeding.  She denies any other vaginal bleeding.  She denies associated pelvic pain or cramping.  She endorses a good appetite without nausea or emesis.  She reports normal bowel function.  She has some urinary urgency as well as urge urinary incontinence and nocturia.  Since her endometrial biopsy with drainage of fluid, she notes that her urinary symptoms are significantly  improved.  Sclerosis is well-controlled.  She has prednisone  prescription if needed in the setting of a flare.  She describes flares as infrequent.  Will often notice numbness in her leg with a flare.  Treatment History: Oncology History Overview Note  Patient initially noticed postmenopausal bleeding in November.   Endometrial cancer (HCC)  03/05/2023 Imaging   CT A/P: 1. No specific urinary tract abnormality is identified to explain the patient's hematuria. 2. Abnormal fluid density widening of the endometrial stripe 2.2 cm in diameter. Further investigation with dedicated pelvic sonography is recommended to exclude underlying malignancy. 3. Fluid density lesion in the head of the pancreas measuring 1.7 by 0.9 by 1.5 cm. Retrospectively stable and nonenhancing on MRI of 01/02/2022, establishing 1 year stability. Possibilities include intraductal papillary mucinous neoplasm or postinflammatory cystic lesion. Follow up pancreatic protocol MRI with and without contrast is recommended in 6 months time 4 standard surveillance. Alternatively, endoscopic ultrasound with fine-needle aspiration could be performed. This recommendation follows ACR consensus guidelines: Management of Incidental Pancreatic Cysts: A White Paper of the ACR Incidental Findings Committee. J Am Coll Radiol 2017;14:911-923. 4. Aortic atherosclerosis. 5. Lower thoracic and lumbar spondylosis and degenerative disc disease.   03/17/2023 Imaging   Pelvic ultrasound: The uterus is anteverted in position and measures 8.0 x 5.3 x 6.0 cm. It demonstrates a 4.4 x 3.4 cm cystic area, containing some debris, that occupies the majority of the body and fundus. There is a 2.5 x 1.7 x 1.7 cm hyperechoic lesion with irregular borders within the cervix that appears to demonstrate multiple echogenic foci and internal vascularity. The endometrium is not visualized.   The bilateral ovaries are not identified.   There  is no fluid present within  the cul-de-sac.   04/01/2023 Initial Biopsy   Endometrial biopsy shows poorly differentiated endometrial adenocarcinoma, p16 positive, p53 abnormal.   04/15/2023 Initial Diagnosis   Endometrial cancer [facility])    PAST MEDICAL HISTORY:  Past Medical History:  Diagnosis Date   Colitis, ischemic (HCC) 2003   Headaches, cluster    Hypercholesterolemia    Hypertension    Insomnia    Multiple sclerosis (HCC) 05/29/2018   PE (pulmonary embolism)    ?birth control, in 70s   PONV (postoperative nausea and vomiting)    S/P colonoscopy [id number]   2004: tubular adeboma, 2007: hyperplastic polyps. Due 2012   Seasonal allergies    Tobacco abuse    Tubular adenoma 2004   colonoscopy 2004     PAST SURGICAL HISTORY:  Past Surgical History:  Procedure Laterality Date   BREAST BIOPSY Left    benign   COLONOSCOPY  09/17/05   20 cm polyp removed/bx/middescending colon polyp removed with snare   COLONOSCOPY  2003   tubular adenoma removed from cecum    COLONOSCOPY  03/31/2011   RMR, diverticulosis, multiple colon polyps removed (tubular adenomas). next TCS 03/2016   COLONOSCOPY N/A 10/22/2015   Procedure: COLONOSCOPY;  Surgeon: Lamar CHRISTELLA Hollingshead, MD;  Location: [facility];  Service: Endoscopy;  Laterality: N/A;  730   COLONOSCOPY N/A 02/05/2021   Procedure: COLONOSCOPY;  Surgeon: Hollingshead Lamar CHRISTELLA, MD;  Location: [facility];  Service: Endoscopy;  Laterality: N/A;  ASA II / 9:30   ESOPHAGOGASTRODUODENOSCOPY N/A 02/15/2015   RMR, mild reflux esophagitis. empirical dilation of esophagus.    MALONEY DILATION N/A 02/15/2015   Procedure: AGAPITO DILATION;  Surgeon: Lamar CHRISTELLA Hollingshead, MD;  Location: [facility];  Service: Endoscopy;  Laterality: N/A;   POLYPECTOMY  02/05/2021   Procedure: POLYPECTOMY;  Surgeon: Hollingshead Lamar CHRISTELLA, MD;  Location: [facility];  Service: Endoscopy;;   TUBAL LIGATION      OB/GYN HISTORY:  OB History  Gravida Para Term Preterm AB Living  2 2      SAB IAB Ectopic  Multiple Live Births          # Outcome Date GA Lbr Len/2nd Weight Sex Type Anes PTL Lv  2 Para           1 Para             No LMP recorded. Patient is postmenopausal.  Age at menarche: 56  Age at menopause: 54 Hx of HRT: denies Hx of STDs: denies Last pap: 12/2021 - ASCUS, HR HPV negative History of abnormal pap smears: see above  SCREENING STUDIES:  Last mammogram: 04/2022  Last colonoscopy: 2022 Last bone mineral density: 2024  MEDICATIONS: Outpatient Encounter Medications as of 04/16/2023  Medication Sig   acetaminophen  (TYLENOL ) 500 MG tablet Take 500 mg by mouth every 6 (six) hours as needed (as needed for pain).   Cholecalciferol  (VITAMIN D ) 50 MCG (2000 UT) tablet Take 2,000-4,000 Units by mouth See admin instructions. Take 2000 units by mouth every other day, alternating with 4000 units on alternate days   Dimethyl Fumarate  240 MG CPDR Take 1 capsule (240 mg total) by mouth 2 (two) times daily.   rosuvastatin  (CRESTOR ) 10 MG tablet Take 1 tablet (10 mg total) by mouth daily.   senna-docusate (SENOKOT-S) 8.6-50 MG tablet Take 2 tablets by mouth at bedtime. For AFTER surgery, do not take if having diarrhea   traMADol  (ULTRAM ) 50 MG tablet Take  1 tablet (50 mg total) by mouth every 6 (six) hours as needed for severe pain (pain score 7-10). For AFTER surgery only, do not take and drive   valsartan -hydrochlorothiazide  (DIOVAN -HCT) 80-12.5 MG tablet Take 1 tablet by mouth daily.   [DISCONTINUED] predniSONE  (STERAPRED UNI-PAK 21 TAB) 10 MG (21) TBPK tablet take as directed (Patient not taking: Reported on 04/09/2023)   No facility-administered encounter medications on file as of 04/16/2023.    ALLERGIES:  No Known Allergies   FAMILY HISTORY:  Family History  Problem Relation Age of Onset   CVA Father    Hypertension Father    Breast cancer Maternal Aunt    Breast cancer Paternal Aunt    Colon cancer Neg Hx    Ovarian cancer Neg Hx    Endometrial cancer Neg Hx     Pancreatic cancer Neg Hx    Prostate cancer Neg Hx   Reports that she has a cousin on both maternal and paternal side with cervical cancer.  SOCIAL HISTORY:  Social Connections: Not on file    REVIEW OF SYSTEMS:  + Urinary frequency, blood in urine, back pain Denies appetite changes, fevers, chills, fatigue, unexplained weight changes. Denies hearing loss, neck lumps or masses, mouth sores, ringing in ears or voice changes. Denies cough or wheezing.  Denies shortness of breath. Denies chest pain or palpitations. Denies leg swelling. Denies abdominal distention, pain, blood in stools, constipation, diarrhea, nausea, vomiting, or early satiety. Denies pain with intercourse, dysuria, or incontinence. Denies hot flashes, pelvic pain, vaginal bleeding or vaginal discharge.   Denies joint pain or muscle pain/cramps. Denies itching, rash, or wounds. Denies dizziness, headaches, numbness or seizures. Denies swollen lymph nodes or glands, denies easy bruising or bleeding. Denies anxiety, depression, confusion, or decreased concentration.  Physical Exam:  Vital Signs for this encounter:  Blood pressure (!) 119/49, pulse 80, temperature 98.1 F (36.7 C), temperature source Oral, resp. rate 20, height 5' 6 (1.676 m), weight 208 lb 6.4 oz (94.5 kg), SpO2 98%. Body mass index is 33.64 kg/m. General: Alert, oriented, no acute distress.  HEENT: Normocephalic, atraumatic. Sclera anicteric.  Chest: Clear to auscultation bilaterally. No wheezes, rhonchi, or rales. Cardiovascular: Regular rate and rhythm, no murmurs, rubs, or gallops.  Abdomen: Obese. Normoactive bowel sounds. Soft, nondistended, nontender to palpation. No masses or hepatosplenomegaly appreciated. No palpable fluid wave.  Extremities: Grossly normal range of motion. Warm, well perfused. No edema bilaterally.  Skin: No rashes or lesions.  Lymphatics: No cervical, supraclavicular, or inguinal adenopathy.  GU:  Normal external female  genitalia. No lesions. No discharge or bleeding.             Bladder/urethra:  No lesions or masses, well supported bladder             Vagina: Mildly atrophic, no lesions.             Cervix: Normal appearing, no lesions.             Uterus: Small, mobile, no parametrial involvement or nodularity.             Adnexa: No masses appreciated.  Rectal: Deferred.  LABORATORY AND RADIOLOGIC DATA:  Outside medical records were reviewed to synthesize the above history, along with the history and physical obtained during the visit.   Lab Results  Component Value Date   WBC 10.1 05/29/2018   HGB 14.0 05/29/2018   HCT 42.1 05/29/2018   PLT 302 05/29/2018   GLUCOSE 148 (H) 05/30/2018  ALT 15 06/23/2018   AST 15 06/23/2018   NA 137 05/30/2018   K 4.0 05/30/2018   CL 105 05/30/2018   CREATININE 0.72 05/30/2018   BUN 23 05/30/2018   CO2 22 05/30/2018   TSH 2.227 05/29/2018   INR 1.0 09/15/2008

## 2023-04-15 NOTE — H&P (View-Only) (Signed)
 GYNECOLOGIC ONCOLOGY NEW PATIENT CONSULTATION   Patient Name: Joan [name]  Patient Age: 68 y.o. Date of Service: 04/16/23 Referring Provider: Dr. Burnard Bowers  Primary Care Provider: Shona Norleen PEDLAR, MD Consulting Provider: Comer Dollar, MD   Assessment/Plan:  Postmenopausal patient with clinical stage I high-grade endometrial adenocarcinoma.  We reviewed the nature of endometrial cancer and its recommended surgical staging, including total hysterectomy, bilateral salpingo-oophorectomy, and lymph node assessment. The patient is a suitable candidate for staging via a minimally invasive approach to surgery.  We reviewed that robotic assistance would be used to complete the surgery.   We discussed that most endometrial cancer is detected early, however, we reviewed that adjuvant therapy will likely be recommended based on the patient's biopsy, however, we will defer to final pathology results.    CT scan performed recently was negative for metastatic disease.  We reviewed the sentinel lymph node technique. Risks and benefits of sentinel lymph node biopsy was reviewed. We reviewed the technique and ICG dye. The patient DOES NOT have an iodine allergy or known liver dysfunction. We reviewed the false negative rate (0.4%), and that 3% of patients with metastatic disease will not have it detected by SLN biopsy in endometrial cancer. A low risk of allergic reaction to the dye, <0.2% for ICG, has been reported. We also discussed that in the case of failed mapping, which occurs 40% of the time, a bilateral or unilateral lymphadenectomy will be performed at the surgeon's discretion.   Potential benefits of sentinel nodes including a higher detection rate for metastasis due to ultrastaging and potential reduction in operative morbidity. However, there remains uncertainty as to the role for treatment of micrometastatic disease. Further, the benefit of operative morbidity associated with the SLN  technique in endometrial cancer is not yet completely known. In other patient populations (e.g. the cervical cancer population) there has been observed reductions in morbidity with SLN biopsy compared to pelvic lymphadenectomy. Lymphedema, nerve dysfunction and lymphocysts are all potential risks with the SLN technique as with complete lymphadenectomy. Additional risks to the patient include the risk of damage to an internal organ while operating in an altered view (e.g. the black and white image of the robotic fluorescence imaging mode).   The patient was consented for a robotic assisted hysterectomy, bilateral salpingo-oophorectomy, sentinel lymph node evaluation, possible lymph node dissection, omentectomy, possible laparotomy. The risks of surgery were discussed in detail and she understands these to include infection; wound separation; hernia; vaginal cuff separation, injury to adjacent organs such as bowel, bladder, blood vessels, ureters and nerves; bleeding which may require blood transfusion; anesthesia risk; thromboembolic events; possible death; unforeseen complications; possible need for re-exploration; medical complications such as heart attack, stroke, pleural effusion and pneumonia; and, if full lymphadenectomy is performed the risk of lymphedema and lymphocyst. The patient will receive DVT and antibiotic prophylaxis as indicated. She voiced a clear understanding. She had the opportunity to ask questions. Perioperative instructions were reviewed with her. Prescriptions for post-op medications were sent to her pharmacy of choice.  Will reach out to pathology to see if they are able to further delineate tumor histology.  I have asked my office to reach out to her neurologist regarding any perioperative considerations in the setting of her multiple sclerosis.  We discussed her pancreatic lesion, which was stable on imaging from 12/2021.  Radiologist recommends repeat imaging in 6 months.  It  sounds like the patient has also spoken with her primary care provider about referral to GI.  We will plan on MRI of the pancreas in 6 months if she does not see GI before then.  Given her history of provoked VTE (pulmonary embolism on OCPs), discussed 2 weeks of DVT prophylaxis after surgery in the setting of major surgery and cancer diagnosis.  A copy of this note was sent to the patient's referring provider.   75 minutes of total time was spent for this patient encounter, including preparation, face-to-face counseling with the patient and coordination of care, and documentation of the encounter.  Comer Dollar, MD  Division of Gynecologic Oncology  Department of Obstetrics and Gynecology  [facility]  ___________________________________________  Chief Complaint: No chief complaint on file.   History of Present Illness:  Joan [name] is a 68 y.o. y.o. female who is seen in consultation at the request of Dr. Fogleman for an evaluation of endometrial cancer.  Patient reports overall doing well.  She comes in with her 2 sisters today.  She states that in November, she had some discharge that she describes as brown or coffee colored in appearance. She saw her primary care provider and was noted to have lead in her urine.  CT scan was ordered and referral to urology made.  Based on CT scan results showing thickened endometrium, she was referred to gynecology.  After her endometrial biopsy, notable for significant drainage of fluid, she had about a 20-minute episode of vaginal bleeding.  She denies any other vaginal bleeding.  She denies associated pelvic pain or cramping.  She endorses a good appetite without nausea or emesis.  She reports normal bowel function.  She has some urinary urgency as well as urge urinary incontinence and nocturia.  Since her endometrial biopsy with drainage of fluid, she notes that her urinary symptoms are significantly  improved.  Sclerosis is well-controlled.  She has prednisone  prescription if needed in the setting of a flare.  She describes flares as infrequent.  Will often notice numbness in her leg with a flare.  Treatment History: Oncology History Overview Note  Patient initially noticed postmenopausal bleeding in November.   Endometrial cancer (HCC)  03/05/2023 Imaging   CT A/P: 1. No specific urinary tract abnormality is identified to explain the patient's hematuria. 2. Abnormal fluid density widening of the endometrial stripe 2.2 cm in diameter. Further investigation with dedicated pelvic sonography is recommended to exclude underlying malignancy. 3. Fluid density lesion in the head of the pancreas measuring 1.7 by 0.9 by 1.5 cm. Retrospectively stable and nonenhancing on MRI of 01/02/2022, establishing 1 year stability. Possibilities include intraductal papillary mucinous neoplasm or postinflammatory cystic lesion. Follow up pancreatic protocol MRI with and without contrast is recommended in 6 months time 4 standard surveillance. Alternatively, endoscopic ultrasound with fine-needle aspiration could be performed. This recommendation follows ACR consensus guidelines: Management of Incidental Pancreatic Cysts: A White Paper of the ACR Incidental Findings Committee. J Am Coll Radiol 2017;14:911-923. 4. Aortic atherosclerosis. 5. Lower thoracic and lumbar spondylosis and degenerative disc disease.   03/17/2023 Imaging   Pelvic ultrasound: The uterus is anteverted in position and measures 8.0 x 5.3 x 6.0 cm. It demonstrates a 4.4 x 3.4 cm cystic area, containing some debris, that occupies the majority of the body and fundus. There is a 2.5 x 1.7 x 1.7 cm hyperechoic lesion with irregular borders within the cervix that appears to demonstrate multiple echogenic foci and internal vascularity. The endometrium is not visualized.   The bilateral ovaries are not identified.   There  is no fluid present within  the cul-de-sac.   04/01/2023 Initial Biopsy   Endometrial biopsy shows poorly differentiated endometrial adenocarcinoma, p16 positive, p53 abnormal.   04/15/2023 Initial Diagnosis   Endometrial cancer [facility])    PAST MEDICAL HISTORY:  Past Medical History:  Diagnosis Date   Colitis, ischemic (HCC) 2003   Headaches, cluster    Hypercholesterolemia    Hypertension    Insomnia    Multiple sclerosis (HCC) 05/29/2018   PE (pulmonary embolism)    ?birth control, in 70s   PONV (postoperative nausea and vomiting)    S/P colonoscopy [id number]   2004: tubular adeboma, 2007: hyperplastic polyps. Due 2012   Seasonal allergies    Tobacco abuse    Tubular adenoma 2004   colonoscopy 2004     PAST SURGICAL HISTORY:  Past Surgical History:  Procedure Laterality Date   BREAST BIOPSY Left    benign   COLONOSCOPY  09/17/05   20 cm polyp removed/bx/middescending colon polyp removed with snare   COLONOSCOPY  2003   tubular adenoma removed from cecum    COLONOSCOPY  03/31/2011   RMR, diverticulosis, multiple colon polyps removed (tubular adenomas). next TCS 03/2016   COLONOSCOPY N/A 10/22/2015   Procedure: COLONOSCOPY;  Surgeon: Lamar CHRISTELLA Hollingshead, MD;  [facility]: [facility];  Service: Endoscopy;  Laterality: N/A;  730   COLONOSCOPY N/A 02/05/2021   Procedure: COLONOSCOPY;  Surgeon: Hollingshead Lamar CHRISTELLA, MD;  Location: [facility];  Service: Endoscopy;  Laterality: N/A;  ASA II / 9:30   ESOPHAGOGASTRODUODENOSCOPY N/A 02/15/2015   RMR, mild reflux esophagitis. empirical dilation of esophagus.    MALONEY DILATION N/A 02/15/2015   Procedure: AGAPITO DILATION;  Surgeon: Lamar CHRISTELLA Hollingshead, MD;  Location: [facility];  Service: Endoscopy;  Laterality: N/A;   POLYPECTOMY  02/05/2021   Procedure: POLYPECTOMY;  Surgeon: Hollingshead Lamar CHRISTELLA, MD;  Location: [facility];  Service: Endoscopy;;   TUBAL LIGATION      OB/GYN HISTORY:  OB History  Gravida Para Term Preterm AB Living  2 2      SAB IAB Ectopic  Multiple Live Births          # Outcome Date GA Lbr Len/2nd Weight Sex Type Anes PTL Lv  2 Para           1 Para             No LMP recorded. Patient is postmenopausal.  Age at menarche: 56  Age at menopause: 54 Hx of HRT: denies Hx of STDs: denies Last pap: 12/2021 - ASCUS, HR HPV negative History of abnormal pap smears: see above  SCREENING STUDIES:  Last mammogram: 04/2022  Last colonoscopy: 2022 Last bone mineral density: 2024  MEDICATIONS: Outpatient Encounter Medications as of 04/16/2023  Medication Sig   acetaminophen  (TYLENOL ) 500 MG tablet Take 500 mg by mouth every 6 (six) hours as needed (as needed for pain).   Cholecalciferol  (VITAMIN D ) 50 MCG (2000 UT) tablet Take 2,000-4,000 Units by mouth See admin instructions. Take 2000 units by mouth every other day, alternating with 4000 units on alternate days   Dimethyl Fumarate  240 MG CPDR Take 1 capsule (240 mg total) by mouth 2 (two) times daily.   rosuvastatin  (CRESTOR ) 10 MG tablet Take 1 tablet (10 mg total) by mouth daily.   senna-docusate (SENOKOT-S) 8.6-50 MG tablet Take 2 tablets by mouth at bedtime. For AFTER surgery, do not take if having diarrhea   traMADol  (ULTRAM ) 50 MG tablet Take  1 tablet (50 mg total) by mouth every 6 (six) hours as needed for severe pain (pain score 7-10). For AFTER surgery only, do not take and drive   valsartan -hydrochlorothiazide  (DIOVAN -HCT) 80-12.5 MG tablet Take 1 tablet by mouth daily.   [DISCONTINUED] predniSONE  (STERAPRED UNI-PAK 21 TAB) 10 MG (21) TBPK tablet take as directed (Patient not taking: Reported on 04/09/2023)   No facility-administered encounter medications on file as of 04/16/2023.    ALLERGIES:  No Known Allergies   FAMILY HISTORY:  Family History  Problem Relation Age of Onset   CVA Father    Hypertension Father    Breast cancer Maternal Aunt    Breast cancer Paternal Aunt    Colon cancer Neg Hx    Ovarian cancer Neg Hx    Endometrial cancer Neg Hx     Pancreatic cancer Neg Hx    Prostate cancer Neg Hx   Reports that she has a cousin on both maternal and paternal side with cervical cancer.  SOCIAL HISTORY:  Social Connections: Not on file    REVIEW OF SYSTEMS:  + Urinary frequency, blood in urine, back pain Denies appetite changes, fevers, chills, fatigue, unexplained weight changes. Denies hearing loss, neck lumps or masses, mouth sores, ringing in ears or voice changes. Denies cough or wheezing.  Denies shortness of breath. Denies chest pain or palpitations. Denies leg swelling. Denies abdominal distention, pain, blood in stools, constipation, diarrhea, nausea, vomiting, or early satiety. Denies pain with intercourse, dysuria, or incontinence. Denies hot flashes, pelvic pain, vaginal bleeding or vaginal discharge.   Denies joint pain or muscle pain/cramps. Denies itching, rash, or wounds. Denies dizziness, headaches, numbness or seizures. Denies swollen lymph nodes or glands, denies easy bruising or bleeding. Denies anxiety, depression, confusion, or decreased concentration.  Physical Exam:  Vital Signs for this encounter:  Blood pressure (!) 119/49, pulse 80, temperature 98.1 F (36.7 C), temperature source Oral, resp. rate 20, height 5' 6 (1.676 m), weight 208 lb 6.4 oz (94.5 kg), SpO2 98%. Body mass index is 33.64 kg/m. General: Alert, oriented, no acute distress.  HEENT: Normocephalic, atraumatic. Sclera anicteric.  Chest: Clear to auscultation bilaterally. No wheezes, rhonchi, or rales. Cardiovascular: Regular rate and rhythm, no murmurs, rubs, or gallops.  Abdomen: Obese. Normoactive bowel sounds. Soft, nondistended, nontender to palpation. No masses or hepatosplenomegaly appreciated. No palpable fluid wave.  Extremities: Grossly normal range of motion. Warm, well perfused. No edema bilaterally.  Skin: No rashes or lesions.  Lymphatics: No cervical, supraclavicular, or inguinal adenopathy.  GU:  Normal external female  genitalia. No lesions. No discharge or bleeding.             Bladder/urethra:  No lesions or masses, well supported bladder             Vagina: Mildly atrophic, no lesions.             Cervix: Normal appearing, no lesions.             Uterus: Small, mobile, no parametrial involvement or nodularity.             Adnexa: No masses appreciated.  Rectal: Deferred.  LABORATORY AND RADIOLOGIC DATA:  Outside medical records were reviewed to synthesize the above history, along with the history and physical obtained during the visit.   Lab Results  Component Value Date   WBC 10.1 05/29/2018   HGB 14.0 05/29/2018   HCT 42.1 05/29/2018   PLT 302 05/29/2018   GLUCOSE 148 (H) 05/30/2018  ALT 15 06/23/2018   AST 15 06/23/2018   NA 137 05/30/2018   K 4.0 05/30/2018   CL 105 05/30/2018   CREATININE 0.72 05/30/2018   BUN 23 05/30/2018   CO2 22 05/30/2018   TSH 2.227 05/29/2018   INR 1.0 09/15/2008

## 2023-04-16 ENCOUNTER — Other Ambulatory Visit (HOSPITAL_COMMUNITY): Payer: Self-pay

## 2023-04-16 ENCOUNTER — Encounter: Payer: Self-pay | Admitting: Gynecologic Oncology

## 2023-04-16 ENCOUNTER — Inpatient Hospital Stay: Payer: PPO | Attending: Gynecologic Oncology | Admitting: Gynecologic Oncology

## 2023-04-16 ENCOUNTER — Inpatient Hospital Stay (HOSPITAL_BASED_OUTPATIENT_CLINIC_OR_DEPARTMENT_OTHER): Payer: PPO | Admitting: Gynecologic Oncology

## 2023-04-16 ENCOUNTER — Telehealth: Payer: Self-pay | Admitting: *Deleted

## 2023-04-16 VITALS — BP 119/49 | HR 80 | Temp 98.1°F | Resp 20 | Ht 66.0 in | Wt 208.4 lb

## 2023-04-16 DIAGNOSIS — N3941 Urge incontinence: Secondary | ICD-10-CM | POA: Diagnosis not present

## 2023-04-16 DIAGNOSIS — D398 Neoplasm of uncertain behavior of other specified female genital organs: Secondary | ICD-10-CM

## 2023-04-16 DIAGNOSIS — N950 Postmenopausal bleeding: Secondary | ICD-10-CM | POA: Diagnosis not present

## 2023-04-16 DIAGNOSIS — R351 Nocturia: Secondary | ICD-10-CM | POA: Diagnosis not present

## 2023-04-16 DIAGNOSIS — E66811 Obesity, class 1: Secondary | ICD-10-CM | POA: Diagnosis not present

## 2023-04-16 DIAGNOSIS — E7800 Pure hypercholesterolemia, unspecified: Secondary | ICD-10-CM | POA: Insufficient documentation

## 2023-04-16 DIAGNOSIS — C541 Malignant neoplasm of endometrium: Secondary | ICD-10-CM | POA: Diagnosis not present

## 2023-04-16 DIAGNOSIS — K869 Disease of pancreas, unspecified: Secondary | ICD-10-CM

## 2023-04-16 DIAGNOSIS — G35 Multiple sclerosis: Secondary | ICD-10-CM | POA: Diagnosis not present

## 2023-04-16 DIAGNOSIS — Z86711 Personal history of pulmonary embolism: Secondary | ICD-10-CM | POA: Diagnosis not present

## 2023-04-16 DIAGNOSIS — Z6833 Body mass index (BMI) 33.0-33.9, adult: Secondary | ICD-10-CM | POA: Insufficient documentation

## 2023-04-16 DIAGNOSIS — I10 Essential (primary) hypertension: Secondary | ICD-10-CM | POA: Insufficient documentation

## 2023-04-16 DIAGNOSIS — D378 Neoplasm of uncertain behavior of other specified digestive organs: Secondary | ICD-10-CM | POA: Insufficient documentation

## 2023-04-16 DIAGNOSIS — K559 Vascular disorder of intestine, unspecified: Secondary | ICD-10-CM | POA: Diagnosis not present

## 2023-04-16 MED ORDER — TRAMADOL HCL 50 MG PO TABS
50.0000 mg | ORAL_TABLET | Freq: Four times a day (QID) | ORAL | 0 refills | Status: DC | PRN
  Filled 2023-04-16 (×2): qty 10, 3d supply, fill #0

## 2023-04-16 MED ORDER — SENNOSIDES-DOCUSATE SODIUM 8.6-50 MG PO TABS
2.0000 | ORAL_TABLET | Freq: Every day | ORAL | 0 refills | Status: DC
  Filled 2023-04-16: qty 30, 15d supply, fill #0

## 2023-04-16 NOTE — Patient Instructions (Signed)
 We are going to reach out to Dr. Vear for recommendations regarding your MS and medications around the time of surgery.  Preparing for your Surgery  Plan for surgery on April 21, 2023 with Dr. Comer Dollar at [facility]. You will be scheduled for robotic assisted total laparoscopic hysterectomy (removal of the uterus and cervix), bilateral salpingo-oophorectomy (removal of both ovaries and fallopian tubes), sentinel lymph node biopsy, possible lymph node dissection, possible laparotomy (larger incision on your abdomen if needed), possible omentectomy.  Pre-operative Testing -You will receive a phone call from presurgical testing at [facility] to arrange for a pre-operative appointment and lab work.  -Bring your insurance card, copy of an advanced directive if applicable, medication list  -At that visit, you will be asked to sign a consent for a possible blood transfusion in case a transfusion becomes necessary during surgery.  The need for a blood transfusion is rare but having consent is a necessary part of your care.     -You should not be taking blood thinners or aspirin at least ten days prior to surgery unless instructed by your surgeon.  -Do not take supplements such as fish oil (omega 3), red yeast rice, turmeric before your surgery. STOP TAKING AT LEAST 10 DAYS BEFORE SURGERY. You want to avoid medications with aspirin in them including headache powders such as BC or Goody's), Excedrin migraine.  Day Before Surgery at Home -You will be asked to take in a light diet the day before surgery. You will be advised you can have clear liquids up until 3 hours before your surgery.    Eat a light diet the day before surgery.  Examples including soups, broths, toast, yogurt, mashed potatoes.  AVOID GAS PRODUCING FOODS AND BEVERAGES. Things to avoid include carbonated beverages (fizzy beverages, sodas), raw fruits and raw vegetables (uncooked), or beans.   If your bowels  are filled with gas, your surgeon will have difficulty visualizing your pelvic organs which increases your surgical risks.  Your role in recovery Your role is to become active as soon as directed by your doctor, while still giving yourself time to heal.  Rest when you feel tired. You will be asked to do the following in order to speed your recovery:  - Cough and breathe deeply. This helps to clear and expand your lungs and can prevent pneumonia after surgery.  - STAY ACTIVE WHEN YOU GET HOME. Do mild physical activity. Walking or moving your legs help your circulation and body functions return to normal. Do not try to get up or walk alone the first time after surgery.   -If you develop swelling on one leg or the other, pain in the back of your leg, redness/warmth in one of your legs, please call the office or go to the Emergency Room to have a doppler to rule out a blood clot. For shortness of breath, chest pain-seek care in the Emergency Room as soon as possible. - Actively manage your pain. Managing your pain lets you move in comfort. We will ask you to rate your pain on a scale of zero to 10. It is your responsibility to tell your doctor or nurse where and how much you hurt so your pain can be treated.  Special Considerations -If you are diabetic, you may be placed on insulin  after surgery to have closer control over your blood sugars to promote healing and recovery.  This does not mean that you will be discharged on insulin .  If  applicable, your oral antidiabetics will be resumed when you are tolerating a solid diet.  -Your final pathology results from surgery should be available around one week after surgery and the results will be relayed to you when available.  -FMLA forms can be faxed to [phone] and please allow 5-7 business days for completion.  Pain Management After Surgery -You will be prescribed your pain medication and bowel regimen medications before surgery so that you can have  these available when you are discharged from the hospital. The pain medication is for use ONLY AFTER surgery and a new prescription will not be given.   -Make sure that you have Tylenol  and Ibuprofen  IF YOU ARE ABLE TO TAKE THESE MEDICATIONS at home to use on a regular basis after surgery for pain control. We recommend alternating the medications every hour to six hours since they work differently and are processed in the body differently for pain relief.  -Review the attached handout on narcotic use and their risks and side effects.   Bowel Regimen -You will be prescribed Sennakot-S to take nightly to prevent constipation especially if you are taking the narcotic pain medication intermittently.  It is important to prevent constipation and drink adequate amounts of liquids. You can stop taking this medication when you are not taking pain medication and you are back on your normal bowel routine.  Risks of Surgery Risks of surgery are low but include bleeding, infection, damage to surrounding structures, re-operation, blood clots, and very rarely death.   Blood Transfusion Information (For the consent to be signed before surgery)  We will be checking your blood type before surgery so in case of emergencies, we will know what type of blood you would need.                                            WHAT IS A BLOOD TRANSFUSION?  A transfusion is the replacement of blood or some of its parts. Blood is made up of multiple cells which provide different functions. Red blood cells carry oxygen and are used for blood loss replacement. White blood cells fight against infection. Platelets control bleeding. Plasma helps clot blood. Other blood products are available for specialized needs, such as hemophilia or other clotting disorders. BEFORE THE TRANSFUSION  Who gives blood for transfusions?  You may be able to donate blood to be used at a later date on yourself (autologous donation). Relatives can be  asked to donate blood. This is generally not any safer than if you have received blood from a stranger. The same precautions are taken to ensure safety when a relative's blood is donated. Healthy volunteers who are fully evaluated to make sure their blood is safe. This is blood bank blood. Transfusion therapy is the safest it has ever been in the practice of medicine. Before blood is taken from a donor, a complete history is taken to make sure that person has no history of diseases nor engages in risky social behavior (examples are intravenous drug use or sexual activity with multiple partners). The donor's travel history is screened to minimize risk of transmitting infections, such as malaria. The donated blood is tested for signs of infectious diseases, such as HIV and hepatitis. The blood is then tested to be sure it is compatible with you in order to minimize the chance of a transfusion reaction. If you or  a relative donates blood, this is often done in anticipation of surgery and is not appropriate for emergency situations. It takes many days to process the donated blood. RISKS AND COMPLICATIONS Although transfusion therapy is very safe and saves many lives, the main dangers of transfusion include:  Getting an infectious disease. Developing a transfusion reaction. This is an allergic reaction to something in the blood you were given. Every precaution is taken to prevent this. The decision to have a blood transfusion has been considered carefully by your caregiver before blood is given. Blood is not given unless the benefits outweigh the risks.  AFTER SURGERY INSTRUCTIONS  Return to work: 4-6 weeks if applicable  Activity: 1. Be up and out of the bed during the day.  Take a nap if needed.  You may walk up steps but be careful and use the hand rail.  Stair climbing will tire you more than you think, you may need to stop part way and rest.   2. No lifting or straining for 6 weeks over 10 pounds. No  pushing, pulling, straining for 6 weeks.  3. No driving for 4-89 days when the following criteria have been met: Do not drive if you are taking narcotic pain medicine and make sure that your reaction time has returned.   4. You can shower as soon as the next day after surgery. Shower daily.  Use your regular soap and water  (not directly on the incision) and pat your incision(s) dry afterwards; don't rub.  No tub baths or submerging your body in water  until cleared by your surgeon. If you have the soap that was given to you by pre-surgical testing that was used before surgery, you do not need to use it afterwards because this can irritate your incisions.   5. No sexual activity and nothing in the vagina for 10-12 weeks.  6. You may experience a small amount of clear drainage from your incisions, which is normal.  If the drainage persists, increases, or changes color please call the office.  7. Do not use creams, lotions, or ointments such as neosporin on your incisions after surgery until advised by your surgeon because they can cause removal of the dermabond glue on your incisions.    8. You may experience vaginal spotting after surgery or when the stitches at the top of the vagina begin to dissolve.  The spotting is normal but if you experience heavy bleeding, call our office.  9. Take Tylenol  or ibuprofen  first for pain if you are able to take these medications and only use narcotic pain medication for severe pain not relieved by the Tylenol  or Ibuprofen .  Monitor your Tylenol  intake to a max of 4,000 mg in a 24 hour period. You can alternate these medications after surgery.  Diet: 1. Low sodium Heart Healthy Diet is recommended but you are cleared to resume your normal (before surgery) diet after your procedure.  2. It is safe to use a laxative, such as Miralax or Colace, if you have difficulty moving your bowels before surgery. You have been prescribed Sennakot-S to take at bedtime every  evening after surgery to keep bowel movements regular and to prevent constipation.    Wound Care: 1. Keep clean and dry.  Shower daily.  Reasons to call the Doctor: Fever - Oral temperature greater than 100.4 degrees Fahrenheit Foul-smelling vaginal discharge Difficulty urinating Nausea and vomiting Increased pain at the site of the incision that is unrelieved with pain medicine. Difficulty breathing with  or without chest pain New calf pain especially if only on one side Sudden, continuing increased vaginal bleeding with or without clots.   Contacts: For questions or concerns you should contact:  Dr. Comer Dollar at [phone]  Eleanor Epps, NP at [phone]  After Hours: call [phone] and have the GYN Oncologist paged/contacted (after 5 pm or on the weekends). You will speak with an after hours RN and let he or she know you have had surgery.  Messages sent via mychart are for non-urgent matters and are not responded to after hours so for urgent needs, please call the after hours number.

## 2023-04-16 NOTE — Progress Notes (Signed)
 Patient here for new patient consultation with Dr. Viktoria and for a pre-operative appointment prior to her scheduled surgery on 04/21/2023. She is scheduled for robotic assisted total laparoscopic hysterectomy, bilateral salpingo-oophorectomy, sentinel lymph node biopsy, possible lymph node dissection, possible laparotomy, possible omentectomy. The surgery was discussed in detail.  See after visit summary for additional details.    Discussed post-op pain management in detail including the aspects of the enhanced recovery pathway.  Advised her that a new prescription would be sent in for tramadol  and it is only to be used for after her upcoming surgery.  We discussed the use of tylenol  post-op and to monitor for a maximum of 4,000 mg in a 24 hour period.  Also prescribed sennakot to be used after surgery and to hold if having loose stools.  Discussed bowel regimen in detail.     Discussed measures to take at home to prevent DVT including frequent mobility.  Reportable signs and symptoms of DVT discussed. Post-operative instructions discussed and expectations for after surgery. Incisional care discussed as well including reportable signs and symptoms including erythema, drainage, wound separation.     10 minutes spent preparing information and with the patient.  Verbalizing understanding of material discussed. No needs or concerns voiced at the end of the visit.   Advised patient to call for any needs.  Advised that her post-operative medications had been prescribed and could be picked up at any time.    This appointment is included in the global surgical bundle as pre-operative teaching and has no charge.

## 2023-04-16 NOTE — Telephone Encounter (Signed)
 Per Dr Pricilla Holm fax surgical optimization form to her neurology office (Dr Epimenio Foot [phone])

## 2023-04-19 ENCOUNTER — Telehealth: Payer: Self-pay | Admitting: *Deleted

## 2023-04-19 NOTE — Progress Notes (Addendum)
 COVID Vaccine received:  []  No [x]  Yes Date of any COVID positive Test in last 90 days: no PCP - Norleen Hurst MD Cardiologist - none [facility]  Chest x-ray - 12/31/22 Epic EKG -  04/20/23 Epic Stress Test -  ECHO -  Cardiac Cath -   Bowel Prep - [x]  No  []   Yes ______  Pacemaker / ICD device [x]  No []  Yes   Spinal Cord Stimulator:[x]  No []  Yes       History of Sleep Apnea? [x]  No []  Yes   CPAP used?- [x]  No []  Yes    Does the patient monitor blood sugar?          [x]  No []  Yes  []  N/A  Patient has: [x]  NO Hx DM   []  Pre-DM                 []  DM1  []   DM2 Does patient have a [employer] or Dexacom? []  No []  Yes   Fasting Blood Sugar Ranges-  Checks Blood Sugar _____ times a day  GLP1 agonist / usual dose - no GLP1 instructions:  SGLT-2 inhibitors / usual dose - no SGLT-2 instructions:   Blood Thinner / Instructions:no Aspirin Instructions:no  Comments:   Activity level: Patient is able  to climb a flight of stairs without difficulty; [x]  No CP  [x]  No SOB,  ___   Patient can perform ADLs without assistance.   Anesthesia review: HTN,  MS, L ant. Fasc. block  Patient denies shortness of breath, fever, cough and chest pain at PAT appointment.  Patient verbalized understanding and agreement to the Pre-Surgical Instructions that were given to them at this PAT appointment. Patient was also educated of the need to review these PAT instructions again prior to his/her surgery.I reviewed the appropriate phone numbers to call if they have any and questions or concerns.

## 2023-04-19 NOTE — Telephone Encounter (Signed)
 Spoke with Ms. Nghiem and relayed message from Dr. Vear that patient is neurologically cleared to proceed with surgery and no medication changes are necessary. If NPO, Pt's MS medication may be held a few days. Advised patient the only dose that she would need to hold if she takes with food is the early morning prior to surgery on 04/21/23. Reminded patient that the office would call her tomorrow for pre-op call.  Pt verbalized understanding and thanked the office for calling.

## 2023-04-19 NOTE — Patient Instructions (Signed)
 SURGICAL WAITING ROOM VISITATION  Patients having surgery or a procedure may have no more than 2 support people in the waiting area - these visitors may rotate.    Children under the age of 66 must have an adult with them who is not the patient.  Due to an increase in RSV and influenza rates and associated hospitalizations, children ages 36 and under may not visit patients in [facility] hospitals.  If the patient needs to stay at the hospital during part of their recovery, the visitor guidelines for inpatient rooms apply. Pre-op nurse will coordinate an appropriate time for 1 support person to accompany patient in pre-op.  This support person may not rotate.    Please refer to the [facility] website for the visitor guidelines for Inpatients (after your surgery is over and you are in a regular room).       Your procedure is scheduled on: 04/21/23   Report to [facility] Main Entrance    Report to admitting at 9:45 AM   Call this number if you have problems the morning of surgery [phone]   Do not eat food :After Midnight.   After Midnight you may have the following liquids until 9 AM DAY OF SURGERY  Water  Non-Citrus Juices (without pulp, NO RED-Apple, White grape, White cranberry) Black Coffee (NO MILK/CREAM OR CREAMERS, sugar ok)  Clear Tea (NO MILK/CREAM OR CREAMERS, sugar ok) regular and decaf                             Plain Jell-O (NO RED)                                           Fruit ices (not with fruit pulp, NO RED)                                     Popsicles (NO RED)                                                               Sports drinks like Gatorade (NO RED)                  Oral Hygiene is also important to reduce your risk of infection.                                    Remember - BRUSH YOUR TEETH THE MORNING OF SURGERY WITH YOUR REGULAR TOOTHPASTE  DENTURES WILL BE REMOVED PRIOR TO SURGERY PLEASE DO NOT APPLY Poly grip OR  ADHESIVES!!!   Do NOT smoke after Midnight   Stop all vitamins and herbal supplements 7 days before surgery.   Take these medicines the morning of surgery with A SIP OF WATER : Rosuvastatin , Dimethyl fumerate and tylenol  if needed.               DO NOT TAKE DIOVAN  THE MORNING OF SURGERY.  You may not have any metal on your body including hair pins, jewelry, and body piercing             Do not wear make-up, lotions, powders, perfumes/cologne, or deodorant  Do not wear nail polish including gel and S&S, artificial/acrylic nails, or any other type of covering on natural nails including finger and toenails. If you have artificial nails, gel coating, etc. that needs to be removed by a nail salon please have this removed prior to surgery or surgery may need to be canceled/ delayed if the surgeon/ anesthesia feels like they are unable to be safely monitored.   Do not shave  48 hours prior to surgery.    Do not bring valuables to the hospital. [facility] IS NOT             RESPONSIBLE   FOR VALUABLES.   Contacts, glasses, dentures or bridgework may not be worn into surgery.  DO NOT BRING YOUR HOME MEDICATIONS TO THE HOSPITAL. PHARMACY WILL DISPENSE MEDICATIONS LISTED ON YOUR MEDICATION LIST TO YOU DURING YOUR ADMISSION IN THE HOSPITAL!    Patients discharged on the day of surgery will not be allowed to drive home.  Someone NEEDS to stay with you for the first 24 hours after anesthesia.   Special Instructions: Bring a copy of your healthcare power of attorney and living will documents the day of surgery if you haven't scanned them before.              Please read over the following fact sheets you were given: IF YOU HAVE QUESTIONS ABOUT YOUR PRE-OP INSTRUCTIONS PLEASE CALL [phone] Joan [name]   If you received a COVID test during your pre-op visit  it is requested that you wear a mask when out in public, stay away from anyone that may not be feeling well and notify your surgeon if  you develop symptoms. If you test positive for Covid or have been in contact with anyone that has tested positive in the last 10 days please notify you surgeon.    [facility] - Preparing for Surgery Before surgery, you can play an important role.  Because skin is not sterile, your skin needs to be as free of germs as possible.  You can reduce the number of germs on your skin by washing with CHG (chlorahexidine gluconate) soap before surgery.  CHG is an antiseptic cleaner which kills germs and bonds with the skin to continue killing germs even after washing. Please DO NOT use if you have an allergy to CHG or antibacterial soaps.  If your skin becomes reddened/irritated stop using the CHG and inform your nurse when you arrive at Short Stay. Do not shave (including legs and underarms) for at least 48 hours prior to the first CHG shower.  You may shave your face/neck.  Please follow these instructions carefully:  1.  Shower with CHG Soap the night before surgery and the  morning of surgery.  2.  If you choose to wash your hair, wash your hair first as usual with your normal  shampoo.  3.  After you shampoo, rinse your hair and body thoroughly to remove the shampoo.                             4.  Use CHG as you would any other liquid soap.  You can apply chg directly to the skin and wash.  Gently with a scrungie  or clean washcloth.  5.  Apply the CHG Soap to your body ONLY FROM THE NECK DOWN.   Do   not use on face/ open                           Wound or open sores. Avoid contact with eyes, ears mouth and   genitals (private parts).                       Wash face,  Genitals (private parts) with your normal soap.             6.  Wash thoroughly, paying special attention to the area where your    surgery  will be performed.  7.  Thoroughly rinse your body with warm water  from the neck down.  8.  DO NOT shower/wash with your normal soap after using and rinsing off the CHG Soap.                9.  Pat  yourself dry with a clean towel.            10.  Wear clean pajamas.            11.  Place clean sheets on your bed the night of your first shower and do not  sleep with pets. Day of Surgery : Do not apply any lotions/deodorants the morning of surgery.  Please wear clean clothes to the hospital/surgery center.  FAILURE TO FOLLOW THESE INSTRUCTIONS MAY RESULT IN THE CANCELLATION OF YOUR SURGERY  PATIENT SIGNATURE_________________________________  NURSE SIGNATURE__________________________________  ________________________________________________________________________ WHAT IS A BLOOD TRANSFUSION? Blood Transfusion Information  A transfusion is the replacement of blood or some of its parts. Blood is made up of multiple cells which provide different functions. Red blood cells carry oxygen and are used for blood loss replacement. White blood cells fight against infection. Platelets control bleeding. Plasma helps clot blood. Other blood products are available for specialized needs, such as hemophilia or other clotting disorders. BEFORE THE TRANSFUSION  Who gives blood for transfusions?  Healthy volunteers who are fully evaluated to make sure their blood is safe. This is blood bank blood. Transfusion therapy is the safest it has ever been in the practice of medicine. Before blood is taken from a donor, a complete history is taken to make sure that person has no history of diseases nor engages in risky social behavior (examples are intravenous drug use or sexual activity with multiple partners). The donor's travel history is screened to minimize risk of transmitting infections, such as malaria. The donated blood is tested for signs of infectious diseases, such as HIV and hepatitis. The blood is then tested to be sure it is compatible with you in order to minimize the chance of a transfusion reaction. If you or a relative donates blood, this is often done in anticipation of surgery and is not  appropriate for emergency situations. It takes many days to process the donated blood. RISKS AND COMPLICATIONS Although transfusion therapy is very safe and saves many lives, the main dangers of transfusion include:  Getting an infectious disease. Developing a transfusion reaction. This is an allergic reaction to something in the blood you were given. Every precaution is taken to prevent this. The decision to have a blood transfusion has been considered carefully by your caregiver before blood is given. Blood is not given unless the benefits outweigh the risks. AFTER THE TRANSFUSION  Right after receiving a blood transfusion, you will usually feel much better and more energetic. This is especially true if your red blood cells have gotten low (anemic). The transfusion raises the level of the red blood cells which carry oxygen, and this usually causes an energy increase. The nurse administering the transfusion will monitor you carefully for complications. HOME CARE INSTRUCTIONS  No special instructions are needed after a transfusion. You may find your energy is better. Speak with your caregiver about any limitations on activity for underlying diseases you may have. SEEK MEDICAL CARE IF:  Your condition is not improving after your transfusion. You develop redness or irritation at the intravenous (IV) site. SEEK IMMEDIATE MEDICAL CARE IF:  Any of the following symptoms occur over the next 12 hours: Shaking chills. You have a temperature by mouth above 102 F (38.9 C), not controlled by medicine. Chest, back, or muscle pain. People around you feel you are not acting correctly or are confused. Shortness of breath or difficulty breathing. Dizziness and fainting. You get a rash or develop hives. You have a decrease in urine output. Your urine turns a dark color or changes to pink, red, or brown. Any of the following symptoms occur over the next 10 days: You have a temperature by mouth above 102 F  (38.9 C), not controlled by medicine. Shortness of breath. Weakness after normal activity. The white part of the eye turns yellow (jaundice). You have a decrease in the amount of urine or are urinating less often. Your urine turns a dark color or changes to pink, red, or brown.

## 2023-04-20 ENCOUNTER — Encounter (HOSPITAL_COMMUNITY): Payer: Self-pay

## 2023-04-20 ENCOUNTER — Other Ambulatory Visit: Payer: Self-pay | Admitting: Gynecologic Oncology

## 2023-04-20 ENCOUNTER — Other Ambulatory Visit: Payer: Self-pay

## 2023-04-20 ENCOUNTER — Encounter (HOSPITAL_COMMUNITY)
Admission: RE | Admit: 2023-04-20 | Discharge: 2023-04-20 | Disposition: A | Discharge status: Home / Self Care | Payer: PPO | Source: Ambulatory Visit | Attending: Gynecologic Oncology | Admitting: Gynecologic Oncology

## 2023-04-20 ENCOUNTER — Telehealth: Payer: Self-pay | Admitting: *Deleted

## 2023-04-20 DIAGNOSIS — R519 Headache, unspecified: Secondary | ICD-10-CM | POA: Diagnosis not present

## 2023-04-20 DIAGNOSIS — Z86711 Personal history of pulmonary embolism: Secondary | ICD-10-CM

## 2023-04-20 DIAGNOSIS — R9431 Abnormal electrocardiogram [ECG] [EKG]: Secondary | ICD-10-CM | POA: Insufficient documentation

## 2023-04-20 DIAGNOSIS — F17200 Nicotine dependence, unspecified, uncomplicated: Secondary | ICD-10-CM | POA: Insufficient documentation

## 2023-04-20 DIAGNOSIS — Z9889 Other specified postprocedural states: Secondary | ICD-10-CM | POA: Insufficient documentation

## 2023-04-20 DIAGNOSIS — I10 Essential (primary) hypertension: Secondary | ICD-10-CM | POA: Diagnosis not present

## 2023-04-20 DIAGNOSIS — G35 Multiple sclerosis: Secondary | ICD-10-CM | POA: Insufficient documentation

## 2023-04-20 DIAGNOSIS — Z79899 Other long term (current) drug therapy: Secondary | ICD-10-CM | POA: Diagnosis not present

## 2023-04-20 DIAGNOSIS — C541 Malignant neoplasm of endometrium: Secondary | ICD-10-CM | POA: Diagnosis not present

## 2023-04-20 DIAGNOSIS — J449 Chronic obstructive pulmonary disease, unspecified: Secondary | ICD-10-CM | POA: Diagnosis not present

## 2023-04-20 DIAGNOSIS — Z01818 Encounter for other preprocedural examination: Secondary | ICD-10-CM | POA: Diagnosis not present

## 2023-04-20 DIAGNOSIS — Z01810 Encounter for preprocedural cardiovascular examination: Secondary | ICD-10-CM | POA: Diagnosis present

## 2023-04-20 DIAGNOSIS — Z01812 Encounter for preprocedural laboratory examination: Secondary | ICD-10-CM | POA: Diagnosis present

## 2023-04-20 HISTORY — DX: Myoneural disorder, unspecified: G70.9

## 2023-04-20 HISTORY — DX: Malignant (primary) neoplasm, unspecified: C80.1

## 2023-04-20 LAB — CBC
HCT: 41.1 % (ref 36.0–46.0)
Hemoglobin: 13.9 g/dL (ref 12.0–15.0)
MCH: 33.3 pg (ref 26.0–34.0)
MCHC: 33.8 g/dL (ref 30.0–36.0)
MCV: 98.3 fL (ref 80.0–100.0)
Platelets: 245 10*3/uL (ref 150–400)
RBC: 4.18 MIL/uL (ref 3.87–5.11)
RDW: 12.1 % (ref 11.5–15.5)
WBC: 6.4 10*3/uL (ref 4.0–10.5)
nRBC: 0 % (ref 0.0–0.2)

## 2023-04-20 LAB — COMPREHENSIVE METABOLIC PANEL
ALT: 20 U/L (ref 0–44)
AST: 21 U/L (ref 15–41)
Albumin: 4.5 g/dL (ref 3.5–5.0)
Alkaline Phosphatase: 55 U/L (ref 38–126)
Anion gap: 8 (ref 5–15)
BUN: 15 mg/dL (ref 8–23)
CO2: 27 mmol/L (ref 22–32)
Calcium: 9.5 mg/dL (ref 8.9–10.3)
Chloride: 102 mmol/L (ref 98–111)
Creatinine, Ser: 0.37 mg/dL — ABNORMAL LOW (ref 0.44–1.00)
GFR, Estimated: >60 mL/min (ref >60)
Glucose, Bld: 99 mg/dL (ref 70–99)
Potassium: 4.4 mmol/L (ref 3.5–5.1)
Sodium: 137 mmol/L (ref 135–145)
Total Bilirubin: 0.8 mg/dL (ref 0.0–1.2)
Total Protein: 7.6 g/dL (ref 6.5–8.1)

## 2023-04-20 MED ORDER — APIXABAN 2.5 MG PO TABS
2.5000 mg | ORAL_TABLET | Freq: Two times a day (BID) | ORAL | 0 refills | Status: DC
  Filled 2023-04-20: qty 28, 14d supply, fill #0

## 2023-04-20 NOTE — Telephone Encounter (Signed)
 Spoke with Joan [name] and relayed message from Eleanor Epps, NP that Dr. Viktoria discussed DVT prophylaxis after surgery for two weeks. Eleanor Epps, NP will send in 2 weeks of Eliquis  which is a pill to [facility]. Do NOT start this until we advise you after surgery. Pt verbalized understanding and also reminded patient that the office will call her on Thursday 1/9 for post op call. Pt thanked the office for calling.

## 2023-04-20 NOTE — Progress Notes (Signed)
 DISCUSSION: Joan [name] is a 68 yo female who presents to PAT prior to XI ROBOTIC ASSISTED TOTAL HYSTERECTOMY WITH BILATERAL SALPINGO OOPHORECTOMY; POSSIBLE OMENTECTOMY;POSSIBLE LAPAROTOMY with SENTINEL NODE BIOPSY and POSSIBLE LYMPH NODE DISSECTION on 04/21/23 with Dr. Viktoria. PMH of every day smoking, HTN, hx of PE, COPD (by CT scan), MS, headaches, endometrial cancer.  Prior anesthesia complications include PONV  Patient is followed by Neurology for hx of MS diagnosed in 2020. Last seen on 02/02/23. No exacerbations noted. She has stable right sided weakness. She takes dimethyl fumarate . She received neurology clearance (low risk in telephone note from 04/19/23).  Patient has an incidentally noted pancreatic lesion on CT A/P in November 2024. Noted by Dr. Viktoria and patient has a referral to GI. She will get repeat imaging in 6 months (~May 2025)  VS: BP (!) 115/94   Pulse 65   Temp 36.6 C (Oral)   Resp 16   Ht 5' 6 (1.676 m)   Wt 92.5 kg   SpO2 99%   BMI 32.93 kg/m   PROVIDERS: Shona Norleen PEDLAR, MD Neurology: Charlie Crete, MD  LABS: Labs reviewed: Acceptable for surgery. (all labs ordered are listed, but only abnormal results are displayed)  Labs Reviewed  COMPREHENSIVE METABOLIC PANEL - Abnormal; Notable for the following components:      Result Value   Creatinine, Ser 0.37 (*)    All other components within normal limits  CBC  CA 125  TYPE AND SCREEN     IMAGES:  CT abdomen/pelvis 03/05/23:  IMPRESSION: 1. No specific urinary tract abnormality is identified to explain the patient's hematuria. 2. Abnormal fluid density widening of the endometrial stripe 2.2 cm in diameter. Further investigation with dedicated pelvic sonography is recommended to exclude underlying malignancy. 3. Fluid density lesion in the head of the pancreas measuring 1.7 by 0.9 by 1.5 cm. Retrospectively stable and nonenhancing on MRI of 01/02/2022, establishing 1 year stability. Possibilities  include intraductal papillary mucinous neoplasm or postinflammatory cystic lesion. Follow up pancreatic protocol MRI with and without contrast is recommended in 6 months time 4 standard surveillance. Alternatively, endoscopic ultrasound with fine-needle aspiration could be performed. This recommendation follows ACR consensus guidelines: Management of Incidental Pancreatic Cysts: A White Paper of the ACR Incidental Findings Committee. J Am Coll Radiol 2017;14:911-923. 4. Aortic atherosclerosis. 5. Lower thoracic and lumbar spondylosis and degenerative disc disease.  Aortic Atherosclerosis (ICD10-I70.0).  CT Chest 12/31/22:  IMPRESSION: 1. Lung-RADS 2, benign appearance or behavior. Continue annual screening with low-dose chest CT without contrast in 12 months. 2. Mild coronary artery calcifications and aortic Atherosclerosis (ICD10-I70.0).     EKG 04/20/23:  Normal sinus rhythm, rate 74 Low voltage QRS Left anterior fascicular block Possible Anterolateral infarct , age undetermined  CV:  Past Medical History:  Diagnosis Date   Cancer [facility])    Endometrial   Colitis, ischemic (HCC) 2003   Headaches, cluster    Hypercholesterolemia    Hypertension    Insomnia    Multiple sclerosis (HCC) 05/29/2018   Neuromuscular disorder (HCC)    Multiple Sclerosis   PE (pulmonary embolism)    ?birth control, in 61s   PONV (postoperative nausea and vomiting)    S/P colonoscopy 7995,7992   2004: tubular adeboma, 2007: hyperplastic polyps. Due 2012   Seasonal allergies    Tobacco abuse    Tubular adenoma 2004   colonoscopy 2004    Past Surgical History:  Procedure Laterality Date   BREAST BIOPSY Left    benign  COLONOSCOPY  09/17/05   20 cm polyp removed/bx/middescending colon polyp removed with snare   COLONOSCOPY  2003   tubular adenoma removed from cecum    COLONOSCOPY  03/31/2011   RMR, diverticulosis, multiple colon polyps removed (tubular adenomas). next TCS 03/2016    COLONOSCOPY N/A 10/22/2015   Procedure: COLONOSCOPY;  Surgeon: Lamar CHRISTELLA Hollingshead, MD;  [facility]: [facility];  Service: Endoscopy;  Laterality: N/A;  730   COLONOSCOPY N/A 02/05/2021   Procedure: COLONOSCOPY;  Surgeon: Hollingshead Lamar CHRISTELLA, MD;  Location: [facility];  Service: Endoscopy;  Laterality: N/A;  ASA II / 9:30   ESOPHAGOGASTRODUODENOSCOPY N/A 02/15/2015   RMR, mild reflux esophagitis. empirical dilation of esophagus.    MALONEY DILATION N/A 02/15/2015   Procedure: AGAPITO DILATION;  Surgeon: Lamar CHRISTELLA Hollingshead, MD;  Location: [facility];  Service: Endoscopy;  Laterality: N/A;   POLYPECTOMY  02/05/2021   Procedure: POLYPECTOMY;  Surgeon: Hollingshead Lamar CHRISTELLA, MD;  Location: [facility];  Service: Endoscopy;;   TUBAL LIGATION      MEDICATIONS:  acetaminophen  (TYLENOL ) 500 MG tablet   Cholecalciferol  (VITAMIN D ) 50 MCG (2000 UT) tablet   Dimethyl Fumarate  240 MG CPDR   rosuvastatin  (CRESTOR ) 10 MG tablet   senna-docusate (SENOKOT-S) 8.6-50 MG tablet   traMADol  (ULTRAM ) 50 MG tablet   valsartan -hydrochlorothiazide  (DIOVAN -HCT) 80-12.5 MG tablet   No current facility-administered medications for this encounter.   Burnard CHRISTELLA Odis DEVONNA [facility] Surgical Short Stay/Anesthesiology [facility] Phone [phone] 04/20/2023 11:41 AM

## 2023-04-20 NOTE — Anesthesia Preprocedure Evaluation (Addendum)
 Anesthesia Evaluation  Patient identified by MRN, date of birth, ID band Patient awake    Reviewed: Allergy & Precautions, NPO status , Patient's Chart, lab work & pertinent test results  History of Anesthesia Complications (+) PONV and history of anesthetic complications  Airway Mallampati: II  TM Distance: >3 FB Neck ROM: Full    Dental no notable dental hx. (+) Teeth Intact, Dental Advisory Given   Pulmonary Current Smoker and Patient abstained from smoking., PE   Pulmonary exam normal breath sounds clear to auscultation       Cardiovascular hypertension, Pt. on medications Normal cardiovascular exam Rhythm:Regular Rate:Normal     Neuro/Psych  Headaches  Neuromuscular disease (MS, right sided weakness, stable)  negative psych ROS   GI/Hepatic negative GI ROS, Neg liver ROS,,,  Endo/Other  negative endocrine ROS    Renal/GU negative Renal ROS  negative genitourinary   Musculoskeletal  (+) Arthritis ,    Abdominal   Peds  Hematology negative hematology ROS (+)   Anesthesia Other Findings   Reproductive/Obstetrics                             Anesthesia Physical Anesthesia Plan  ASA: 3  Anesthesia Plan: General   Post-op Pain Management: Tylenol  PO (pre-op)* and Ketamine  IV*   Induction: Intravenous  PONV Risk Score and Plan: 3 and Midazolam , Dexamethasone  and Ondansetron   Airway Management Planned: Oral ETT  Additional Equipment:   Intra-op Plan:   Post-operative Plan: Extubation in OR  Informed Consent: I have reviewed the patients History and Physical, chart, labs and discussed the procedure including the risks, benefits and alternatives for the proposed anesthesia with the patient or authorized representative who has indicated his/her understanding and acceptance.     Dental advisory given  Plan Discussed with: CRNA  Anesthesia Plan Comments: (See PAT note from 1/7  by MARLA Senna PA-C  2 IVs)        Anesthesia Quick Evaluation

## 2023-04-20 NOTE — Telephone Encounter (Signed)
 Telephone call to check on pre-operative status.  Patient compliant with pre-operative instructions.  Reinforced nothing to eat after midnight. Clear liquids until 0515. Patient to arrive at 0615.  No questions or concerns voiced.  Instructed to call for any needs.

## 2023-04-21 ENCOUNTER — Other Ambulatory Visit: Payer: Self-pay

## 2023-04-21 ENCOUNTER — Ambulatory Visit (HOSPITAL_COMMUNITY)
Admission: RE | Admit: 2023-04-21 | Discharge: 2023-04-22 | Disposition: A | Discharge status: Home / Self Care | Payer: PPO | Attending: Gynecologic Oncology | Admitting: Gynecologic Oncology

## 2023-04-21 ENCOUNTER — Ambulatory Visit (HOSPITAL_BASED_OUTPATIENT_CLINIC_OR_DEPARTMENT_OTHER): Payer: PPO

## 2023-04-21 ENCOUNTER — Ambulatory Visit (HOSPITAL_COMMUNITY): Payer: Self-pay | Admitting: Medical

## 2023-04-21 ENCOUNTER — Encounter (HOSPITAL_COMMUNITY)
Admission: RE | Disposition: A | Discharge status: Home / Self Care | Payer: Self-pay | Source: Home / Self Care | Attending: Gynecologic Oncology

## 2023-04-21 ENCOUNTER — Encounter (HOSPITAL_COMMUNITY): Payer: Self-pay | Admitting: Gynecologic Oncology

## 2023-04-21 DIAGNOSIS — I10 Essential (primary) hypertension: Secondary | ICD-10-CM

## 2023-04-21 DIAGNOSIS — G35 Multiple sclerosis: Secondary | ICD-10-CM | POA: Insufficient documentation

## 2023-04-21 DIAGNOSIS — E785 Hyperlipidemia, unspecified: Secondary | ICD-10-CM

## 2023-04-21 DIAGNOSIS — C541 Malignant neoplasm of endometrium: Secondary | ICD-10-CM | POA: Insufficient documentation

## 2023-04-21 DIAGNOSIS — Z86711 Personal history of pulmonary embolism: Secondary | ICD-10-CM | POA: Diagnosis not present

## 2023-04-21 DIAGNOSIS — F17200 Nicotine dependence, unspecified, uncomplicated: Secondary | ICD-10-CM | POA: Insufficient documentation

## 2023-04-21 DIAGNOSIS — N838 Other noninflammatory disorders of ovary, fallopian tube and broad ligament: Secondary | ICD-10-CM | POA: Diagnosis not present

## 2023-04-21 DIAGNOSIS — C7982 Secondary malignant neoplasm of genital organs: Secondary | ICD-10-CM | POA: Diagnosis not present

## 2023-04-21 DIAGNOSIS — C539 Malignant neoplasm of cervix uteri, unspecified: Secondary | ICD-10-CM | POA: Diagnosis not present

## 2023-04-21 DIAGNOSIS — D069 Carcinoma in situ of cervix, unspecified: Secondary | ICD-10-CM | POA: Diagnosis not present

## 2023-04-21 DIAGNOSIS — F17210 Nicotine dependence, cigarettes, uncomplicated: Secondary | ICD-10-CM | POA: Diagnosis not present

## 2023-04-21 HISTORY — PX: ROBOTIC ASSISTED TOTAL HYSTERECTOMY WITH BILATERAL SALPINGO OOPHERECTOMY: SHX6086

## 2023-04-21 HISTORY — PX: SENTINEL NODE BIOPSY: SHX6608

## 2023-04-21 HISTORY — PX: LYMPH NODE DISSECTION: SHX5087

## 2023-04-21 LAB — ABO/RH: ABO/RH(D): O POS

## 2023-04-21 LAB — CA 125: Cancer Antigen (CA) 125: 17.8 U/mL (ref 0.0–38.1)

## 2023-04-21 LAB — TYPE AND SCREEN
ABO/RH(D): O POS
Antibody Screen: NEGATIVE

## 2023-04-21 SURGERY — HYSTERECTOMY, TOTAL, ROBOT-ASSISTED, LAPAROSCOPIC, WITH BILATERAL SALPINGO-OOPHORECTOMY
Anesthesia: General

## 2023-04-21 MED ORDER — TRAMADOL HCL 50 MG PO TABS
50.0000 mg | ORAL_TABLET | Freq: Four times a day (QID) | ORAL | Status: DC | PRN
  Administered 2023-04-21 – 2023-04-22 (×3): 50 mg via ORAL
  Filled 2023-04-21 (×2): qty 1

## 2023-04-21 MED ORDER — ROSUVASTATIN CALCIUM 10 MG PO TABS
10.0000 mg | ORAL_TABLET | Freq: Every day | ORAL | Status: DC
Start: 2023-04-21 — End: 2023-04-22
  Administered 2023-04-21 – 2023-04-22 (×2): 10 mg via ORAL
  Filled 2023-04-21 (×2): qty 1

## 2023-04-21 MED ORDER — LIDOCAINE HCL (CARDIAC) PF 100 MG/5ML IV SOSY
PREFILLED_SYRINGE | INTRAVENOUS | Status: DC | PRN
  Administered 2023-04-21: 80 mg via INTRAVENOUS

## 2023-04-21 MED ORDER — PHENYLEPHRINE HCL-NACL 20-0.9 MG/250ML-% IV SOLN
INTRAVENOUS | Status: AC
  Filled 2023-04-21: qty 250

## 2023-04-21 MED ORDER — FENTANYL CITRATE PF 50 MCG/ML IJ SOSY
25.0000 ug | PREFILLED_SYRINGE | INTRAMUSCULAR | Status: DC | PRN
  Administered 2023-04-21 (×2): 50 ug via INTRAVENOUS

## 2023-04-21 MED ORDER — LIDOCAINE HCL (PF) 2 % IJ SOLN
INTRAMUSCULAR | Status: DC | PRN
  Administered 2023-04-21: 1.5 mg/kg/h via INTRADERMAL

## 2023-04-21 MED ORDER — ONDANSETRON HCL 4 MG/2ML IJ SOLN
4.0000 mg | Freq: Four times a day (QID) | INTRAMUSCULAR | Status: DC | PRN
Start: 2023-04-21 — End: 2023-04-22

## 2023-04-21 MED ORDER — SODIUM CHLORIDE 0.9 % IV SOLN: INTRAVENOUS | Status: DC | PRN

## 2023-04-21 MED ORDER — DEXAMETHASONE SODIUM PHOSPHATE 10 MG/ML IJ SOLN
INTRAMUSCULAR | Status: AC
  Filled 2023-04-21: qty 1

## 2023-04-21 MED ORDER — KETAMINE HCL 50 MG/5ML IJ SOSY
PREFILLED_SYRINGE | INTRAMUSCULAR | Status: AC
Start: 2023-04-21 — End: ?
  Filled 2023-04-21: qty 5

## 2023-04-21 MED ORDER — CEFAZOLIN SODIUM-DEXTROSE 2-4 GM/100ML-% IV SOLN
2.0000 g | INTRAVENOUS | Status: AC
  Administered 2023-04-21: 2 g via INTRAVENOUS
  Filled 2023-04-21: qty 100

## 2023-04-21 MED ORDER — PHENYLEPHRINE 80 MCG/ML (10ML) SYRINGE FOR IV PUSH (FOR BLOOD PRESSURE SUPPORT)
PREFILLED_SYRINGE | INTRAVENOUS | Status: AC
  Filled 2023-04-21: qty 10

## 2023-04-21 MED ORDER — STERILE WATER FOR INJECTION IJ SOLN
INTRAMUSCULAR | Status: DC | PRN
  Administered 2023-04-21: 1 mL via INTRAUTERINE

## 2023-04-21 MED ORDER — FENTANYL CITRATE (PF) 100 MCG/2ML IJ SOLN
INTRAMUSCULAR | Status: DC | PRN
  Administered 2023-04-21 (×3): 50 ug via INTRAVENOUS
  Administered 2023-04-21: 25 ug via INTRAVENOUS

## 2023-04-21 MED ORDER — ACETAMINOPHEN 500 MG PO TABS
1000.0000 mg | ORAL_TABLET | Freq: Four times a day (QID) | ORAL | Status: DC
  Administered 2023-04-21 – 2023-04-22 (×3): 1000 mg via ORAL
  Filled 2023-04-21 (×3): qty 2

## 2023-04-21 MED ORDER — FENTANYL CITRATE PF 50 MCG/ML IJ SOSY
PREFILLED_SYRINGE | INTRAMUSCULAR | Status: AC
  Filled 2023-04-21: qty 2

## 2023-04-21 MED ORDER — ORAL CARE MOUTH RINSE: 15.0000 mL | Freq: Once | OROMUCOSAL | Status: AC

## 2023-04-21 MED ORDER — ONDANSETRON HCL 4 MG/2ML IJ SOLN
INTRAMUSCULAR | Status: DC | PRN
  Administered 2023-04-21: 4 mg via INTRAVENOUS

## 2023-04-21 MED ORDER — DEXTROSE-SODIUM CHLORIDE 5-0.45 % IV SOLN: INTRAVENOUS | Status: DC

## 2023-04-21 MED ORDER — FENTANYL CITRATE (PF) 250 MCG/5ML IJ SOLN
INTRAMUSCULAR | Status: AC
  Filled 2023-04-21: qty 5

## 2023-04-21 MED ORDER — ROCURONIUM BROMIDE 10 MG/ML (PF) SYRINGE
PREFILLED_SYRINGE | INTRAVENOUS | Status: AC
Start: 2023-04-21 — End: ?
  Filled 2023-04-21: qty 10

## 2023-04-21 MED ORDER — HYDROMORPHONE HCL 1 MG/ML IJ SOLN
0.5000 mg | INTRAMUSCULAR | Status: DC | PRN
Start: 2023-04-21 — End: 2023-04-22

## 2023-04-21 MED ORDER — MIDAZOLAM HCL 2 MG/2ML IJ SOLN
INTRAMUSCULAR | Status: AC
Start: 2023-04-21 — End: ?
  Filled 2023-04-21: qty 2

## 2023-04-21 MED ORDER — ACETAMINOPHEN 500 MG PO TABS
1000.0000 mg | ORAL_TABLET | ORAL | Status: AC
  Administered 2023-04-21: 1000 mg via ORAL
  Filled 2023-04-21: qty 2

## 2023-04-21 MED ORDER — STERILE WATER FOR INJECTION IJ SOLN
INTRAMUSCULAR | Status: AC
  Filled 2023-04-21: qty 10

## 2023-04-21 MED ORDER — OXYCODONE HCL 5 MG PO TABS: 5.0000 mg | ORAL_TABLET | ORAL | Status: DC | PRN

## 2023-04-21 MED ORDER — PHENYLEPHRINE 80 MCG/ML (10ML) SYRINGE FOR IV PUSH (FOR BLOOD PRESSURE SUPPORT)
PREFILLED_SYRINGE | INTRAVENOUS | Status: DC | PRN
  Administered 2023-04-21: 80 ug via INTRAVENOUS

## 2023-04-21 MED ORDER — MIDAZOLAM HCL 5 MG/5ML IJ SOLN
INTRAMUSCULAR | Status: DC | PRN
  Administered 2023-04-21: 2 mg via INTRAVENOUS

## 2023-04-21 MED ORDER — SUGAMMADEX SODIUM 200 MG/2ML IV SOLN
INTRAVENOUS | Status: DC | PRN
  Administered 2023-04-21: 200 mg via INTRAVENOUS

## 2023-04-21 MED ORDER — ONDANSETRON HCL 4 MG/2ML IJ SOLN
INTRAMUSCULAR | Status: AC
  Filled 2023-04-21: qty 2

## 2023-04-21 MED ORDER — BUPIVACAINE HCL 0.25 % IJ SOLN
INTRAMUSCULAR | Status: DC | PRN
  Administered 2023-04-21: 30 mL

## 2023-04-21 MED ORDER — KETAMINE HCL 50 MG/5ML IJ SOSY
PREFILLED_SYRINGE | INTRAMUSCULAR | Status: DC | PRN
  Administered 2023-04-21: 30 mg via INTRAVENOUS
  Administered 2023-04-21 (×2): 10 mg via INTRAVENOUS

## 2023-04-21 MED ORDER — METRONIDAZOLE 500 MG/100ML IV SOLN
500.0000 mg | INTRAVENOUS | Status: AC
Start: 2023-04-21 — End: 2023-04-21
  Administered 2023-04-21: 500 mg via INTRAVENOUS
  Filled 2023-04-21: qty 100

## 2023-04-21 MED ORDER — PROPOFOL 10 MG/ML IV BOLUS
INTRAVENOUS | Status: AC
  Filled 2023-04-21: qty 20

## 2023-04-21 MED ORDER — STERILE WATER FOR INJECTION IJ SOLN
INTRAMUSCULAR | Status: DC | PRN
  Administered 2023-04-21: 1 mL via INTRAMUSCULAR

## 2023-04-21 MED ORDER — ROCURONIUM BROMIDE 100 MG/10ML IV SOLN
INTRAVENOUS | Status: DC | PRN
  Administered 2023-04-21: 50 mg via INTRAVENOUS
  Administered 2023-04-21: 30 mg via INTRAVENOUS
  Administered 2023-04-21: 20 mg via INTRAVENOUS
  Administered 2023-04-21: 10 mg via INTRAVENOUS
  Administered 2023-04-21: 20 mg via INTRAVENOUS

## 2023-04-21 MED ORDER — TRAMADOL HCL 50 MG PO TABS
ORAL_TABLET | ORAL | Status: AC
  Filled 2023-04-21: qty 1

## 2023-04-21 MED ORDER — HEPARIN SODIUM (PORCINE) 5000 UNIT/ML IJ SOLN
5000.0000 [IU] | INTRAMUSCULAR | Status: AC
  Administered 2023-04-21: 5000 [IU] via SUBCUTANEOUS
  Filled 2023-04-21: qty 1

## 2023-04-21 MED ORDER — ROCURONIUM BROMIDE 10 MG/ML (PF) SYRINGE
PREFILLED_SYRINGE | INTRAVENOUS | Status: AC
  Filled 2023-04-21: qty 10

## 2023-04-21 MED ORDER — DEXAMETHASONE SODIUM PHOSPHATE 4 MG/ML IJ SOLN
4.0000 mg | INTRAMUSCULAR | Status: AC
  Administered 2023-04-21: 5 mg via INTRAVENOUS

## 2023-04-21 MED ORDER — ONDANSETRON HCL 4 MG PO TABS
4.0000 mg | ORAL_TABLET | Freq: Four times a day (QID) | ORAL | Status: DC | PRN
Start: 2023-04-21 — End: 2023-04-22

## 2023-04-21 MED ORDER — CHLORHEXIDINE GLUCONATE 0.12 % MT SOLN
15.0000 mL | Freq: Once | OROMUCOSAL | Status: AC
Start: 2023-04-21 — End: 2023-04-21
  Administered 2023-04-21: 15 mL via OROMUCOSAL

## 2023-04-21 MED ORDER — PHENYLEPHRINE HCL-NACL 20-0.9 MG/250ML-% IV SOLN
INTRAVENOUS | Status: DC | PRN
  Administered 2023-04-21: 20 ug/min via INTRAVENOUS

## 2023-04-21 MED ORDER — SENNOSIDES-DOCUSATE SODIUM 8.6-50 MG PO TABS
2.0000 | ORAL_TABLET | Freq: Every day | ORAL | Status: DC
  Administered 2023-04-21: 2 via ORAL
  Filled 2023-04-21: qty 2

## 2023-04-21 MED ORDER — STERILE WATER FOR IRRIGATION IR SOLN
Status: DC | PRN
  Administered 2023-04-21: 1000 mL

## 2023-04-21 MED ORDER — DIMETHYL FUMARATE 240 MG PO CPDR
1.0000 | DELAYED_RELEASE_CAPSULE | Freq: Two times a day (BID) | ORAL | Status: DC
Start: 2023-04-21 — End: 2023-04-22

## 2023-04-21 MED ORDER — LACTATED RINGERS IR SOLN
Status: DC | PRN
  Administered 2023-04-21: 1000 mL

## 2023-04-21 MED ORDER — LIDOCAINE HCL (PF) 2 % IJ SOLN
INTRAMUSCULAR | Status: AC
Start: 2023-04-21 — End: ?
  Filled 2023-04-21: qty 5

## 2023-04-21 MED ORDER — LIDOCAINE HCL 2 % IJ SOLN
INTRAMUSCULAR | Status: AC
  Filled 2023-04-21: qty 20

## 2023-04-21 MED ORDER — BUPIVACAINE HCL 0.25 % IJ SOLN
INTRAMUSCULAR | Status: AC
  Filled 2023-04-21: qty 1

## 2023-04-21 MED ORDER — LACTATED RINGERS IV SOLN: INTRAVENOUS | Status: DC

## 2023-04-21 MED ORDER — ACETAMINOPHEN 500 MG PO TABS: 1000.0000 mg | ORAL_TABLET | Freq: Once | ORAL | Status: DC

## 2023-04-21 MED ORDER — ENOXAPARIN SODIUM 40 MG/0.4ML IJ SOSY
40.0000 mg | PREFILLED_SYRINGE | INTRAMUSCULAR | Status: DC
  Administered 2023-04-22: 40 mg via SUBCUTANEOUS
  Filled 2023-04-21 (×2): qty 0.4

## 2023-04-21 MED ORDER — PROPOFOL 10 MG/ML IV BOLUS
INTRAVENOUS | Status: DC | PRN
  Administered 2023-04-21: 100 mg via INTRAVENOUS

## 2023-04-21 SURGICAL SUPPLY — 72 items
APPLICATOR ARISTA FLEXITIP XL (MISCELLANEOUS) IMPLANT
APPLICATOR SURGIFLO ENDO (HEMOSTASIS) IMPLANT
BAG COUNTER SPONGE SURGICOUNT (BAG) IMPLANT
BAG LAPAROSCOPIC 12 15 PORT 16 (BASKET) IMPLANT
BAG RETRIEVAL 12/15 (BASKET) ×2
BLADE SURG SZ10 CARB STEEL (BLADE) IMPLANT
COVER BACK TABLE 60X90IN (DRAPES) ×2 IMPLANT
COVER TIP SHEARS 8 DVNC (MISCELLANEOUS) ×2 IMPLANT
DERMABOND ADVANCED .7 DNX12 (GAUZE/BANDAGES/DRESSINGS) ×2 IMPLANT
DRAPE ARM DVNC X/XI (DISPOSABLE) ×8 IMPLANT
DRAPE COLUMN DVNC XI (DISPOSABLE) ×2 IMPLANT
DRAPE SHEET LG 3/4 BI-LAMINATE (DRAPES) ×2 IMPLANT
DRAPE SURG IRRIG POUCH 19X23 (DRAPES) ×2 IMPLANT
DRIVER NDL MEGA SUTCUT DVNCXI (INSTRUMENTS) ×2 IMPLANT
DRIVER NDLE MEGA SUTCUT DVNCXI (INSTRUMENTS) ×2
DRSG OPSITE POSTOP 4X6 (GAUZE/BANDAGES/DRESSINGS) IMPLANT
DRSG OPSITE POSTOP 4X8 (GAUZE/BANDAGES/DRESSINGS) IMPLANT
ELECT PENCIL ROCKER SW 15FT (MISCELLANEOUS) IMPLANT
ELECT REM PT RETURN 15FT ADLT (MISCELLANEOUS) ×2 IMPLANT
FORCEPS BPLR FENES DVNC XI (FORCEP) ×2 IMPLANT
FORCEPS PROGRASP DVNC XI (FORCEP) ×2 IMPLANT
GAUZE 4X4 16PLY ~~LOC~~+RFID DBL (SPONGE) ×4 IMPLANT
GLOVE BIO SURGEON STRL SZ 6 (GLOVE) ×8 IMPLANT
GLOVE BIO SURGEON STRL SZ 6.5 (GLOVE) ×2 IMPLANT
GOWN STRL REUS W/ TWL LRG LVL3 (GOWN DISPOSABLE) ×8 IMPLANT
GRASPER SUT TROCAR 14GX15 (MISCELLANEOUS) IMPLANT
HEMOSTAT ARISTA ABSORB 3G PWDR (HEMOSTASIS) IMPLANT
HOLDER FOLEY CATH W/STRAP (MISCELLANEOUS) IMPLANT
IRRIG SUCT STRYKERFLOW 2 WTIP (MISCELLANEOUS) ×2
IRRIGATION SUCT STRKRFLW 2 WTP (MISCELLANEOUS) ×2 IMPLANT
KIT PROCEDURE DVNC SI (MISCELLANEOUS) IMPLANT
KIT TURNOVER KIT A (KITS) IMPLANT
LIGASURE IMPACT 36 18CM CVD LR (INSTRUMENTS) IMPLANT
MANIPULATOR ADVINCU DEL 3.0 PL (MISCELLANEOUS) IMPLANT
MANIPULATOR ADVINCU DEL 3.5 PL (MISCELLANEOUS) IMPLANT
MANIPULATOR UTERINE 4.5 ZUMI (MISCELLANEOUS) IMPLANT
NDL HYPO 21X1.5 SAFETY (NEEDLE) ×2 IMPLANT
NDL SPNL 18GX3.5 QUINCKE PK (NEEDLE) IMPLANT
NEEDLE HYPO 21X1.5 SAFETY (NEEDLE) ×2
NEEDLE SPNL 18GX3.5 QUINCKE PK (NEEDLE) ×2
OBTURATOR OPTICAL STND 8 DVNC (TROCAR) ×2
OBTURATOR OPTICALSTD 8 DVNC (TROCAR) ×2 IMPLANT
PACK ROBOT GYN CUSTOM WL (TRAY / TRAY PROCEDURE) ×2 IMPLANT
PAD POSITIONING PINK XL (MISCELLANEOUS) ×2 IMPLANT
PORT ACCESS TROCAR AIRSEAL 12 (TROCAR) IMPLANT
SCISSORS MNPLR CVD DVNC XI (INSTRUMENTS) ×2 IMPLANT
SCRUB CHG 4% DYNA-HEX 4OZ (MISCELLANEOUS) IMPLANT
SEAL UNIV 5-12 XI (MISCELLANEOUS) ×8 IMPLANT
SEALER VESSEL EXT DVNC XI (MISCELLANEOUS) IMPLANT
SET TRI-LUMEN FLTR TB AIRSEAL (TUBING) ×2 IMPLANT
SPIKE FLUID TRANSFER (MISCELLANEOUS) ×2 IMPLANT
SPONGE T-LAP 18X18 ~~LOC~~+RFID (SPONGE) IMPLANT
SURGIFLO W/THROMBIN 8M KIT (HEMOSTASIS) IMPLANT
SUT MNCRL AB 4-0 PS2 18 (SUTURE) IMPLANT
SUT PDS AB 1 TP1 96 (SUTURE) IMPLANT
SUT V-LOC 180 0-0 GS22 (SUTURE) IMPLANT
SUT VIC AB 0 CT1 27XBRD ANTBC (SUTURE) IMPLANT
SUT VIC AB 2-0 CT1 TAPERPNT 27 (SUTURE) IMPLANT
SUT VIC AB 4-0 PS2 18 (SUTURE) ×4 IMPLANT
SUT VICRYL 0 27 CT2 27 ABS (SUTURE) ×2 IMPLANT
SUT VLOC 180 0 9IN GS21 (SUTURE) IMPLANT
SYR 10ML LL (SYRINGE) IMPLANT
SYS BAG RETRIEVAL 10MM (BASKET) ×2
SYS WOUND ALEXIS 18CM MED (MISCELLANEOUS)
SYSTEM BAG RETRIEVAL 10MM (BASKET) IMPLANT
SYSTEM WOUND ALEXIS 18CM MED (MISCELLANEOUS) IMPLANT
TRAP SPECIMEN MUCUS 40CC (MISCELLANEOUS) IMPLANT
TRAY FOLEY MTR SLVR 16FR STAT (SET/KITS/TRAYS/PACK) ×2 IMPLANT
TROCAR PORT AIRSEAL 5X120 (TROCAR) IMPLANT
UNDERPAD 30X36 HEAVY ABSORB (UNDERPADS AND DIAPERS) ×4 IMPLANT
WATER STERILE IRR 1000ML POUR (IV SOLUTION) ×2 IMPLANT
YANKAUER SUCT BULB TIP 10FT TU (MISCELLANEOUS) IMPLANT

## 2023-04-21 NOTE — Transfer of Care (Signed)
 Immediate Anesthesia Transfer of Care Note  Patient: Joan [name]  Procedure(s) Performed: XI ROBOTIC ASSISTED TOTAL HYSTERECTOMY WITH BILATERAL SALPINGO OOPHORECTOMY; OMENTECTOMY (Bilateral) SENTINEL NODE BIOPSY LYMPH NODE DISSECTION  Patient Location: PACU  Anesthesia Type:General  Level of Consciousness: awake  Airway & Oxygen Therapy: Patient Spontanous Breathing and Patient connected to nasal cannula oxygen  Post-op Assessment: Report given to RN and Post -op Vital signs reviewed and stable  Post vital signs: Reviewed and stable  Last Vitals:  Vitals Value Taken Time  BP 111/62 04/21/23 1209  Temp    Pulse 84 04/21/23 1216  Resp 15 04/21/23 1216  SpO2 97 % 04/21/23 1216  Vitals shown include unfiled device data.  Last Pain:  Vitals:   04/21/23 0709  TempSrc:   PainSc: 0-No pain         Complications: No notable events documented.

## 2023-04-21 NOTE — Interval H&P Note (Signed)
 History and Physical Interval Note:  04/21/2023 7:02 AM  Joan [name]  has presented today for surgery, with the diagnosis of ENDOMETRIAL CANCER.  The various methods of treatment have been discussed with the patient and family. After consideration of risks, benefits and other options for treatment, the patient has consented to  Procedure(s): XI ROBOTIC ASSISTED TOTAL HYSTERECTOMY WITH BILATERAL SALPINGO OOPHORECTOMY; POSSIBLE OMENTECTOMY;POSSIBLE LAPAROTOMY (Bilateral) SENTINEL NODE BIOPSY (N/A) POSSIBLE LYMPH NODE DISSECTION (N/A) as a surgical intervention.  The patient's history has been reviewed, patient examined, no change in status, stable for surgery.  I have reviewed the patient's chart and labs.  Questions were answered to the patient's satisfaction.     Comer JONELLE Dollar

## 2023-04-21 NOTE — Op Note (Signed)
 OPERATIVE NOTE  Pre-operative Diagnosis: endometrial cancer, high grade  Post-operative Diagnosis: same  Operation: Robotic-assisted laparoscopic total hysterectomy with bilateral salpingoophorectomy, SLN biopsy, selective right obturator lymphadenectomy, omentectomy   Surgeon: Viktoria Crank MD  Assistant Surgeon: Micheline Setter (an NP assistant was necessary for tissue manipulation, management of robotic instrumentation, retraction and positioning due to the complexity of the case and hospital policies).   Anesthesia: GET  Urine Output: 125 cc  Operative Findings: On EUA, 10 cm mobile uterus. On intra-abdominal entry, normal upper abdominal survey. Normal appearing although thick omentum. Normal small and large bowel. Uterus 10 cm and normal in appearance. Some paratubal and para-ovarian cystic lesions (all less than 5 mm). NO ascites. Mapping successful to bilateral deep obturator SLNs. On the right, there were several prominent obturator LNs that were removed after identification and removal of the right SLN.  Estimated Blood Loss:  75 cc      Total IV Fluids: see I&O flowsheet         Specimens: uterus, cervix, bilateral tubes and ovaries, bilateral obturator SLNs, right prominent obturator SLNs, omentum, pelvic washings         Complications:  None apparent; patient tolerated the procedure well.         Disposition: PACU - hemodynamically stable.  Procedure Details  The patient was seen in the Holding Room. The risks, benefits, complications, treatment options, and expected outcomes were discussed with the patient.  The patient concurred with the proposed plan, giving informed consent.  The site of surgery properly noted/marked. The patient was identified as Joan [name] and the procedure verified as a Robotic-assisted hysterectomy with bilateral salpingo oophorectomy with SLN biopsy.   After induction of anesthesia, the patient was draped and prepped in the usual sterile  manner. Patient was placed in supine position after anesthesia and draped and prepped in the usual sterile manner as follows: Her arms were tucked to her side with all appropriate precautions.  The patient was secured to the bed using padding and tape across her chest.  The patient was placed in the semi-lithotomy position in [place] stirrups.  The perineum and vagina were prepped with CHG. The patient's abdomen was prepped with ChloraPrep and she was draped after the prep had been allowed to dry for 3 minutes.  A Time Out was held and the above information confirmed.  The urethra was prepped with Betadine. Foley catheter was placed.  A sterile speculum was placed in the vagina.  The cervix was grasped with a single-tooth tenaculum. 2mg  total of ICG was injected into the cervical stroma at 2 and 9 o'clock with 1cc injected at a 1cm and 2mm depth (concentration 0.5mg /ml) in all locations. The cervix was dilated with Fredirick dilators.  The ZUMI uterine manipulator with a medium colpotomizer ring was placed without difficulty.  A pneum occluder balloon was placed over the manipulator.  OG tube placement was confirmed and to suction.   Next, a 10 mm skin incision was made 1 cm below the subcostal margin in the midclavicular line.  The 5 mm Optiview port and scope was used for direct entry.  Opening pressure was under 10 mm CO2.  The abdomen was insufflated and the findings were noted as above.   At this point and all points during the procedure, the patient's intra-abdominal pressure did not exceed 15 mmHg. Next, an 8 mm skin incision was made superior to the umbilicus and a right and left port were placed about 8 cm lateral to the  robot port on the right and left side.  A fourth arm was placed on the right.  The 5 mm assist trocar was exchanged for a 10-12 mm port. All ports were placed under direct visualization.  The patient was placed in steep Trendelenburg.  Bowel was folded away into the upper abdomen.  The robot  was docked in the normal manner.  The right and left peritoneum were opened parallel to the IP ligament to open the retroperitoneal spaces bilaterally. The round ligaments were transected. The SLN mapping was performed in bilateral pelvic basins. After identifying the ureters, the para rectal and paravesical spaces were opened up entirely with careful dissection below the level of the ureters bilaterally and to the depth of the uterine artery origin in order to skeletonize the uterine web and ensure visualization of all parametrial channels. The para-aortic basins were carefully exposed and evaluated for isolated para-aortic SLN's. Lymphatic channels were identified travelling to the following visualized sentinel lymph node's: bilateral obturator SLNs. These SLN's were separated from their surrounding lymphatic tissue, removed and sent for permanent pathology.  On the right, there were several prominent lymph nodes within the obturator space. These lymph nodes were removed after carefully identifying the obturator nerve along its course.   The hysterectomy was started.  The ureter was again noted to be on the medial leaf of the broad ligament.  The peritoneum above the ureter was incised and stretched and the infundibulopelvic ligament was skeletonized, cauterized and cut.  The posterior peritoneum was taken down to the level of the KOH ring.  The anterior peritoneum was also taken down.  The bladder flap was created to the level of the KOH ring.  The uterine artery on the right side was skeletonized, cauterized and cut in the normal manner.  A similar procedure was performed on the left.  The colpotomy was made and the uterus, cervix, bilateral ovaries and tubes were amputated and delivered through the vagina.  Pedicles were inspected and excellent hemostasis was achieved.    One robot instrument was removed and table motion was used to flatten the patient. A large infracolic omental biopsy was then  performed after identification of the transverse colon. This was performed with bipolar electrocautery using the vessel sealer. The omentum was placed in an Endocatch bag. Table motion was then used to place the patient back in Trendelenburg. The endocatch bag was delivered out through the vagina.   The colpotomy at the vaginal cuff was closed with 0 Vicryl with a figure of eight stitch at each apex and 0 V-Loc to close the midportion of the cuff in running fashion in 2 layers.  Irrigation was used and excellent hemostasis was achieved. Given some bleeding from the right obturator bed during initial lymph node dissection on the right, Arista was placed within the obturator surgical beds.  At this point in the procedure was completed.  Robotic instruments were removed under direct visulaization.  The robot was undocked. The fascia at the 10-12 mm port was closed with 0 Vicryl on a UR-5 needle.  The subcuticular tissue was closed with 4-0 Vicryl and the skin was closed with 4-0 Monocryl in a subcuticular manner with some mattress stitches.  Dermabond was applied.    The vagina was swabbed with minimal bleeding noted. Foley catheter was removed  All sponge, lap and needle counts were correct x  3.   The patient was transferred to the recovery room in stable condition.  Comer Dollar, MD

## 2023-04-21 NOTE — Anesthesia Postprocedure Evaluation (Signed)
 Anesthesia Post Note  Patient: Joan [name]  Procedure(s) Performed: XI ROBOTIC ASSISTED TOTAL HYSTERECTOMY WITH BILATERAL SALPINGO OOPHORECTOMY; OMENTECTOMY (Bilateral) SENTINEL NODE BIOPSY LYMPH NODE DISSECTION     Patient location during evaluation: PACU Anesthesia Type: General Level of consciousness: awake and alert Pain management: pain level controlled Vital Signs Assessment: post-procedure vital signs reviewed and stable Respiratory status: spontaneous breathing, nonlabored ventilation, respiratory function stable and patient connected to nasal cannula oxygen Cardiovascular status: blood pressure returned to baseline and stable Postop Assessment: no apparent nausea or vomiting Anesthetic complications: no  No notable events documented.  Last Vitals:  Vitals:   04/21/23 1300 04/21/23 1400  BP: 130/74 133/66  Pulse: 67 79  Resp: 16 12  Temp: 36.7 C   SpO2: 96% 98%    Last Pain:  Vitals:   04/21/23 1400  TempSrc:   PainSc: 3                  Syed Zukas L Larcenia Holaday

## 2023-04-21 NOTE — Discharge Instructions (Addendum)
 AFTER SURGERY INSTRUCTIONS   Return to work: 4-6 weeks if applicable  PLAN TO START ELIQUIS  TOMORROW, JANUARY 10TH, IN THE AM. YOU WILL TAKE THIS TWICE DAILY (EVERY 12 HOURS).  You will need to be on a blood thinner after surgery to prevent blood clots for 2 weeks.   YOU WILL TAKE ELIQUIS  TWICE DAILY. THIS IS A BLOOD THINNER TO PREVENT BLOOD CLOTS SO YOU WILL NEED TO MONITOR FOR BLEEDING AND CALL THE OFFICE.  AVOID USE OF NSAIDS (IBUPROFEN , NAPROXEN ) WHILE TAKING THE BLOOD THINNER.    Activity: 1. Be up and out of the bed during the day.  Take a nap if needed.  You may walk up steps but be careful and use the hand rail.  Stair climbing will tire you more than you think, you may need to stop part way and rest.    2. No lifting or straining for 6 weeks over 10 pounds. No pushing, pulling, straining for 6 weeks.   3. No driving for 4-89 days when the following criteria have been met: Do not drive if you are taking narcotic pain medicine and make sure that your reaction time has returned.    4. You can shower as soon as the next day after surgery. Shower daily.  Use your regular soap and water  (not directly on the incision) and pat your incision(s) dry afterwards; don't rub.  No tub baths or submerging your body in water  until cleared by your surgeon. If you have the soap that was given to you by pre-surgical testing that was used before surgery, you do not need to use it afterwards because this can irritate your incisions.    5. No sexual activity and nothing in the vagina for 10-12 weeks.   6. You may experience a small amount of clear drainage from your incisions, which is normal.  If the drainage persists, increases, or changes color please call the office.   7. Do not use creams, lotions, or ointments such as neosporin on your incisions after surgery until advised by your surgeon because they can cause removal of the dermabond glue on your incisions.     8. You may experience vaginal  spotting after surgery or when the stitches at the top of the vagina begin to dissolve.  The spotting is normal but if you experience heavy bleeding, call our office.   9. Take Tylenol  first for pain if you are able to take these medication and only use narcotic pain medication for severe pain not relieved by the Tylenol .  Monitor your Tylenol  intake to a max of 4,000 mg in a 24 hour period.    Diet: 1. Low sodium Heart Healthy Diet is recommended but you are cleared to resume your normal (before surgery) diet after your procedure.   2. It is safe to use a laxative, such as Miralax or Colace, if you have difficulty moving your bowels before surgery. You have been prescribed Sennakot-S to take at bedtime every evening after surgery to keep bowel movements regular and to prevent constipation.     Wound Care: 1. Keep clean and dry.  Shower daily.   Reasons to call the Doctor: Fever - Oral temperature greater than 100.4 degrees Fahrenheit Foul-smelling vaginal discharge Difficulty urinating Nausea and vomiting Increased pain at the site of the incision that is unrelieved with pain medicine. Difficulty breathing with or without chest pain New calf pain especially if only on one side Sudden, continuing increased vaginal bleeding with or without  clots.   Contacts: For questions or concerns you should contact:   Dr. Comer Dollar at [phone]   Eleanor Epps, NP at [phone]   After Hours: call [phone] and have the GYN Oncologist paged/contacted (after 5 pm or on the weekends). You will speak with an after hours RN and let he or she know you have had surgery.   Messages sent via mychart are for non-urgent matters and are not responded to after hours so for urgent needs, please call the after hours number.

## 2023-04-21 NOTE — Anesthesia Procedure Notes (Signed)
 Procedure Name: Intubation Date/Time: 04/21/2023 8:40 AM  Performed by: Belvie Valri NOVAK, CRNAPre-anesthesia Checklist: Patient identified, Emergency Drugs available, Suction available and Patient being monitored Patient Re-evaluated:Patient Re-evaluated prior to induction Oxygen Delivery Method: Circle System Utilized Preoxygenation: Pre-oxygenation with 100% oxygen Induction Type: IV induction Ventilation: Mask ventilation without difficulty Laryngoscope Size: Mac and 3 Grade View: Grade II Tube type: Oral Tube size: 7.0 mm Number of attempts: 1 Airway Equipment and Method: Stylet and Oral airway Placement Confirmation: ETT inserted through vocal cords under direct vision, positive ETCO2 and breath sounds checked- equal and bilateral Secured at: 21 cm Tube secured with: Tape Dental Injury: Teeth and Oropharynx as per pre-operative assessment

## 2023-04-21 NOTE — Plan of Care (Signed)
  Problem: Education: Goal: Knowledge of General Education information will improve Description: Including pain rating scale, medication(s)/side effects and non-pharmacologic comfort measures Outcome: Progressing   Problem: Health Behavior/Discharge Planning: Goal: Ability to manage health-related needs will improve Outcome: Progressing   Problem: Clinical Measurements: Goal: Ability to maintain clinical measurements within normal limits will improve Outcome: Progressing Goal: Will remain free from infection Outcome: Progressing Goal: Diagnostic test results will improve Outcome: Progressing Goal: Respiratory complications will improve Outcome: Progressing Goal: Cardiovascular complication will be avoided Outcome: Progressing   Problem: Activity: Goal: Risk for activity intolerance will decrease Outcome: Progressing   Problem: Nutrition: Goal: Adequate nutrition will be maintained Outcome: Progressing   Problem: Coping: Goal: Level of anxiety will decrease Outcome: Progressing   Problem: Elimination: Goal: Will not experience complications related to bowel motility Outcome: Progressing Goal: Will not experience complications related to urinary retention Outcome: Progressing   Problem: Pain Management: Goal: General experience of comfort will improve Outcome: Progressing   Problem: Safety: Goal: Ability to remain free from injury will improve Outcome: Progressing   Problem: Skin Integrity: Goal: Risk for impaired skin integrity will decrease Outcome: Progressing   Problem: Education: Goal: Knowledge of the prescribed therapeutic regimen will improve Outcome: Progressing Goal: Understanding of sexual limitations or changes related to disease process or condition will improve Outcome: Progressing Goal: Individualized Educational Video(s) Outcome: Progressing   Problem: Self-Concept: Goal: Communication of feelings regarding changes in body function or  appearance will improve Outcome: Progressing   Problem: Skin Integrity: Goal: Demonstration of wound healing without infection will improve Outcome: Progressing

## 2023-04-22 ENCOUNTER — Telehealth: Payer: Self-pay | Admitting: Oncology

## 2023-04-22 ENCOUNTER — Encounter (HOSPITAL_COMMUNITY): Payer: Self-pay | Admitting: Gynecologic Oncology

## 2023-04-22 ENCOUNTER — Other Ambulatory Visit (HOSPITAL_COMMUNITY): Payer: Self-pay

## 2023-04-22 DIAGNOSIS — C541 Malignant neoplasm of endometrium: Secondary | ICD-10-CM | POA: Diagnosis not present

## 2023-04-22 LAB — BASIC METABOLIC PANEL
Anion gap: 11 (ref 5–15)
BUN: 14 mg/dL (ref 8–23)
CO2: 23 mmol/L (ref 22–32)
Calcium: 8.4 mg/dL — ABNORMAL LOW (ref 8.9–10.3)
Chloride: 100 mmol/L (ref 98–111)
Creatinine, Ser: 0.58 mg/dL (ref 0.44–1.00)
GFR, Estimated: >60 mL/min (ref >60)
Glucose, Bld: 122 mg/dL — ABNORMAL HIGH (ref 70–99)
Potassium: 3.6 mmol/L (ref 3.5–5.1)
Sodium: 134 mmol/L — ABNORMAL LOW (ref 135–145)

## 2023-04-22 LAB — CBC
HCT: 36.1 % (ref 36.0–46.0)
Hemoglobin: 12.3 g/dL (ref 12.0–15.0)
MCH: 34.2 pg — ABNORMAL HIGH (ref 26.0–34.0)
MCHC: 34.1 g/dL (ref 30.0–36.0)
MCV: 100.3 fL — ABNORMAL HIGH (ref 80.0–100.0)
Platelets: 229 10*3/uL (ref 150–400)
RBC: 3.6 MIL/uL — ABNORMAL LOW (ref 3.87–5.11)
RDW: 12.2 % (ref 11.5–15.5)
WBC: 13.7 10*3/uL — ABNORMAL HIGH (ref 4.0–10.5)
nRBC: 0 % (ref 0.0–0.2)

## 2023-04-22 NOTE — Progress Notes (Signed)
 1 Day Post-Op Procedure(s) (LRB): XI ROBOTIC ASSISTED TOTAL HYSTERECTOMY WITH BILATERAL SALPINGO OOPHORECTOMY; OMENTECTOMY (Bilateral) SENTINEL NODE BIOPSY (N/A) LYMPH NODE DISSECTION (N/A)  Subjective: Patient reports minimal abdominal pain. Voided several times throughout the night without difficulty and got out of bed by her self and felt steady. No nausea. Passing flatus. No vaginal bleeding reported. Feels well this am. Did not have SCDs on overnight. No concerns voiced.  Objective: Vital signs in last 24 hours: Temp:  [98 F (36.7 C)-98.6 F (37 C)] 98.3 F (36.8 C) (01/09 0602) Pulse Rate:  [64-92] 68 (01/09 0602) Resp:  [12-18] 14 (01/09 0602) BP: (102-133)/(57-78) 112/57 (01/09 0602) SpO2:  [95 %-100 %] 97 % (01/09 0602)    Intake/Output from previous day: 01/08 0701 - 01/09 0700 In: 1152.8 [I.V.:952.8; IV Piggyback:200] Out: 375 [Urine:150; Blood:75]  Physical Examination: General: alert, cooperative, and no distress Resp: clear to auscultation bilaterally Cardio: regular rate and rhythm, S1, S2 normal, no murmur, click, rub or gallop GI: incision: lap sites to the abdomen with dermabond without erythema or drainage and abdomen soft, active bowel sounds Extremities: extremities normal, atraumatic, no cyanosis or edema  Labs: WBC/Hgb/Hct/Plts:  13.7/12.3/36.1/229 (01/09 0415) BUN/Cr/glu/ALT/AST/amyl/lip:  14/0.58/--/--/--/--/-- (01/09 9584)  Assessment: 68 y.o. s/p Procedure(s): XI ROBOTIC ASSISTED TOTAL HYSTERECTOMY WITH BILATERAL SALPINGO OOPHORECTOMY; OMENTECTOMY SENTINEL NODE BIOPSY, LYMPH NODE DISSECTION: stable Pain:  Pain is well-controlled on PRN medications.  Heme: Hgb 12.3 and Hct 36.1 this am. Appropriate given preop values, surgical losses, IVF overnight.  CV: BP and HR stable. Continue to monitor while inpt.  GI:  Tolerating po: yes. Antiemetics ordered as needed.  GU: Voiding without difficulty. Creatinine 0.58 this am.    FEN: No critical values  on am labs.  Prophylaxis: SCDs (not on pt and not worn overnight) and lovenox  ordered.  Plan: Plan for discharge home.  To start Eliquis  tomorrow am.    LOS: 0 days    Joan [name] 04/22/2023, 7:31 AM

## 2023-04-22 NOTE — Telephone Encounter (Signed)
 Called Joan [name] and let her know that the Eliquis has been mailed to her home and will be delivered today.  Also let her know the copay was $47.00.  She verbalized understanding and agreement.

## 2023-04-22 NOTE — TOC Initial Note (Signed)
 Transition of Care [facility]) - Initial/Assessment Note    Patient Details  Name: Joan [name] MRN: [id number] Date of Birth: 10-20-1955  [facility]) CM/SW Contact:    Sonda Manuella Quill, RN Phone Number: 04/22/2023, 10:03 AM  Clinical Narrative:                 Spoke w/ pt in room; pt says she lives at home w/ her husband; she plans to return at d/c; family will provide transportation; she denies SDOH risks; pt verified insurance/PCP; pt says she has a cane, walker, and BSC; pt says she does not have HH services or home oxygen; no TOC needs.  Expected Discharge Plan: Home/Self Care Barriers to Discharge: No Barriers Identified   Patient Goals and CMS Choice Patient states their goals for this hospitalization and ongoing recovery are:: home CMS Medicare.gov Compare Post Acute Care list provided to:: Patient        Expected Discharge Plan and Services   Discharge Planning Services: CM Consult Post Acute Care Choice: NA Living arrangements for the past 2 months: Single Family Home Expected Discharge Date: 04/22/23               DME Arranged: N/A DME Agency: NA       HH Arranged: NA HH Agency: NA        Prior Living Arrangements/Services Living arrangements for the past 2 months: Single Family Home Lives with:: Spouse Patient language and need for interpreter reviewed:: Yes Do you feel safe going back to the place where you live?: Yes      Need for Family Participation in Patient Care: Yes (Comment) Care giver support system in place?: Yes (comment) Current home services: DME (cane, walker, BSC) Criminal Activity/Legal Involvement Pertinent to Current Situation/Hospitalization: No - Comment as needed  Activities of Daily Living   ADL Screening (condition at time of admission) Independently performs ADLs?: Yes (appropriate for developmental age) Is the patient deaf or have difficulty hearing?: No Does the patient have difficulty seeing, even when  wearing glasses/contacts?: No Does the patient have difficulty concentrating, remembering, or making decisions?: No  Permission Sought/Granted Permission sought to share information with : Case Manager Permission granted to share information with : Yes, Verbal Permission Granted  Share Information with NAME: Case Manager     Permission granted to share info w Relationship: Argie Lober (spouse) [phone]     Emotional Assessment Appearance:: Appears stated age Attitude/Demeanor/Rapport: Gracious Affect (typically observed): Accepting Orientation: : Oriented to Self, Oriented to Place, Oriented to  Time, Oriented to Situation Alcohol / Substance Use: Not Applicable Psych Involvement: No (comment)  Admission diagnosis:  Endometrial cancer [facility]) [C54.1] Patient Active Problem List   Diagnosis Date Noted   Endometrial cancer (HCC) 04/15/2023   Gait disturbance 06/25/2021   High risk medication use 12/26/2020   Numbness 06/08/2018   Hyperreflexia 06/08/2018   Tobacco abuse 05/29/2018   Weakness of both lower extremities 05/29/2018   Hyperlipidemia 05/29/2018   Essential hypertension 05/29/2018   DJD (degenerative joint disease) 05/29/2018   Multiple sclerosis (HCC) 05/29/2018   Hypokalemia 05/29/2018   Demyelinating disease (HCC) 05/29/2018   History of colon polyps    Bowel habit changes 10/01/2015   H/O adenomatous polyp of colon 10/01/2015   Reflux esophagitis    Dyspepsia 02/07/2015   Dysphagia 02/07/2015   Tubular adenoma 02/23/2011   PCP:  Shona Norleen PEDLAR, MD Pharmacy:   [facility] [address]  [place] [address] Phone: [phone] Fax: [phone]     Social Drivers of Health (SDOH) Social History: SDOH Screenings   Food Insecurity: No Food Insecurity (04/22/2023)  Housing: Low Risk  (04/22/2023)  Recent Concern: Housing - High Risk (04/22/2023)  Transportation Needs: No Transportation Needs (04/22/2023)  Utilities:  Not At Risk (04/22/2023)  Depression (PHQ2-9): Low Risk  (04/09/2023)  Social Connections: Unknown (04/21/2023)  Tobacco Use: High Risk (04/21/2023)   SDOH Interventions: Food Insecurity Interventions: Intervention Not Indicated, Inpatient TOC Housing Interventions: Intervention Not Indicated, Inpatient TOC Transportation Interventions: Intervention Not Indicated, Inpatient TOC Utilities Interventions: Intervention Not Indicated, Inpatient TOC   Readmission Risk Interventions     No data to display

## 2023-04-22 NOTE — Discharge Summary (Signed)
 Physician Discharge Summary  Patient ID: Joan [name] MRN: [id number] DOB/AGE: 66/09/1955 65 y.o.  Admit date: 04/21/2023 Discharge date: 04/22/2023  Admission Diagnoses: Endometrial cancer [facility])  Discharge Diagnoses:  Principal Problem:   Endometrial cancer [facility])   Discharged Condition:  The patient is in good condition and stable for discharge.    Hospital Course: On 04/21/2023, the patient underwent the following: Procedure(s): XI ROBOTIC ASSISTED TOTAL HYSTERECTOMY WITH BILATERAL SALPINGO OOPHORECTOMY; OMENTECTOMY SENTINEL NODE BIOPSY LYMPH NODE DISSECTION. The postoperative course was uneventful.  She was discharged to home on postoperative day 1 tolerating a regular diet, ambulating, voiding, pain controlled, no bleeding.   Consults: None  Significant Diagnostic Studies: am labs  Treatments: surgery: see above  Discharge Exam: Blood pressure (!) 112/57, pulse 68, temperature 98.3 F (36.8 C), resp. rate 14, height 5' 6 (1.676 m), weight 203 lb 14.8 oz (92.5 kg), SpO2 97%. General appearance: alert, cooperative, and no distress Resp: clear to auscultation bilaterally Cardio: regular rate and rhythm, S1, S2 normal, no murmur, click, rub or gallop GI: soft, non-tender; bowel sounds normal; no masses,  no organomegaly Extremities: extremities normal, atraumatic, no cyanosis or edema Incision/Wound: lap sites to the abdomen intact with dermabond without active drainage  Disposition: Discharge disposition: 01-Home or Self Care       Discharge Instructions     Call MD for:  difficulty breathing, headache or visual disturbances   Complete by: As directed    Call MD for:  extreme fatigue   Complete by: As directed    Call MD for:  hives   Complete by: As directed    Call MD for:  persistant dizziness or light-headedness   Complete by: As directed    Call MD for:  persistant nausea and vomiting   Complete by: As directed    Call MD for:  redness, tenderness, or  signs of infection (pain, swelling, redness, odor or green/yellow discharge around incision site)   Complete by: As directed    Call MD for:  severe uncontrolled pain   Complete by: As directed    Call MD for:  temperature >100.4   Complete by: As directed    Diet - low sodium heart healthy   Complete by: As directed    Driving Restrictions   Complete by: As directed    No driving for around 1 week(s) when the following criteria have been met: Do not take narcotics and drive. You need to make sure your reaction time has returned.   Increase activity slowly   Complete by: As directed    Lifting restrictions   Complete by: As directed    No lifting greater than 10 lbs, pushing, pulling, straining for 6 weeks.   Sexual Activity Restrictions   Complete by: As directed    No sexual activity, nothing in the vagina, for 10-12 weeks.      Allergies as of 04/22/2023   No Known Allergies      Medication List     TAKE these medications    acetaminophen  500 MG tablet Commonly known as: TYLENOL  Take 500 mg by mouth every 6 (six) hours as needed (as needed for pain).   Dimethyl Fumarate  240 MG Cpdr Take 1 capsule (240 mg total) by mouth 2 (two) times daily.   Eliquis  2.5 MG Tabs tablet Generic drug: apixaban  Take 1 tablet (2.5 mg total) by mouth 2 (two) times daily. For AFTER surgery only   rosuvastatin  10 MG tablet Commonly known as: CRESTOR  Take  1 tablet (10 mg total) by mouth daily.   senna-docusate 8.6-50 MG tablet Commonly known as: Senokot-S Take 2 tablets by mouth at bedtime. For AFTER surgery, do not take if having diarrhea   traMADol  50 MG tablet Commonly known as: ULTRAM  Take 1 tablet (50 mg total) by mouth every 6 (six) hours as needed for severe pain (pain score 7-10). For AFTER surgery only, do not take and drive   valsartan -hydrochlorothiazide  80-12.5 MG tablet Commonly known as: DIOVAN -HCT Take 1 tablet by mouth daily.   Vitamin D  50 MCG (2000 UT)  tablet Take 2,000-4,000 Units by mouth See admin instructions. Take 2000 units by mouth every other day, alternating with 4000 units on alternate days        Follow-up Information     Viktoria Comer SAUNDERS, MD Follow up on 04/29/2023.   Specialty: Gynecologic Oncology Why: around 6 pm will be a PHONE visit with Dr. Viktoria to check in and discuss path. IN PERSON visit will be on 05/27/2023 at 4:15pm at the [facility]. Contact information: [address] [place] [address] [phone]                 Greater than thirty minutes were spend for face to face discharge instructions and discharge orders/summary in EPIC.   Signed: Eleanor JONETTA Epps 04/22/2023, 8:27 AM

## 2023-04-22 NOTE — Plan of Care (Signed)
  Problem: Education: Goal: Knowledge of General Education information will improve Description: Including pain rating scale, medication(s)/side effects and non-pharmacologic comfort measures Outcome: Progressing   Problem: Health Behavior/Discharge Planning: Goal: Ability to manage health-related needs will improve Outcome: Progressing   Problem: Clinical Measurements: Goal: Ability to maintain clinical measurements within normal limits will improve Outcome: Progressing Goal: Will remain free from infection Outcome: Progressing Goal: Diagnostic test results will improve Outcome: Progressing Goal: Respiratory complications will improve Outcome: Progressing Goal: Cardiovascular complication will be avoided Outcome: Progressing   Problem: Activity: Goal: Risk for activity intolerance will decrease Outcome: Progressing   Problem: Nutrition: Goal: Adequate nutrition will be maintained Outcome: Progressing   Problem: Coping: Goal: Level of anxiety will decrease Outcome: Progressing   Problem: Elimination: Goal: Will not experience complications related to bowel motility Outcome: Progressing Goal: Will not experience complications related to urinary retention Outcome: Progressing   Problem: Pain Management: Goal: General experience of comfort will improve Outcome: Progressing   Problem: Safety: Goal: Ability to remain free from injury will improve Outcome: Progressing   Problem: Skin Integrity: Goal: Risk for impaired skin integrity will decrease Outcome: Progressing   Problem: Education: Goal: Knowledge of the prescribed therapeutic regimen will improve Outcome: Progressing Goal: Understanding of sexual limitations or changes related to disease process or condition will improve Outcome: Progressing Goal: Individualized Educational Video(s) Outcome: Progressing   Problem: Self-Concept: Goal: Communication of feelings regarding changes in body function or  appearance will improve Outcome: Progressing   Problem: Skin Integrity: Goal: Demonstration of wound healing without infection will improve Outcome: Progressing

## 2023-04-23 ENCOUNTER — Telehealth: Payer: Self-pay | Admitting: *Deleted

## 2023-04-23 LAB — CYTOLOGY - NON PAP

## 2023-04-23 NOTE — Telephone Encounter (Signed)
 Spoke with Joan [name] this morning. She states she is eating, drinking and urinating well. She has not had a BM yet but is passing gas. She is taking senokot as prescribed and encouraged her to drink plenty of water . She denies fever or chills. Incisions are dry and intact. She rates her pain 4/10. Her pain is controlled with tylenol  only.    Instructed to call office with any fever, chills, purulent drainage, uncontrolled pain or any other questions or concerns. Patient verbalizes understanding.   Pt aware of post op appointments as well as the office number [phone] and after hours number [phone] to call if she has any questions or concerns

## 2023-04-28 ENCOUNTER — Inpatient Hospital Stay: Payer: PPO | Admitting: Gynecologic Oncology

## 2023-04-28 ENCOUNTER — Telehealth: Payer: Self-pay | Admitting: Oncology

## 2023-04-28 ENCOUNTER — Encounter: Payer: Self-pay | Admitting: Gynecologic Oncology

## 2023-04-28 DIAGNOSIS — C539 Malignant neoplasm of cervix uteri, unspecified: Secondary | ICD-10-CM | POA: Diagnosis not present

## 2023-04-28 DIAGNOSIS — Z7189 Other specified counseling: Secondary | ICD-10-CM | POA: Diagnosis not present

## 2023-04-28 LAB — SURGICAL PATHOLOGY

## 2023-04-28 NOTE — Progress Notes (Signed)
 Gynecologic Oncology Telehealth Note: Gyn-Onc  I connected with Joan [name] on 04/28/23 at  6:00 PM EST by telephone and verified that I am speaking with the correct person using two identifiers.  I discussed the limitations, risks, security and privacy concerns of performing an evaluation and management service by telemedicine and the availability of in-person appointments. I also discussed with the patient that there may be a patient responsible charge related to this service. The patient expressed understanding and agreed to proceed.  Other persons participating in the visit and their role in the encounter: none.  Patient's location: home Provider's location: [facility]  Reason for Visit: follow-up  Treatment History: Oncology History Overview Note  Patient initially noticed postmenopausal bleeding in November.   Cervix cancer (HCC)  03/05/2023 Imaging   CT A/P: 1. No specific urinary tract abnormality is identified to explain the patient's hematuria. 2. Abnormal fluid density widening of the endometrial stripe 2.2 cm in diameter. Further investigation with dedicated pelvic sonography is recommended to exclude underlying malignancy. 3. Fluid density lesion in the head of the pancreas measuring 1.7 by 0.9 by 1.5 cm. Retrospectively stable and nonenhancing on MRI of 01/02/2022, establishing 1 year stability. Possibilities include intraductal papillary mucinous neoplasm or postinflammatory cystic lesion. Follow up pancreatic protocol MRI with and without contrast is recommended in 6 months time 4 standard surveillance. Alternatively, endoscopic ultrasound with fine-needle aspiration could be performed. This recommendation follows ACR consensus guidelines: Management of Incidental Pancreatic Cysts: A White Paper of the ACR Incidental Findings Committee. J Am Coll Radiol 2017;14:911-923. 4. Aortic atherosclerosis. 5. Lower thoracic and lumbar spondylosis and degenerative disc disease.    03/17/2023 Imaging   Pelvic ultrasound: The uterus is anteverted in position and measures 8.0 x 5.3 x 6.0 cm. It demonstrates a 4.4 x 3.4 cm cystic area, containing some debris, that occupies the majority of the body and fundus. There is a 2.5 x 1.7 x 1.7 cm hyperechoic lesion with irregular borders within the cervix that appears to demonstrate multiple echogenic foci and internal vascularity. The endometrium is not visualized.   The bilateral ovaries are not identified.   There is no fluid present within the cul-de-sac.   04/01/2023 Initial Biopsy   Endometrial biopsy shows poorly differentiated endometrial adenocarcinoma, p16 positive, p53 abnormal.   04/15/2023 Initial Diagnosis   Endometrial cancer (HCC)   04/21/2023 Surgery   Robotic-assisted laparoscopic total hysterectomy with bilateral salpingoophorectomy, SLN biopsy, selective right obturator lymphadenectomy, omentectomy   On EUA, 10 cm mobile uterus. On intra-abdominal entry, normal upper abdominal survey. Normal appearing although thick omentum. Normal small and large bowel. Uterus 10 cm and normal in appearance. Some paratubal and para-ovarian cystic lesions (all less than 5 mm). NO ascites. Mapping successful to bilateral deep obturator SLNs. On the right, there were several prominent obturator LNs that were removed after identification and removal of the right SLN.    04/21/2023 Pathology Results   A. RIGHT OBTURATOR SENTINEL LYMPH NODE, EXCISION:            One benign lymph node, negative for carcinoma (0/1)  B. RIGHT PROMINENT OBTURATOR LYMPH NODE, EXCISION:           Three benign lymph nodes, negative for carcinoma (0/3)  C. LEFT OBTURATOR SENTINEL LYMPH NODE, EXCISION:            One benign lymph node, negative for carcinoma (0/1)  D. UTERUS WITH RIGHT AND LEFT FALLOPIAN TUBE AND OVARY, HYSTERECTOMY AND BILATERAL SALPINGO-OOPHORECTOMY: Invasive squamous cell carcinoma  of the cervix, poorly differentiated Tumor measures  4.1 cm in greatest dimension and invades to a depth of 1.3 cm (pT1b3) Tumor extends to focally involve the ectocervical/vaginal margin Tumor invades to within 0.6 mm of the deep cervical margin Tumor circumferentially involves the cervix, endocervix and lower uterine segment Background severe squamous dysplasia to carcinoma in situ (HSIL, CIN-3) Extensive lymphatic invasion  Benign proliferative phase endometrium Benign paratubal cysts Benign fallopian tubes and ovaries  E. OMENTUM: Benign omentum, negative for malignancy  ONCOLOGY TABLE:  UTERINE CERVIX, CARCINOMA: Resection  Procedure: Total hysterectomy and bilateral salpingo-oophorectomy Tumor Size: 4.1 x 1.3 cm in greatest dimension Histologic Type: Squamous carcinoma Histologic Grade: Poorly-differentiated/high-grade Stromal Invasion: Present      Depth of stromal invasion (mm): 13 mm Lymphatic and/or Vascular Invasion: Present, extensive Other Tissue/ Organ: Not applicable Margins:      Margins Involved by Invasive Carcinoma: Tumor present in ectocervical/vaginal margin; 0.6 mm from deep stromal cervical margin Margin Status for HSIL or AIS: HSIL present focally in ectocervical margin      Regional Lymph Nodes: Pelvic Lymph Nodes Examined:     2 Sentinel     3 non-sentinel     5 total Pelvic Lymph Nodes with Metastasis: 0 Macrometastasis: (>2.0 mm): 0     Micrometastasis: (>0.2 mm and < 2.0 mm): 0     Isolated Tumor Cells (<0.2 mm): 0     Laterality of Lymph Node with Tumor: NA     Extracapsular Extension: NA Para-aortic Lymph Nodes Examined: Not applicable Para-aortic Lymph Nodes with Metastasis: Not applicable Distant Metastasis: Not applicable Pathologic Stage Classification (pTNM, AJCC 8th Edition): pT1b3, pN0 Ancillary Studies: Available upon request. Representative Tumor Block: D1-D22 Comment(s): Immunohistochemical stains were performed with adequate control on the invasive tumor. The tumor cells are  strongly and diffusely positive for CK5/6, patchy positive for p63, and are negative for p40. The findings are in keeping with a squamous cell carcinoma. An immunohistochemical stain for pancytokeratin is performed on each of the sentinel lymph nodes (specimens a and C) with adequate control and are negative for metastatic carcinoma. (v5.0.1.2)      Interval History: Doing well.  Denies any abdominal or pelvic pain.  Denies any vaginal bleeding.  Reports baseline bowel and bladder function.  Past Medical/Surgical History: Past Medical History:  Diagnosis Date   Cancer [facility])    Endometrial   Colitis, ischemic (HCC) 2003   Headaches, cluster    Hypercholesterolemia    Hypertension    Insomnia    Multiple sclerosis (HCC) 05/29/2018   Neuromuscular disorder (HCC)    Multiple Sclerosis   PE (pulmonary embolism)    ?birth control, in 80s   PONV (postoperative nausea and vomiting)    S/P colonoscopy 8413,2440   2004: tubular adeboma, 2007: hyperplastic polyps. Due 2012   Seasonal allergies    Tobacco abuse    Tubular adenoma 2004   colonoscopy 2004    Past Surgical History:  Procedure Laterality Date   BREAST BIOPSY Left    benign   COLONOSCOPY  09/17/05   20 cm polyp removed/bx/middescending colon polyp removed with snare   COLONOSCOPY  2003   tubular adenoma removed from cecum    COLONOSCOPY  03/31/2011   RMR, diverticulosis, multiple colon polyps removed (tubular adenomas). next TCS 03/2016   COLONOSCOPY N/A 10/22/2015   Procedure: COLONOSCOPY;  Surgeon: Suzette Espy, MD;  [facility];  Service: Endoscopy;  Laterality: N/A;  730   COLONOSCOPY N/A 02/05/2021  Procedure: COLONOSCOPY;  Surgeon: Suzette Espy, MD;  Location: [facility];  Service: Endoscopy;  Laterality: N/A;  ASA II / 9:30   ESOPHAGOGASTRODUODENOSCOPY N/A 02/15/2015   RMR, mild reflux esophagitis. empirical dilation of esophagus.    LYMPH NODE DISSECTION N/A 04/21/2023   Procedure: LYMPH  NODE DISSECTION;  Surgeon: Suzi Essex, MD;  Location: [facility];  Service: Gynecology;  Laterality: N/A;   MALONEY DILATION N/A 02/15/2015   Procedure: Londa Rival DILATION;  Surgeon: Suzette Espy, MD;  Location: [facility];  Service: Endoscopy;  Laterality: N/A;   POLYPECTOMY  02/05/2021   Procedure: POLYPECTOMY;  Surgeon: Suzette Espy, MD;  [facility]: [facility];  Service: Endoscopy;;   ROBOTIC ASSISTED TOTAL HYSTERECTOMY WITH BILATERAL SALPINGO OOPHERECTOMY Bilateral 04/21/2023   Procedure: XI ROBOTIC ASSISTED TOTAL HYSTERECTOMY WITH BILATERAL SALPINGO OOPHORECTOMY; OMENTECTOMY;  Surgeon: Suzi Essex, MD;  Location: [facility];  Service: Gynecology;  Laterality: Bilateral;   SENTINEL NODE BIOPSY N/A 04/21/2023   Procedure: SENTINEL NODE BIOPSY;  Surgeon: Suzi Essex, MD;  Location: [facility];  Service: Gynecology;  Laterality: N/A;   TUBAL LIGATION      Family History  Problem Relation Age of Onset   CVA Father    Hypertension Father    Breast cancer Maternal Aunt    Breast cancer Paternal Aunt    Colon cancer Neg Hx    Ovarian cancer Neg Hx    Endometrial cancer Neg Hx    Pancreatic cancer Neg Hx    Prostate cancer Neg Hx     Social History   Socioeconomic History   Marital status: Married    Spouse name: Andy Bannister   Number of children: 2   Years of education: GED   Highest education level: Not on file  Occupational History   Occupation: Public relations account executive  Tobacco Use   Smoking status: Every Day    Current packs/day: 0.50    Average packs/day: 0.5 packs/day for 30.0 years (15.0 ttl pk-yrs)    Types: Cigarettes   Smokeless tobacco: Never  Vaping Use   Vaping status: Some Days  Substance and Sexual Activity   Alcohol use: No    Alcohol/week: 0.0 standard drinks of alcohol   Drug use: No   Sexual activity: Not on file  Other Topics Concern   Not on file  Social History Narrative   Lives w/ husband   Caffeine use: none   Right handed   Social Drivers of  Corporate investment banker Strain: Not on file  Food Insecurity: No Food Insecurity (04/22/2023)   Hunger Vital Sign    Worried About Running Out of Food in the Last Year: Never true    Ran Out of Food in the Last Year: Never true  Transportation Needs: No Transportation Needs (04/22/2023)   PRAPARE - Administrator, Civil Service (Medical): No    Lack of Transportation (Non-Medical): No  Physical Activity: Not on file  Stress: Not on file  Social Connections: Unknown (04/21/2023)   Social Connection and Isolation Panel [NHANES]    Frequency of Communication with Friends and Family: Not on file    Frequency of Social Gatherings with Friends and Family: Not on file    Attends Religious Services: 1 to 4 times per year    Active Member of [employer] or Organizations: No    Attends Engineer, structural: More than 4 times per year    Marital Status: Married    Current  Medications:  Current Outpatient Medications:    acetaminophen  (TYLENOL ) 500 MG tablet, Take 500 mg by mouth every 6 (six) hours as needed (as needed for pain)., Disp: , Rfl:    apixaban  (ELIQUIS ) 2.5 MG TABS tablet, Take 1 tablet (2.5 mg total) by mouth 2 (two) times daily. For AFTER surgery only, Disp: 28 tablet, Rfl: 0   Cholecalciferol  (VITAMIN D ) 50 MCG (2000 UT) tablet, Take 2,000-4,000 Units by mouth See admin instructions. Take 2000 units by mouth every other day, alternating with 4000 units on alternate days, Disp: , Rfl:    Dimethyl Fumarate  240 MG CPDR, Take 1 capsule (240 mg total) by mouth 2 (two) times daily., Disp: 180 capsule, Rfl: 1   rosuvastatin  (CRESTOR ) 10 MG tablet, Take 1 tablet (10 mg total) by mouth daily., Disp: 90 tablet, Rfl: 1   senna-docusate (SENOKOT-S) 8.6-50 MG tablet, Take 2 tablets by mouth at bedtime. For AFTER surgery, do not take if having diarrhea, Disp: 30 tablet, Rfl: 0   traMADol  (ULTRAM ) 50 MG tablet, Take 1 tablet (50 mg total) by mouth every 6 (six) hours as needed for  severe pain (pain score 7-10). For AFTER surgery only, do not take and drive, Disp: 10 tablet, Rfl: 0   valsartan -hydrochlorothiazide  (DIOVAN -HCT) 80-12.5 MG tablet, Take 1 tablet by mouth daily., Disp: 90 tablet, Rfl: 0  Review of Symptoms: Pertinent positives as per HPI.  Physical Exam: Deferred given limitations of phone visit.  Laboratory & Radiologic Studies: See above  Assessment & Plan: Joan [name] is a 68 y.o. woman with at least stage IIA poorly differentiated squamous cell carcinoma of the cervix who presents for follow-up after recent surgery.  Patient is doing very well, meeting postoperative milestones.  Discussed continued expectations and restrictions.  Unfortunately, diagnosis of cervix cancer is quite unexpected.  I reviewed this information with her.  I spoke with the pathologist today to confirm findings from her recent hysterectomy specimen.  Unfortunately, her initial biopsy was performed at an outside facility.  I will ask my clinic to facilitate getting this here for review.  Sentinel lymph node evaluation negative.  On preoperative imaging, no evidence of metastatic disease.  Paracervical margin close but negative.  Unfortunately, vaginal margin is positive.  Given that the patient meets Onesimo Bijou criteria, discussed with her my recommendation for radiation with sensitizing cisplatin .  Discussed treatment here versus closer to home.  She voiced interest in treatment either at [facility] or [facility].  I will reach out to Dr. Eloise Hake about plan for radiation and whether she could get at least part of her treatment elsewhere. I will ask Mariah Shines, our nurse navigator, to follow-up with the patient. All questions answered.  I discussed the assessment and treatment plan with the patient. The patient was provided with an opportunity to ask questions and all were answered. The patient agreed with the plan and demonstrated an understanding of the instructions.   The patient  was advised to call back or see an in-person evaluation if the symptoms worsen or if the condition fails to improve as anticipated.   14 minutes of total time was spent for this patient encounter, including preparation, phone counseling with the patient and coordination of care, and documentation of the encounter.   Wiley Hanger, MD  Division of Gynecologic Oncology  Department of Obstetrics and Gynecology  [facility]

## 2023-04-28 NOTE — Telephone Encounter (Signed)
 Called [facility] regarding treatment.  She lives in [place] so she would like to go to [facility] for radiation and [facility] for chemotherapy.  Explained that she will need more radiation (brachytherapy) after external beam that is not available at [facility].  Asked if she would like to go to [facility] or [facility].  She would prefer to come to the [facility] since it is closer for her.  Advised I will start working on referrals and will call her back with appointments/updates.

## 2023-04-29 ENCOUNTER — Encounter: Payer: Self-pay | Admitting: Obstetrics

## 2023-04-29 ENCOUNTER — Telehealth: Payer: Self-pay | Admitting: Family Medicine

## 2023-04-29 ENCOUNTER — Telehealth: Payer: Self-pay | Admitting: Oncology

## 2023-04-29 ENCOUNTER — Ambulatory Visit: Payer: PPO | Admitting: Urology

## 2023-04-29 ENCOUNTER — Telehealth: Payer: PPO | Admitting: Gynecologic Oncology

## 2023-04-29 NOTE — Telephone Encounter (Signed)
Called Joan [name] again and talked more about setting up a referral for medical oncology and radiation oncology.  She prefers to go to [facility] if possible but would also be able to go to [facility].  Discussed that she will need chemotherapy and radiation together which may be difficult to coordinate if they are at two different facilities.  She agreed to have both referrals sent to [facility]. Also discussed that she will need brachytherapy after the initial chemotherapy and external beam radiation that is not offered at [facility].  She would like to come to [facility] for the brachytherapy.  Advised I will send the referrals to [facility] and that she should hear from them this afternoon.  Faxed referral to [facility] at [phone] successfully.

## 2023-04-29 NOTE — Telephone Encounter (Signed)
Pt states she just had surgery for cancer and is concerned with chemo and radiation affecting her MS. Requesting call back to discuss

## 2023-04-29 NOTE — Telephone Encounter (Signed)
Spoke with Dr Epimenio Foot and informed what was going on with the patient. He states that she is ok to move forward with chemo and radiation. Getting chemo/radiation is not a contraindication in patients with MS.  From Dr. Bonnita Hollow standpoint patient  would be ok to stay on therapy but she should ask her oncologist if she needs to stop the dimethyl fumarate.  Called the patient and made her aware that Dr Epimenio Foot was not opposed to the patient moving forward with chemo and radiation. I advised that with chemo and radiation that can sometimes place at higher risk for infections and which in turn can cause a MS exacerbation so she would need to be extra cautious in trying to stay healthy during that time. Advised the treatment itself should not worsen MS.  She was relieved to hear that. I did tell her per  Dr Epimenio Foot he would think it is ok for her to continue dimethyl fumarate but would recommend communicating that to oncologist and confirm that they agree. Pt verbalized understanding.

## 2023-04-30 ENCOUNTER — Encounter: Payer: Self-pay | Admitting: Oncology

## 2023-04-30 ENCOUNTER — Telehealth: Payer: Self-pay | Admitting: Oncology

## 2023-04-30 NOTE — Telephone Encounter (Signed)
Left a message with Angie at [facility] regarding slide request.  Asked for a return call.

## 2023-04-30 NOTE — Progress Notes (Signed)
Requested PD-L1 testing on accession [id number] with [facility] via email.

## 2023-05-03 ENCOUNTER — Ambulatory Visit (HOSPITAL_COMMUNITY): Payer: PPO

## 2023-05-03 ENCOUNTER — Telehealth: Payer: Self-pay | Admitting: Oncology

## 2023-05-03 NOTE — Telephone Encounter (Signed)
Left a message for Angie at [facility] again regarding slide request.  Also called customer service and spoke to [place]. They do not have a record of the fax sent on 04/28/23 requesting the slides.  She said to fax the request again and they will send out the custody agreement and will send out the slides once we have returned the signed custody agreement. Faxed urgent request again to [phone].

## 2023-05-05 DIAGNOSIS — G35 Multiple sclerosis: Secondary | ICD-10-CM | POA: Diagnosis not present

## 2023-05-05 DIAGNOSIS — C539 Malignant neoplasm of cervix uteri, unspecified: Secondary | ICD-10-CM | POA: Diagnosis not present

## 2023-05-05 DIAGNOSIS — C541 Malignant neoplasm of endometrium: Secondary | ICD-10-CM | POA: Diagnosis not present

## 2023-05-05 DIAGNOSIS — R918 Other nonspecific abnormal finding of lung field: Secondary | ICD-10-CM | POA: Diagnosis not present

## 2023-05-05 DIAGNOSIS — K869 Disease of pancreas, unspecified: Secondary | ICD-10-CM | POA: Diagnosis not present

## 2023-05-06 ENCOUNTER — Encounter (HOSPITAL_COMMUNITY): Payer: Self-pay

## 2023-05-06 ENCOUNTER — Ambulatory Visit (HOSPITAL_COMMUNITY)
Admission: RE | Admit: 2023-05-06 | Discharge: 2023-05-06 | Disposition: A | Discharge status: Home / Self Care | Payer: PPO | Source: Ambulatory Visit | Attending: Internal Medicine | Admitting: Internal Medicine

## 2023-05-06 DIAGNOSIS — C541 Malignant neoplasm of endometrium: Secondary | ICD-10-CM | POA: Diagnosis not present

## 2023-05-06 DIAGNOSIS — Z1231 Encounter for screening mammogram for malignant neoplasm of breast: Secondary | ICD-10-CM | POA: Diagnosis not present

## 2023-05-06 DIAGNOSIS — Z452 Encounter for adjustment and management of vascular access device: Secondary | ICD-10-CM | POA: Diagnosis not present

## 2023-05-06 DIAGNOSIS — C539 Malignant neoplasm of cervix uteri, unspecified: Secondary | ICD-10-CM | POA: Diagnosis not present

## 2023-05-06 LAB — MOLECULAR PATHOLOGY

## 2023-05-07 ENCOUNTER — Other Ambulatory Visit (HOSPITAL_COMMUNITY): Payer: Self-pay

## 2023-05-08 ENCOUNTER — Other Ambulatory Visit (HOSPITAL_COMMUNITY): Payer: Self-pay

## 2023-05-08 MED ORDER — ROSUVASTATIN CALCIUM 10 MG PO TABS
10.0000 mg | ORAL_TABLET | Freq: Every day | ORAL | 1 refills | Status: DC
  Filled 2023-05-08 – 2023-05-10 (×2): qty 90, 90d supply, fill #0
  Filled 2023-08-09: qty 90, 90d supply, fill #1

## 2023-05-10 ENCOUNTER — Other Ambulatory Visit (HOSPITAL_COMMUNITY): Payer: Self-pay

## 2023-05-10 ENCOUNTER — Other Ambulatory Visit: Payer: Self-pay

## 2023-05-10 ENCOUNTER — Other Ambulatory Visit: Payer: Self-pay | Admitting: Oncology

## 2023-05-10 DIAGNOSIS — E785 Hyperlipidemia, unspecified: Secondary | ICD-10-CM | POA: Diagnosis not present

## 2023-05-10 DIAGNOSIS — N3941 Urge incontinence: Secondary | ICD-10-CM | POA: Diagnosis not present

## 2023-05-10 DIAGNOSIS — Z90710 Acquired absence of both cervix and uterus: Secondary | ICD-10-CM | POA: Diagnosis not present

## 2023-05-10 DIAGNOSIS — J449 Chronic obstructive pulmonary disease, unspecified: Secondary | ICD-10-CM | POA: Diagnosis not present

## 2023-05-10 DIAGNOSIS — I2510 Atherosclerotic heart disease of native coronary artery without angina pectoris: Secondary | ICD-10-CM | POA: Diagnosis not present

## 2023-05-10 DIAGNOSIS — C539 Malignant neoplasm of cervix uteri, unspecified: Secondary | ICD-10-CM | POA: Diagnosis not present

## 2023-05-10 DIAGNOSIS — Z510 Encounter for antineoplastic radiation therapy: Secondary | ICD-10-CM | POA: Diagnosis not present

## 2023-05-10 DIAGNOSIS — Z803 Family history of malignant neoplasm of breast: Secondary | ICD-10-CM | POA: Diagnosis not present

## 2023-05-10 DIAGNOSIS — Z90722 Acquired absence of ovaries, bilateral: Secondary | ICD-10-CM | POA: Diagnosis not present

## 2023-05-10 DIAGNOSIS — I10 Essential (primary) hypertension: Secondary | ICD-10-CM | POA: Diagnosis not present

## 2023-05-10 DIAGNOSIS — I700 Atherosclerosis of aorta: Secondary | ICD-10-CM | POA: Diagnosis not present

## 2023-05-10 NOTE — Progress Notes (Signed)
Gynecologic Oncology Multi-Disciplinary Disposition Conference Note  Date of the Conference: 05/10/2023  Patient Name: Joan [name]  Referring Provider: Dr. Ernestina Penna Primary GYN Oncologist: Dr. Pricilla Holm   Stage/Disposition:  At least stage IIA squamous cell carcinoma of the cervix. Disposition is to chemotherapy with cisplatin and external beam radiation followed by brachytherapy.   This Multidisciplinary conference took place involving physicians from Gynecologic Oncology, Medical Oncology, Radiation Oncology, Pathology, Radiology along with the Gynecologic Oncology Nurse Practitioner and Gynecologic Oncology Nurse Navigator.  Comprehensive assessment of the patient's malignancy, staging, need for surgery, chemotherapy, radiation therapy, and need for further testing were reviewed. Supportive measures, both inpatient and following discharge were also discussed. The recommended plan of care is documented. Greater than 35 minutes were spent correlating and coordinating this patient's care.

## 2023-05-12 DIAGNOSIS — C538 Malignant neoplasm of overlapping sites of cervix uteri: Secondary | ICD-10-CM | POA: Diagnosis not present

## 2023-05-14 DIAGNOSIS — C539 Malignant neoplasm of cervix uteri, unspecified: Secondary | ICD-10-CM | POA: Diagnosis not present

## 2023-05-14 DIAGNOSIS — J9811 Atelectasis: Secondary | ICD-10-CM | POA: Diagnosis not present

## 2023-05-14 DIAGNOSIS — I2510 Atherosclerotic heart disease of native coronary artery without angina pectoris: Secondary | ICD-10-CM | POA: Diagnosis not present

## 2023-05-14 DIAGNOSIS — G35 Multiple sclerosis: Secondary | ICD-10-CM | POA: Diagnosis not present

## 2023-05-14 DIAGNOSIS — E785 Hyperlipidemia, unspecified: Secondary | ICD-10-CM | POA: Diagnosis not present

## 2023-05-14 DIAGNOSIS — Z452 Encounter for adjustment and management of vascular access device: Secondary | ICD-10-CM | POA: Diagnosis not present

## 2023-05-14 DIAGNOSIS — I10 Essential (primary) hypertension: Secondary | ICD-10-CM | POA: Diagnosis not present

## 2023-05-14 DIAGNOSIS — Z87891 Personal history of nicotine dependence: Secondary | ICD-10-CM | POA: Diagnosis not present

## 2023-05-14 DIAGNOSIS — J449 Chronic obstructive pulmonary disease, unspecified: Secondary | ICD-10-CM | POA: Diagnosis not present

## 2023-05-14 DIAGNOSIS — Z860100 Personal history of colon polyps, unspecified: Secondary | ICD-10-CM | POA: Diagnosis not present

## 2023-05-14 LAB — SURGICAL PATHOLOGY

## 2023-05-15 DIAGNOSIS — C539 Malignant neoplasm of cervix uteri, unspecified: Secondary | ICD-10-CM | POA: Diagnosis not present

## 2023-05-17 ENCOUNTER — Other Ambulatory Visit: Payer: Self-pay

## 2023-05-17 ENCOUNTER — Other Ambulatory Visit (HOSPITAL_COMMUNITY): Payer: Self-pay

## 2023-05-17 DIAGNOSIS — C539 Malignant neoplasm of cervix uteri, unspecified: Secondary | ICD-10-CM | POA: Diagnosis not present

## 2023-05-17 DIAGNOSIS — C541 Malignant neoplasm of endometrium: Secondary | ICD-10-CM | POA: Diagnosis not present

## 2023-05-17 MED ORDER — ONDANSETRON HCL 8 MG PO TABS
8.0000 mg | ORAL_TABLET | Freq: Three times a day (TID) | ORAL | 2 refills | Status: DC | PRN
  Filled 2023-05-17: qty 30, 10d supply, fill #0

## 2023-05-17 MED ORDER — PROCHLORPERAZINE MALEATE 10 MG PO TABS
10.0000 mg | ORAL_TABLET | Freq: Four times a day (QID) | ORAL | 2 refills | Status: DC | PRN
  Filled 2023-05-17: qty 30, 8d supply, fill #0

## 2023-05-20 DIAGNOSIS — C538 Malignant neoplasm of overlapping sites of cervix uteri: Secondary | ICD-10-CM | POA: Diagnosis not present

## 2023-05-20 DIAGNOSIS — C539 Malignant neoplasm of cervix uteri, unspecified: Secondary | ICD-10-CM | POA: Diagnosis not present

## 2023-05-20 DIAGNOSIS — Z510 Encounter for antineoplastic radiation therapy: Secondary | ICD-10-CM | POA: Diagnosis not present

## 2023-05-21 DIAGNOSIS — C538 Malignant neoplasm of overlapping sites of cervix uteri: Secondary | ICD-10-CM | POA: Diagnosis not present

## 2023-05-23 DIAGNOSIS — C539 Malignant neoplasm of cervix uteri, unspecified: Secondary | ICD-10-CM | POA: Diagnosis not present

## 2023-05-25 DIAGNOSIS — C541 Malignant neoplasm of endometrium: Secondary | ICD-10-CM | POA: Diagnosis not present

## 2023-05-25 DIAGNOSIS — Z5111 Encounter for antineoplastic chemotherapy: Secondary | ICD-10-CM | POA: Diagnosis not present

## 2023-05-25 DIAGNOSIS — R790 Abnormal level of blood mineral: Secondary | ICD-10-CM | POA: Diagnosis not present

## 2023-05-25 DIAGNOSIS — Z95828 Presence of other vascular implants and grafts: Secondary | ICD-10-CM | POA: Diagnosis not present

## 2023-05-25 DIAGNOSIS — C539 Malignant neoplasm of cervix uteri, unspecified: Secondary | ICD-10-CM | POA: Diagnosis not present

## 2023-05-26 DIAGNOSIS — R790 Abnormal level of blood mineral: Secondary | ICD-10-CM | POA: Diagnosis not present

## 2023-05-26 DIAGNOSIS — Z5111 Encounter for antineoplastic chemotherapy: Secondary | ICD-10-CM | POA: Diagnosis not present

## 2023-05-26 DIAGNOSIS — C539 Malignant neoplasm of cervix uteri, unspecified: Secondary | ICD-10-CM | POA: Diagnosis not present

## 2023-05-26 DIAGNOSIS — C538 Malignant neoplasm of overlapping sites of cervix uteri: Secondary | ICD-10-CM | POA: Diagnosis not present

## 2023-05-27 ENCOUNTER — Inpatient Hospital Stay: Payer: PPO | Attending: Gynecologic Oncology | Admitting: Gynecologic Oncology

## 2023-05-27 ENCOUNTER — Encounter: Payer: Self-pay | Admitting: Gynecologic Oncology

## 2023-05-27 VITALS — BP 129/48 | HR 67 | Temp 98.7°F | Resp 18 | Wt 211.6 lb

## 2023-05-27 DIAGNOSIS — C538 Malignant neoplasm of overlapping sites of cervix uteri: Secondary | ICD-10-CM | POA: Diagnosis not present

## 2023-05-27 DIAGNOSIS — Z9079 Acquired absence of other genital organ(s): Secondary | ICD-10-CM

## 2023-05-27 DIAGNOSIS — Z510 Encounter for antineoplastic radiation therapy: Secondary | ICD-10-CM | POA: Diagnosis not present

## 2023-05-27 DIAGNOSIS — C539 Malignant neoplasm of cervix uteri, unspecified: Secondary | ICD-10-CM

## 2023-05-27 DIAGNOSIS — Z90722 Acquired absence of ovaries, bilateral: Secondary | ICD-10-CM

## 2023-05-27 DIAGNOSIS — Z90710 Acquired absence of both cervix and uterus: Secondary | ICD-10-CM

## 2023-05-27 DIAGNOSIS — Z7189 Other specified counseling: Secondary | ICD-10-CM

## 2023-05-27 NOTE — Patient Instructions (Signed)
It was good to see you today.  You are healing well from surgery!  Please remember, no heavy lifting for 6 weeks after surgery and nothing in the vagina for at least 10-12.  I will see you for follow-up once you have finished radiation treatment.  We typically get a repeat scan about 3 months after you finish radiation.

## 2023-05-27 NOTE — Progress Notes (Signed)
Gynecologic Oncology Return Clinic Visit  05/27/23  Reason for Visit: post-op follow-up, treatment planning  Treatment History: Oncology History Overview Note  Patient initially noticed postmenopausal bleeding in November.  PDL1 CPS 1%   Cervix cancer (HCC)  03/05/2023 Imaging   CT A/P: 1. No specific urinary tract abnormality is identified to explain the patient's hematuria. 2. Abnormal fluid density widening of the endometrial stripe 2.2 cm in diameter. Further investigation with dedicated pelvic sonography is recommended to exclude underlying malignancy. 3. Fluid density lesion in the head of the pancreas measuring 1.7 by 0.9 by 1.5 cm. Retrospectively stable and nonenhancing on MRI of 01/02/2022, establishing 1 year stability. Possibilities include intraductal papillary mucinous neoplasm or postinflammatory cystic lesion. Follow up pancreatic protocol MRI with and without contrast is recommended in 6 months time 4 standard surveillance. Alternatively, endoscopic ultrasound with fine-needle aspiration could be performed. This recommendation follows ACR consensus guidelines: Management of Incidental Pancreatic Cysts: A White Paper of the ACR Incidental Findings Committee. J Am Coll Radiol 2017;14:911-923. 4. Aortic atherosclerosis. 5. Lower thoracic and lumbar spondylosis and degenerative disc disease.   03/17/2023 Imaging   Pelvic ultrasound: The uterus is anteverted in position and measures 8.0 x 5.3 x 6.0 cm. It demonstrates a 4.4 x 3.4 cm cystic area, containing some debris, that occupies the majority of the body and fundus. There is a 2.5 x 1.7 x 1.7 cm hyperechoic lesion with irregular borders within the cervix that appears to demonstrate multiple echogenic foci and internal vascularity. The endometrium is not visualized.   The bilateral ovaries are not identified.   There is no fluid present within the cul-de-sac.   04/01/2023 Initial Biopsy   Endometrial biopsy shows poorly  differentiated endometrial adenocarcinoma, p16 positive, p53 abnormal.   04/15/2023 Initial Diagnosis   Endometrial cancer (HCC)   04/21/2023 Surgery   Robotic-assisted laparoscopic total hysterectomy with bilateral salpingoophorectomy, SLN biopsy, selective right obturator lymphadenectomy, omentectomy   On EUA, 10 cm mobile uterus. On intra-abdominal entry, normal upper abdominal survey. Normal appearing although thick omentum. Normal small and large bowel. Uterus 10 cm and normal in appearance. Some paratubal and para-ovarian cystic lesions (all less than 5 mm). NO ascites. Mapping successful to bilateral deep obturator SLNs. On the right, there were several prominent obturator LNs that were removed after identification and removal of the right SLN.    04/21/2023 Pathology Results   A. RIGHT OBTURATOR SENTINEL LYMPH NODE, EXCISION:            One benign lymph node, negative for carcinoma (0/1)  B. RIGHT PROMINENT OBTURATOR LYMPH NODE, EXCISION:           Three benign lymph nodes, negative for carcinoma (0/3)  C. LEFT OBTURATOR SENTINEL LYMPH NODE, EXCISION:            One benign lymph node, negative for carcinoma (0/1)  D. UTERUS WITH RIGHT AND LEFT FALLOPIAN TUBE AND OVARY, HYSTERECTOMY AND BILATERAL SALPINGO-OOPHORECTOMY: Invasive squamous cell carcinoma of the cervix, poorly differentiated Tumor measures 4.1 cm in greatest dimension and invades to a depth of 1.3 cm (pT1b3) Tumor extends to focally involve the ectocervical/vaginal margin Tumor invades to within 0.6 mm of the deep cervical margin Tumor circumferentially involves the cervix, endocervix and lower uterine segment Background severe squamous dysplasia to carcinoma in situ (HSIL, CIN-3) Extensive lymphatic invasion  Benign proliferative phase endometrium Benign paratubal cysts Benign fallopian tubes and ovaries  E. OMENTUM: Benign omentum, negative for malignancy  ONCOLOGY TABLE:  UTERINE CERVIX, CARCINOMA:  Resection  Procedure: Total hysterectomy and bilateral salpingo-oophorectomy Tumor Size: 4.1 x 1.3 cm in greatest dimension Histologic Type: Squamous carcinoma Histologic Grade: Poorly-differentiated/high-grade Stromal Invasion: Present      Depth of stromal invasion (mm): 13 mm Lymphatic and/or Vascular Invasion: Present, extensive Other Tissue/ Organ: Not applicable Margins:      Margins Involved by Invasive Carcinoma: Tumor present in ectocervical/vaginal margin; 0.6 mm from deep stromal cervical margin Margin Status for HSIL or AIS: HSIL present focally in ectocervical margin      Regional Lymph Nodes: Pelvic Lymph Nodes Examined:     2 Sentinel     3 non-sentinel     5 total Pelvic Lymph Nodes with Metastasis: 0 Macrometastasis: (>2.0 mm): 0     Micrometastasis: (>0.2 mm and < 2.0 mm): 0     Isolated Tumor Cells (<0.2 mm): 0     Laterality of Lymph Node with Tumor: NA     Extracapsular Extension: NA Para-aortic Lymph Nodes Examined: Not applicable Para-aortic Lymph Nodes with Metastasis: Not applicable Distant Metastasis: Not applicable Pathologic Stage Classification (pTNM, AJCC 8th Edition): pT1b3, pN0 Ancillary Studies: Available upon request. Representative Tumor Block: D1-D22 Comment(s): Immunohistochemical stains were performed with adequate control on the invasive tumor. The tumor cells are strongly and diffusely positive for CK5/6, patchy positive for p63, and are negative for p40. The findings are in keeping with a squamous cell carcinoma. An immunohistochemical stain for pancytokeratin is performed on each of the sentinel lymph nodes (specimens a and C) with adequate control and are negative for metastatic carcinoma. (v5.0.1.2)      Interval History: Doing well.  Started radiation and chemotherapy this week.  Denies any vaginal bleeding or discharge.  Reports baseline bowel bladder function.  Denies any significant abdominal or pelvic pain.  Past  Medical/Surgical History: Past Medical History:  Diagnosis Date   Cancer [facility])    Endometrial   Colitis, ischemic (HCC) 2003   Headaches, cluster    Hypercholesterolemia    Hypertension    Insomnia    Multiple sclerosis (HCC) 05/29/2018   Neuromuscular disorder (HCC)    Multiple Sclerosis   PE (pulmonary embolism)    ?birth control, in 33s   PONV (postoperative nausea and vomiting)    S/P colonoscopy 4098,1191   2004: tubular adeboma, 2007: hyperplastic polyps. Due 2012   Seasonal allergies    Tobacco abuse    Tubular adenoma 2004   colonoscopy 2004    Past Surgical History:  Procedure Laterality Date   BREAST BIOPSY Left    benign   COLONOSCOPY  09/17/05   20 cm polyp removed/bx/middescending colon polyp removed with snare   COLONOSCOPY  2003   tubular adenoma removed from cecum    COLONOSCOPY  03/31/2011   RMR, diverticulosis, multiple colon polyps removed (tubular adenomas). next TCS 03/2016   COLONOSCOPY N/A 10/22/2015   Procedure: COLONOSCOPY;  Surgeon: Corbin Ade, MD;  [facility];  Service: Endoscopy;  Laterality: N/A;  730   COLONOSCOPY N/A 02/05/2021   Procedure: COLONOSCOPY;  Surgeon: Corbin Ade, MD;  Location: [facility];  Service: Endoscopy;  Laterality: N/A;  ASA II / 9:30   ESOPHAGOGASTRODUODENOSCOPY N/A 02/15/2015   RMR, mild reflux esophagitis. empirical dilation of esophagus.    LYMPH NODE DISSECTION N/A 04/21/2023   Procedure: LYMPH NODE DISSECTION;  Surgeon: Carver Fila, MD;  Location: [facility];  Service: Gynecology;  Laterality: N/A;   MALONEY DILATION N/A 02/15/2015   Procedure: MALONEY DILATION;  Surgeon: Corbin Ade, MD;  Location: [facility];  Service: Endoscopy;  Laterality: N/A;   POLYPECTOMY  02/05/2021   Procedure: POLYPECTOMY;  Surgeon: Corbin Ade, MD;  [facility]: [facility];  Service: Endoscopy;;   ROBOTIC ASSISTED TOTAL HYSTERECTOMY WITH BILATERAL SALPINGO OOPHERECTOMY Bilateral 04/21/2023   Procedure: XI  ROBOTIC ASSISTED TOTAL HYSTERECTOMY WITH BILATERAL SALPINGO OOPHORECTOMY; OMENTECTOMY;  Surgeon: Carver Fila, MD;  Location: [facility];  Service: Gynecology;  Laterality: Bilateral;   SENTINEL NODE BIOPSY N/A 04/21/2023   Procedure: SENTINEL NODE BIOPSY;  Surgeon: Carver Fila, MD;  Location: [facility];  Service: Gynecology;  Laterality: N/A;   TUBAL LIGATION      Family History  Problem Relation Age of Onset   CVA Father    Hypertension Father    Breast cancer Maternal Aunt    Breast cancer Paternal Aunt    Colon cancer Neg Hx    Ovarian cancer Neg Hx    Endometrial cancer Neg Hx    Pancreatic cancer Neg Hx    Prostate cancer Neg Hx     Social History   Socioeconomic History   Marital status: Married    Spouse name: Maisie Fus   Number of children: 2   Years of education: GED   Highest education level: Not on file  Occupational History   Occupation: Public relations account executive  Tobacco Use   Smoking status: Every Day    Current packs/day: 0.50    Average packs/day: 0.5 packs/day for 30.0 years (15.0 ttl pk-yrs)    Types: Cigarettes   Smokeless tobacco: Never  Vaping Use   Vaping status: Some Days  Substance and Sexual Activity   Alcohol use: No    Alcohol/week: 0.0 standard drinks of alcohol   Drug use: No   Sexual activity: Not on file  Other Topics Concern   Not on file  Social History Narrative   Lives w/ husband   Caffeine use: none   Right handed   Social Drivers of Health   Financial Resource Strain: Low Risk  (05/05/2023)   Received from [facility]   Overall Financial Resource Strain (CARDIA)    Difficulty of Paying Living Expenses: Not hard at all  Food Insecurity: No Food Insecurity (05/05/2023)   Received from [facility]   Hunger Vital Sign    Worried About Running Out of Food in the Last Year: Never true    Ran Out of Food in the Last Year: Never true  Transportation Needs: No Transportation Needs (05/05/2023)   Received from [facility]    PRAPARE - Transportation    Lack of Transportation (Medical): No    Lack of Transportation (Non-Medical): No  Physical Activity: Inactive (05/05/2023)   Received from [facility]   Exercise Vital Sign    Days of Exercise per Week: 0 days    Minutes of Exercise per Session: 0 min  Stress: Stress Concern Present (05/05/2023)   Received from [facility] Occupational [facility] - Occupational Stress Questionnaire    Feeling of Stress : To some extent  Social Connections: Moderately Integrated (05/05/2023)   Received from [facility]   Social Connection and Isolation Panel [NHANES]    Frequency of Communication with Friends and Family: More than three times a week    Frequency of Social Gatherings with Friends and Family: More than three times a week    Attends Religious Services: More than 4 times per year  Active Member of Clubs or Organizations: No    Attends Banker Meetings: Never    Marital Status: Married    Current Medications:  Current Outpatient Medications:    acetaminophen (TYLENOL) 500 MG tablet, Take 500 mg by mouth every 6 (six) hours as needed (as needed for pain)., Disp: , Rfl:    apixaban (ELIQUIS) 2.5 MG TABS tablet, Take 1 tablet (2.5 mg total) by mouth 2 (two) times daily. For AFTER surgery only, Disp: 28 tablet, Rfl: 0   Cholecalciferol (VITAMIN D) 50 MCG (2000 UT) tablet, Take 2,000-4,000 Units by mouth See admin instructions. Take 2000 units by mouth every other day, alternating with 4000 units on alternate days, Disp: , Rfl:    Dimethyl Fumarate 240 MG CPDR, Take 1 capsule (240 mg total) by mouth 2 (two) times daily., Disp: 180 capsule, Rfl: 1   ondansetron (ZOFRAN) 8 MG tablet, Take 1 tablet (8 mg total) by mouth every 8 (eight) hours as needed for nausea (or vomiting)., Disp: 30 tablet, Rfl: 2   prochlorperazine (COMPAZINE) 10 MG tablet, Take 1 tablet (10 mg total) by mouth every 6 (six) hours as needed (nausea or  vomiting). (Patient not taking: Reported on 05/21/2023), Disp: 30 tablet, Rfl: 2   rosuvastatin (CRESTOR) 10 MG tablet, Take 1 tablet (10 mg total) by mouth daily., Disp: 90 tablet, Rfl: 1   valsartan-hydrochlorothiazide (DIOVAN-HCT) 80-12.5 MG tablet, Take 1 tablet by mouth daily., Disp: 90 tablet, Rfl: 0  Review of Systems: + incontinence Denies appetite changes, fevers, chills, fatigue, unexplained weight changes. Denies hearing loss, neck lumps or masses, mouth sores, ringing in ears or voice changes. Denies cough or wheezing.  Denies shortness of breath. Denies chest pain or palpitations. Denies leg swelling. Denies abdominal distention, pain, blood in stools, constipation, diarrhea, nausea, vomiting, or early satiety. Denies pain with intercourse, dysuria, frequency, hematuria. Denies hot flashes, pelvic pain, vaginal bleeding or vaginal discharge.   Denies joint pain, back pain or muscle pain/cramps. Denies itching, rash, or wounds. Denies dizziness, headaches, numbness or seizures. Denies swollen lymph nodes or glands, denies easy bruising or bleeding. Denies anxiety, depression, confusion, or decreased concentration.  Physical Exam: BP (!) 129/48 (BP Location: Left Arm, Patient Position: Sitting) Comment: Notified RN  Pulse 67   Temp 98.7 F (37.1 C) (Oral)   Resp 18   Wt 211 lb 9.6 oz (96 kg)   SpO2 98%   BMI 34.15 kg/m  General: Alert, oriented, no acute distress. HEENT: Normocephalic, atraumatic, sclera anicteric. Chest: Unlabored breathing on room air. Abdomen: soft, nontender.  Normoactive bowel sounds.  No masses or hepatosplenomegaly appreciated.  Well-healed incisions. Extremities: Grossly normal range of motion.  Warm, well perfused.  No edema bilaterally. GU: Normal appearing external genitalia without erythema, excoriation, or lesions.  Speculum exam reveals cuff intact, suture visible.  Bimanual exam reveals cuff intact, no firmness or nodularity, no fluctuance.     Laboratory & Radiologic Studies: None new  Assessment & Plan: Joan [name] is a 68 y.o. woman at least stage IIA poorly differentiated squamous cell carcinoma of the cervix who presents for follow-up after recent surgery.   Patient is doing very well from a postoperative standpoint.  Discussed continued expectations and restrictions.  Reviewed pathology from surgery.  Discussed pathology report.  Given unexpected cervical cancer diagnosis and positive margins, patient met Peter's criteria.  She has started adjuvant radiation with sensitizing cisplatin.  Plan will be for brachytherapy after she completes pelvic radiation.  Referral placed  for her to establish with Dr. Roselind Messier here for her brachytherapy.  I will plan to see the patient back in 6 months.  We discussed getting imaging 3 months after she finishes treatment. She will also need follow-up imaging of her pancreas at that time.  26 minutes of total time was spent for this patient encounter, including preparation, face-to-face counseling with the patient and coordination of care, and documentation of the encounter.  Eugene Garnet, MD  Division of Gynecologic Oncology  Department of Obstetrics and Gynecology  [facility]

## 2023-05-28 DIAGNOSIS — C538 Malignant neoplasm of overlapping sites of cervix uteri: Secondary | ICD-10-CM | POA: Diagnosis not present

## 2023-05-31 ENCOUNTER — Telehealth: Payer: Self-pay | Admitting: Family Medicine

## 2023-05-31 DIAGNOSIS — C538 Malignant neoplasm of overlapping sites of cervix uteri: Secondary | ICD-10-CM | POA: Diagnosis not present

## 2023-05-31 DIAGNOSIS — R790 Abnormal level of blood mineral: Secondary | ICD-10-CM | POA: Diagnosis not present

## 2023-05-31 DIAGNOSIS — Z510 Encounter for antineoplastic radiation therapy: Secondary | ICD-10-CM | POA: Diagnosis not present

## 2023-05-31 DIAGNOSIS — G35 Multiple sclerosis: Secondary | ICD-10-CM

## 2023-05-31 DIAGNOSIS — C539 Malignant neoplasm of cervix uteri, unspecified: Secondary | ICD-10-CM | POA: Diagnosis not present

## 2023-05-31 MED ORDER — DIMETHYL FUMARATE 240 MG PO CPDR: 1.0000 | DELAYED_RELEASE_CAPSULE | Freq: Two times a day (BID) | ORAL | 1 refills | Status: DC

## 2023-05-31 NOTE — Telephone Encounter (Signed)
 Called and LVM letting pt know refill sent in. E-scribed rx

## 2023-05-31 NOTE — Telephone Encounter (Signed)
 Pt is needing her Dimethyl Fumarate 240 MG CPDR prescription resent to [facility]. Please advise.

## 2023-06-01 ENCOUNTER — Telehealth: Payer: Self-pay | Admitting: Radiation Oncology

## 2023-06-01 DIAGNOSIS — C538 Malignant neoplasm of overlapping sites of cervix uteri: Secondary | ICD-10-CM | POA: Diagnosis not present

## 2023-06-01 DIAGNOSIS — Z5111 Encounter for antineoplastic chemotherapy: Secondary | ICD-10-CM | POA: Diagnosis not present

## 2023-06-01 DIAGNOSIS — C539 Malignant neoplasm of cervix uteri, unspecified: Secondary | ICD-10-CM | POA: Diagnosis not present

## 2023-06-01 DIAGNOSIS — R790 Abnormal level of blood mineral: Secondary | ICD-10-CM | POA: Diagnosis not present

## 2023-06-01 DIAGNOSIS — Z510 Encounter for antineoplastic radiation therapy: Secondary | ICD-10-CM | POA: Diagnosis not present

## 2023-06-01 NOTE — Telephone Encounter (Signed)
 Called patient to schedule a consultation w. Dr. Roselind Messier. Patient is currently receiving TX in [place], prefers for consult to be after final TX. Scheduled as advised.

## 2023-06-02 DIAGNOSIS — Z510 Encounter for antineoplastic radiation therapy: Secondary | ICD-10-CM | POA: Diagnosis not present

## 2023-06-02 DIAGNOSIS — C538 Malignant neoplasm of overlapping sites of cervix uteri: Secondary | ICD-10-CM | POA: Diagnosis not present

## 2023-06-03 ENCOUNTER — Telehealth: Payer: Self-pay | Admitting: *Deleted

## 2023-06-03 DIAGNOSIS — C538 Malignant neoplasm of overlapping sites of cervix uteri: Secondary | ICD-10-CM | POA: Diagnosis not present

## 2023-06-03 NOTE — Telephone Encounter (Signed)
 CALLED PATIENT TO INFORM OF NEW HDR [facility], SPOKE WITH PATIENT AND SHE IS AWARE OF THESE APPTS.

## 2023-06-04 DIAGNOSIS — I10 Essential (primary) hypertension: Secondary | ICD-10-CM | POA: Diagnosis not present

## 2023-06-04 DIAGNOSIS — R1013 Epigastric pain: Secondary | ICD-10-CM | POA: Diagnosis not present

## 2023-06-04 DIAGNOSIS — Z87891 Personal history of nicotine dependence: Secondary | ICD-10-CM | POA: Diagnosis not present

## 2023-06-04 DIAGNOSIS — K429 Umbilical hernia without obstruction or gangrene: Secondary | ICD-10-CM | POA: Diagnosis not present

## 2023-06-04 DIAGNOSIS — E785 Hyperlipidemia, unspecified: Secondary | ICD-10-CM | POA: Diagnosis not present

## 2023-06-04 DIAGNOSIS — R531 Weakness: Secondary | ICD-10-CM | POA: Diagnosis not present

## 2023-06-04 DIAGNOSIS — R109 Unspecified abdominal pain: Secondary | ICD-10-CM | POA: Diagnosis not present

## 2023-06-04 DIAGNOSIS — N281 Cyst of kidney, acquired: Secondary | ICD-10-CM | POA: Diagnosis not present

## 2023-06-04 DIAGNOSIS — J449 Chronic obstructive pulmonary disease, unspecified: Secondary | ICD-10-CM | POA: Diagnosis not present

## 2023-06-04 DIAGNOSIS — I2510 Atherosclerotic heart disease of native coronary artery without angina pectoris: Secondary | ICD-10-CM | POA: Diagnosis not present

## 2023-06-04 DIAGNOSIS — R5383 Other fatigue: Secondary | ICD-10-CM | POA: Diagnosis not present

## 2023-06-04 DIAGNOSIS — Z90710 Acquired absence of both cervix and uterus: Secondary | ICD-10-CM | POA: Diagnosis not present

## 2023-06-07 DIAGNOSIS — R7303 Prediabetes: Secondary | ICD-10-CM | POA: Diagnosis not present

## 2023-06-07 DIAGNOSIS — C538 Malignant neoplasm of overlapping sites of cervix uteri: Secondary | ICD-10-CM | POA: Diagnosis not present

## 2023-06-07 DIAGNOSIS — E782 Mixed hyperlipidemia: Secondary | ICD-10-CM | POA: Diagnosis not present

## 2023-06-08 DIAGNOSIS — C538 Malignant neoplasm of overlapping sites of cervix uteri: Secondary | ICD-10-CM | POA: Diagnosis not present

## 2023-06-08 DIAGNOSIS — R790 Abnormal level of blood mineral: Secondary | ICD-10-CM | POA: Diagnosis not present

## 2023-06-08 DIAGNOSIS — Z510 Encounter for antineoplastic radiation therapy: Secondary | ICD-10-CM | POA: Diagnosis not present

## 2023-06-08 DIAGNOSIS — C539 Malignant neoplasm of cervix uteri, unspecified: Secondary | ICD-10-CM | POA: Diagnosis not present

## 2023-06-09 DIAGNOSIS — Z510 Encounter for antineoplastic radiation therapy: Secondary | ICD-10-CM | POA: Diagnosis not present

## 2023-06-09 DIAGNOSIS — Z923 Personal history of irradiation: Secondary | ICD-10-CM | POA: Diagnosis not present

## 2023-06-09 DIAGNOSIS — Z5111 Encounter for antineoplastic chemotherapy: Secondary | ICD-10-CM | POA: Diagnosis not present

## 2023-06-09 DIAGNOSIS — Z79899 Other long term (current) drug therapy: Secondary | ICD-10-CM | POA: Diagnosis not present

## 2023-06-09 DIAGNOSIS — C539 Malignant neoplasm of cervix uteri, unspecified: Secondary | ICD-10-CM | POA: Diagnosis not present

## 2023-06-09 DIAGNOSIS — C538 Malignant neoplasm of overlapping sites of cervix uteri: Secondary | ICD-10-CM | POA: Diagnosis not present

## 2023-06-10 DIAGNOSIS — C538 Malignant neoplasm of overlapping sites of cervix uteri: Secondary | ICD-10-CM | POA: Diagnosis not present

## 2023-06-10 DIAGNOSIS — Z510 Encounter for antineoplastic radiation therapy: Secondary | ICD-10-CM | POA: Diagnosis not present

## 2023-06-11 DIAGNOSIS — C538 Malignant neoplasm of overlapping sites of cervix uteri: Secondary | ICD-10-CM | POA: Diagnosis not present

## 2023-06-14 DIAGNOSIS — Z510 Encounter for antineoplastic radiation therapy: Secondary | ICD-10-CM | POA: Diagnosis not present

## 2023-06-14 DIAGNOSIS — C538 Malignant neoplasm of overlapping sites of cervix uteri: Secondary | ICD-10-CM | POA: Diagnosis not present

## 2023-06-15 DIAGNOSIS — R790 Abnormal level of blood mineral: Secondary | ICD-10-CM | POA: Diagnosis not present

## 2023-06-15 DIAGNOSIS — C539 Malignant neoplasm of cervix uteri, unspecified: Secondary | ICD-10-CM | POA: Diagnosis not present

## 2023-06-15 DIAGNOSIS — Z510 Encounter for antineoplastic radiation therapy: Secondary | ICD-10-CM | POA: Diagnosis not present

## 2023-06-15 DIAGNOSIS — C538 Malignant neoplasm of overlapping sites of cervix uteri: Secondary | ICD-10-CM | POA: Diagnosis not present

## 2023-06-16 DIAGNOSIS — C538 Malignant neoplasm of overlapping sites of cervix uteri: Secondary | ICD-10-CM | POA: Diagnosis not present

## 2023-06-16 DIAGNOSIS — Z510 Encounter for antineoplastic radiation therapy: Secondary | ICD-10-CM | POA: Diagnosis not present

## 2023-06-16 DIAGNOSIS — Z95828 Presence of other vascular implants and grafts: Secondary | ICD-10-CM | POA: Diagnosis not present

## 2023-06-16 DIAGNOSIS — Z5111 Encounter for antineoplastic chemotherapy: Secondary | ICD-10-CM | POA: Diagnosis not present

## 2023-06-16 DIAGNOSIS — R790 Abnormal level of blood mineral: Secondary | ICD-10-CM | POA: Diagnosis not present

## 2023-06-16 DIAGNOSIS — C539 Malignant neoplasm of cervix uteri, unspecified: Secondary | ICD-10-CM | POA: Diagnosis not present

## 2023-06-17 DIAGNOSIS — I700 Atherosclerosis of aorta: Secondary | ICD-10-CM | POA: Diagnosis not present

## 2023-06-17 DIAGNOSIS — I10 Essential (primary) hypertension: Secondary | ICD-10-CM | POA: Diagnosis not present

## 2023-06-17 DIAGNOSIS — E559 Vitamin D deficiency, unspecified: Secondary | ICD-10-CM | POA: Diagnosis not present

## 2023-06-17 DIAGNOSIS — Z510 Encounter for antineoplastic radiation therapy: Secondary | ICD-10-CM | POA: Diagnosis not present

## 2023-06-17 DIAGNOSIS — F411 Generalized anxiety disorder: Secondary | ICD-10-CM | POA: Diagnosis not present

## 2023-06-17 DIAGNOSIS — E782 Mixed hyperlipidemia: Secondary | ICD-10-CM | POA: Diagnosis not present

## 2023-06-17 DIAGNOSIS — D531 Other megaloblastic anemias, not elsewhere classified: Secondary | ICD-10-CM | POA: Diagnosis not present

## 2023-06-17 DIAGNOSIS — Z87891 Personal history of nicotine dependence: Secondary | ICD-10-CM | POA: Diagnosis not present

## 2023-06-17 DIAGNOSIS — R7303 Prediabetes: Secondary | ICD-10-CM | POA: Diagnosis not present

## 2023-06-17 DIAGNOSIS — G35 Multiple sclerosis: Secondary | ICD-10-CM | POA: Diagnosis not present

## 2023-06-17 DIAGNOSIS — R790 Abnormal level of blood mineral: Secondary | ICD-10-CM | POA: Diagnosis not present

## 2023-06-17 DIAGNOSIS — C538 Malignant neoplasm of overlapping sites of cervix uteri: Secondary | ICD-10-CM | POA: Diagnosis not present

## 2023-06-17 DIAGNOSIS — M8580 Other specified disorders of bone density and structure, unspecified site: Secondary | ICD-10-CM | POA: Diagnosis not present

## 2023-06-17 DIAGNOSIS — C539 Malignant neoplasm of cervix uteri, unspecified: Secondary | ICD-10-CM | POA: Diagnosis not present

## 2023-06-17 DIAGNOSIS — R809 Proteinuria, unspecified: Secondary | ICD-10-CM | POA: Diagnosis not present

## 2023-06-17 DIAGNOSIS — D126 Benign neoplasm of colon, unspecified: Secondary | ICD-10-CM | POA: Diagnosis not present

## 2023-06-18 DIAGNOSIS — C538 Malignant neoplasm of overlapping sites of cervix uteri: Secondary | ICD-10-CM | POA: Diagnosis not present

## 2023-06-21 DIAGNOSIS — C538 Malignant neoplasm of overlapping sites of cervix uteri: Secondary | ICD-10-CM | POA: Diagnosis not present

## 2023-06-22 DIAGNOSIS — Z510 Encounter for antineoplastic radiation therapy: Secondary | ICD-10-CM | POA: Diagnosis not present

## 2023-06-22 DIAGNOSIS — R790 Abnormal level of blood mineral: Secondary | ICD-10-CM | POA: Diagnosis not present

## 2023-06-22 DIAGNOSIS — C538 Malignant neoplasm of overlapping sites of cervix uteri: Secondary | ICD-10-CM | POA: Diagnosis not present

## 2023-06-22 DIAGNOSIS — C539 Malignant neoplasm of cervix uteri, unspecified: Secondary | ICD-10-CM | POA: Diagnosis not present

## 2023-06-23 DIAGNOSIS — Z95828 Presence of other vascular implants and grafts: Secondary | ICD-10-CM | POA: Diagnosis not present

## 2023-06-23 DIAGNOSIS — C538 Malignant neoplasm of overlapping sites of cervix uteri: Secondary | ICD-10-CM | POA: Diagnosis not present

## 2023-06-23 DIAGNOSIS — Z5111 Encounter for antineoplastic chemotherapy: Secondary | ICD-10-CM | POA: Diagnosis not present

## 2023-06-23 DIAGNOSIS — Z510 Encounter for antineoplastic radiation therapy: Secondary | ICD-10-CM | POA: Diagnosis not present

## 2023-06-23 DIAGNOSIS — R790 Abnormal level of blood mineral: Secondary | ICD-10-CM | POA: Diagnosis not present

## 2023-06-23 DIAGNOSIS — C539 Malignant neoplasm of cervix uteri, unspecified: Secondary | ICD-10-CM | POA: Diagnosis not present

## 2023-06-24 DIAGNOSIS — C539 Malignant neoplasm of cervix uteri, unspecified: Secondary | ICD-10-CM | POA: Diagnosis not present

## 2023-06-24 DIAGNOSIS — R790 Abnormal level of blood mineral: Secondary | ICD-10-CM | POA: Diagnosis not present

## 2023-06-24 DIAGNOSIS — C538 Malignant neoplasm of overlapping sites of cervix uteri: Secondary | ICD-10-CM | POA: Diagnosis not present

## 2023-06-24 DIAGNOSIS — Z510 Encounter for antineoplastic radiation therapy: Secondary | ICD-10-CM | POA: Diagnosis not present

## 2023-06-25 DIAGNOSIS — C538 Malignant neoplasm of overlapping sites of cervix uteri: Secondary | ICD-10-CM | POA: Diagnosis not present

## 2023-06-28 DIAGNOSIS — C538 Malignant neoplasm of overlapping sites of cervix uteri: Secondary | ICD-10-CM | POA: Diagnosis not present

## 2023-06-29 DIAGNOSIS — C539 Malignant neoplasm of cervix uteri, unspecified: Secondary | ICD-10-CM | POA: Diagnosis not present

## 2023-06-29 DIAGNOSIS — C538 Malignant neoplasm of overlapping sites of cervix uteri: Secondary | ICD-10-CM | POA: Diagnosis not present

## 2023-06-29 DIAGNOSIS — R790 Abnormal level of blood mineral: Secondary | ICD-10-CM | POA: Diagnosis not present

## 2023-06-29 DIAGNOSIS — Z510 Encounter for antineoplastic radiation therapy: Secondary | ICD-10-CM | POA: Diagnosis not present

## 2023-06-30 DIAGNOSIS — C539 Malignant neoplasm of cervix uteri, unspecified: Secondary | ICD-10-CM | POA: Diagnosis not present

## 2023-06-30 DIAGNOSIS — C538 Malignant neoplasm of overlapping sites of cervix uteri: Secondary | ICD-10-CM | POA: Diagnosis not present

## 2023-06-30 DIAGNOSIS — Z510 Encounter for antineoplastic radiation therapy: Secondary | ICD-10-CM | POA: Diagnosis not present

## 2023-06-30 DIAGNOSIS — Z5111 Encounter for antineoplastic chemotherapy: Secondary | ICD-10-CM | POA: Diagnosis not present

## 2023-06-30 DIAGNOSIS — R790 Abnormal level of blood mineral: Secondary | ICD-10-CM | POA: Diagnosis not present

## 2023-07-02 ENCOUNTER — Telehealth: Payer: Self-pay | Admitting: *Deleted

## 2023-07-02 NOTE — Progress Notes (Signed)
 Joan [name] is here today for new vaginal cylinder fitting.  They completed their radiation on: 06/30/2023  Does the patient complain of any of the following:  Pain:Denies Abdominal bloating: Denies Diarrhea/Constipation: Diarrhea Nausea/Vomiting: Denies Vaginal Discharge: Denies Blood in Urine or Stool: Denies Urinary Issues (dysuria/incomplete emptying/ incontinence/ increased frequency/urgency): Incontinence   BP 119/81 (BP Location: Left Arm, Patient Position: Sitting)   Pulse 84   Temp (!) 96.4 F (35.8 C) (Temporal)   Resp 18   Ht 5\' 6"  (1.676 m)   Wt 205 lb 2 oz (93 kg)   SpO2 99%   BMI 33.11 kg/m

## 2023-07-02 NOTE — Telephone Encounter (Signed)
 CALLED PATIENT TO REMIND OF APPTS. FOR MONDAY MARCH 24- ARRIVAL TIME- 8:15 AM @ [facility], SPOKE WITH PATIENT AND SHE IS AWARE OF THESE APPTS.

## 2023-07-03 NOTE — Progress Notes (Signed)
 Radiation Oncology         (336) [phone] ________________________________  Initial Outpatient Consultation  Name: Joan [name] MRN: [id number]  Date: 07/05/2023  DOB: 08-14-1955  WU:JWJX, Kathleene Hazel, MD  Carver Fila, MD   REFERRING PHYSICIAN: Carver Fila, MD  DIAGNOSIS: There were no encounter diagnoses.  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 female who is accompanied by ***. she is seen as a courtesy of Dr. Pricilla Holm for an opinion concerning brachytherapy as part of management for her recently diagnosed cervical cancer.   The patient presented to her PCP this past November (2024) with c/o new onset postmenopausal spotting. A urine sample was sent for urinalysis at that time and she was found to have blood in her urine. A CT AP was subsequently performed on 03/05/23 which demonstrated: no specific urinary tract abnormality to account for her hematuria. CT findings however revealed abnormal fluid density widening of the endometrial stripe measuring approximately 2.2 cm. A known fluid density lesion in the head of the pancreas measuring 1.7 x 0.9 x 1.5 cm was also demonstrated, and appeared stable when compared to her prior MRI from 12/2021.   A pelvic ultrasound was then performed on 03/17/23 which demonstrated: an anteverted uterus measuring 8.0 x 5.3 x 6.0 cm containing a cystic area measuring 4.4 x 3.4 cm occupying much of the uterus, and a 2.5 x 1.7 x 1.7 cm hyperechoic lesion with irregular borders within the cervix which appeared to demonstrate multiple echogenic foci and internal vascularity. The  endometrium and bilateral ovaries were not visualized. Of note: she has significant drainage of fluid after her endometrial biopsy consisting of an at least 20 minute episode of vaginal bleeding.   She was accordingly referred to urology and gynecology and underwent an endometrial biopsy on 04/01/23 which showed poorly differentiated carcinoma, consistent with squamous cell carcinoma; p16 positive; ER negative; Proliferation marker Ki67 at >95%; MMR intact. A Pap was also collected that same date and showed atypical glandular cells, favoring a neoplastic etiology.   She was subsequently referred to Dr. Pricilla Holm on 04/16/23 for further management. During which time, the patient endorsed some urinary urgency, incontinence, and nocturia, all of which had improved since her endometrial biopsy and subsequent fluid draining.   Based on Dr. Winferd Humphrey recommendation, she opted to proceed with a hysterectomy, BSO, omentectomy, and SLN evaluation on 04/21/23. Pathology from the procedure revealed: tumor the size 4.1 cm; histology of poorly differentiated high grade invasive squamous cell carcinoma of the cervix with extensive lymphatic invasion and invading a depth of 1.3 cm, focally involving the ectocervical/vaginal margin, invading the deep cervical margin by 0.6 mm, and circumferentially involving the cervix, endocervix and lower uterine segment, within a background of severe squamous dysplasia to carcinoma in situ (HSIL, CIN-3); bilateral fallopian tubes, bilateral ovaries, and proliferative phase endometrium negative for carcinoma; nodal status of 2/2 pelvic SLNs and 1/1 non-sentinel pelvic lymph node all negative for carcinoma. Pelvic washings were also submitted for cytology and came back negative for malignancy.   Based on her histology meeting Noe Gens criteria, Dr. Pricilla Holm recommended pelvic radiation therapy along with sensitizing cisplatin and she was accordingly referred to the [facility] cancer center  to  initiate therapy. She received her final dose of pelvic radiation therapy on 06/30/23 (detailed below). She also finished her 6th and finale cycle of chemotherapy consisting of cisplatin on 06/30/23. Based on her most recent follow-up with [facility] medical oncology on 03/18, she tolerated systemic treatment relatively well other than lethargy and fatigue that would last for approximately 72 hours after each infusion. She also had an episode of stomach pain with intense diarrhea on 02/21 and was advised by med-onc to present to the ED. It is unclear if she received treatment for this in the ED as encounter details for that date are limited at this time.    PREVIOUS RADIATION THERAPY: Yes   Concurrent chemoradiation to the pelvis under the care of Dr. Langston Masker - [facility]iation Oncology: Site: Pelvis  Radiation treatment dates: 05/26/23 through 06/30/23  Treatment intent: Curative  Dose: 45 Gy delivered in 25 Fx at 1.8 Gy/Fx Technique: IMRT-VMAT  PAST MEDICAL HISTORY:  Past Medical History:  Diagnosis Date   Cancer [facility])    Endometrial   Colitis, ischemic (HCC) 2003   Headaches, cluster    Hypercholesterolemia    Hypertension    Insomnia    Multiple sclerosis (HCC) 05/29/2018   Neuromuscular disorder (HCC)    Multiple Sclerosis   PE (pulmonary embolism)    ?birth control, in 48s   PONV (postoperative nausea and vomiting)    S/P colonoscopy 1610,9604   2004: tubular adeboma, 2007: hyperplastic polyps. Due 2012   Seasonal allergies    Tobacco abuse    Tubular adenoma 2004   colonoscopy 2004    PAST SURGICAL HISTORY: Past Surgical History:  Procedure Laterality Date   BREAST BIOPSY Left    benign   COLONOSCOPY  09/17/05   20 cm polyp removed/bx/middescending colon polyp removed with snare   COLONOSCOPY  2003   tubular adenoma removed from cecum    COLONOSCOPY  03/31/2011   RMR, diverticulosis, multiple colon polyps removed (tubular adenomas). next TCS 03/2016    COLONOSCOPY N/A 10/22/2015   Procedure: COLONOSCOPY;  Surgeon: Corbin Ade, MD;  [facility];  Service: Endoscopy;  Laterality: N/A;  730   COLONOSCOPY N/A 02/05/2021   Procedure: COLONOSCOPY;  Surgeon: Corbin Ade, MD;  Location: [facility];  Service: Endoscopy;  Laterality: N/A;  ASA II / 9:30   ESOPHAGOGASTRODUODENOSCOPY N/A 02/15/2015   RMR, mild reflux esophagitis. empirical dilation of esophagus.    LYMPH NODE DISSECTION N/A 04/21/2023   Procedure: LYMPH NODE DISSECTION;  Surgeon: Carver Fila, MD;  Location: [facility];  Service: Gynecology;  Laterality: N/A;   MALONEY DILATION N/A 02/15/2015   Procedure: Elease Hashimoto DILATION;  Surgeon: Corbin Ade, MD;  Location: [facility];  Service: Endoscopy;  Laterality: N/A;   POLYPECTOMY  02/05/2021   Procedure: POLYPECTOMY;  Surgeon: Corbin Ade, MD;  Location: [facility];  Service: Endoscopy;;   ROBOTIC ASSISTED TOTAL HYSTERECTOMY WITH BILATERAL SALPINGO OOPHERECTOMY Bilateral 04/21/2023   Procedure: XI ROBOTIC ASSISTED TOTAL HYSTERECTOMY WITH BILATERAL SALPINGO OOPHORECTOMY; OMENTECTOMY;  Surgeon: Carver Fila, MD;  Location: [facility];  Service: Gynecology;  Laterality: Bilateral;   SENTINEL NODE BIOPSY N/A 04/21/2023   Procedure: SENTINEL NODE BIOPSY;  Surgeon: Carver Fila, MD;  Location: [facility];  Service: Gynecology;  Laterality: N/A;   TUBAL LIGATION      FAMILY HISTORY:  Family History  Problem Relation Age of Onset   CVA Father    Hypertension Father  Breast cancer Maternal Aunt    Breast cancer Paternal Aunt    Colon cancer Neg Hx    Ovarian cancer Neg Hx    Endometrial cancer Neg Hx    Pancreatic cancer Neg Hx    Prostate cancer Neg Hx     SOCIAL HISTORY:  Social History   Tobacco Use   Smoking status: Every Day    Current packs/day: 0.50    Average packs/day: 0.5 packs/day for 30.0 years (15.0 ttl pk-yrs)    Types: Cigarettes   Smokeless tobacco: Never  Vaping Use   Vaping  status: Some Days  Substance Use Topics   Alcohol use: No    Alcohol/week: 0.0 standard drinks of alcohol   Drug use: No    ALLERGIES: No Known Allergies  MEDICATIONS:  Current Outpatient Medications  Medication Sig Dispense Refill   acetaminophen (TYLENOL) 500 MG tablet Take 500 mg by mouth every 6 (six) hours as needed (as needed for pain).     apixaban (ELIQUIS) 2.5 MG TABS tablet Take 1 tablet (2.5 mg total) by mouth 2 (two) times daily. For AFTER surgery only 28 tablet 0   Cholecalciferol (VITAMIN D) 50 MCG (2000 UT) tablet Take 2,000-4,000 Units by mouth See admin instructions. Take 2000 units by mouth every other day, alternating with 4000 units on alternate days     Dimethyl Fumarate 240 MG CPDR Take 1 capsule (240 mg total) by mouth 2 (two) times daily. 180 capsule 1   ondansetron (ZOFRAN) 8 MG tablet Take 1 tablet (8 mg total) by mouth every 8 (eight) hours as needed for nausea (or vomiting). 30 tablet 2   prochlorperazine (COMPAZINE) 10 MG tablet Take 1 tablet (10 mg total) by mouth every 6 (six) hours as needed (nausea or vomiting). (Patient not taking: Reported on 05/21/2023) 30 tablet 2   rosuvastatin (CRESTOR) 10 MG tablet Take 1 tablet (10 mg total) by mouth daily. 90 tablet 1   valsartan-hydrochlorothiazide (DIOVAN-HCT) 80-12.5 MG tablet Take 1 tablet by mouth daily. 90 tablet 0   No current facility-administered medications for this encounter.    REVIEW OF SYSTEMS:  A 10+ POINT REVIEW OF SYSTEMS WAS OBTAINED including neurology, dermatology, psychiatry, cardiac, respiratory, lymph, extremities, GI, GU, musculoskeletal, constitutional, reproductive, HEENT. ***   PHYSICAL EXAM:  vitals were not taken for this visit.   General: Alert and oriented, in no acute distress HEENT: Head is normocephalic. Extraocular movements are intact. Oropharynx is clear. Neck: Neck is supple, no palpable cervical or supraclavicular lymphadenopathy. Heart: Regular in rate and rhythm with no  murmurs, rubs, or gallops. Chest: Clear to auscultation bilaterally, with no rhonchi, wheezes, or rales. Abdomen: Soft, nontender, nondistended, with no rigidity or guarding. Extremities: No cyanosis or edema. Lymphatics: see Neck Exam Skin: No concerning lesions. Musculoskeletal: symmetric strength and muscle tone throughout. Neurologic: Cranial nerves II through XII are grossly intact. No obvious focalities. Speech is fluent. Coordination is intact. Psychiatric: Judgment and insight are intact. Affect is appropriate.  On pelvic examination the external genitalia were unremarkable. A speculum exam was performed. There are no mucosal lesions noted in the vaginal vault. A Pap smear was obtained of the proximal vagina. On bimanual and rectovaginal examination there were no pelvic masses appreciated. ***   ECOG = ***  0 - Asymptomatic (Fully active, able to carry on all predisease activities without restriction)  1 - Symptomatic but completely ambulatory (Restricted in physically strenuous activity but ambulatory and able to carry out work of a light or  sedentary nature. For example, light housework, office work)  2 - Symptomatic, <50% in bed during the day (Ambulatory and capable of all self care but unable to carry out any work activities. Up and about more than 50% of waking hours)  3 - Symptomatic, >50% in bed, but not bedbound (Capable of only limited self-care, confined to bed or chair 50% or more of waking hours)  4 - Bedbound (Completely disabled. Cannot carry on any self-care. Totally confined to bed or chair)  5 - Death   Santiago Glad MM, Creech RH, Tormey DC, et al. [phone]). "Toxicity and response criteria of the [facility]". Am. Evlyn Clines. Oncol. 5 (6): 649-55  LABORATORY DATA:  Lab Results  Component Value Date   WBC 13.7 (H) 04/22/2023   HGB 12.3 04/22/2023   HCT 36.1 04/22/2023   MCV 100.3 (H) 04/22/2023   PLT 229 04/22/2023   NEUTROABS 6.5 05/28/2018    Lab Results  Component Value Date   NA 134 (L) 04/22/2023   K 3.6 04/22/2023   CL 100 04/22/2023   CO2 23 04/22/2023   GLUCOSE 122 (H) 04/22/2023   BUN 14 04/22/2023   CREATININE 0.58 04/22/2023   CALCIUM 8.4 (L) 04/22/2023      RADIOGRAPHY: No results found.    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  We discussed the natural history of *** and general treatment, highlighting the role of radiotherapy in the management.  We discussed the available radiation techniques, and focused on the details of logistics and delivery.  We reviewed the anticipated acute and late sequelae associated with radiation in this setting.  The patient was encouraged to ask questions that I answered to the best of my ability. *** A patient consent form was discussed and signed.  We retained a copy for our records.  The patient would like to proceed with radiation and will be scheduled for CT simulation.  PLAN: ***    *** minutes of total time was spent for this patient encounter, including preparation, face-to-face counseling with the patient and coordination of care, physical exam, and documentation of the encounter.   ------------------------------------------------  Billie Lade, PhD, MD  This document serves as a record of services personally performed by Antony Blackbird, MD. It was created on his behalf by Neena Rhymes, a trained medical scribe. The creation of this record is based on the scribe's personal observations and the provider's statements to them. This document has been  checked and approved by the attending provider.

## 2023-07-05 ENCOUNTER — Ambulatory Visit: Payer: PPO | Admitting: Radiation Oncology

## 2023-07-05 ENCOUNTER — Ambulatory Visit
Admission: RE | Admit: 2023-07-05 | Discharge: 2023-07-05 | Disposition: A | Discharge status: Home / Self Care | Payer: PPO | Source: Ambulatory Visit | Attending: Radiation Oncology | Admitting: Radiation Oncology

## 2023-07-05 ENCOUNTER — Ambulatory Visit: Payer: PPO

## 2023-07-05 ENCOUNTER — Encounter: Payer: Self-pay | Admitting: Radiation Oncology

## 2023-07-05 ENCOUNTER — Other Ambulatory Visit: Payer: Self-pay

## 2023-07-05 VITALS — BP 119/81 | HR 84 | Temp 96.4°F | Resp 18 | Ht 66.0 in | Wt 205.1 lb

## 2023-07-05 DIAGNOSIS — C541 Malignant neoplasm of endometrium: Secondary | ICD-10-CM | POA: Diagnosis not present

## 2023-07-05 DIAGNOSIS — I10 Essential (primary) hypertension: Secondary | ICD-10-CM | POA: Insufficient documentation

## 2023-07-05 DIAGNOSIS — F17210 Nicotine dependence, cigarettes, uncomplicated: Secondary | ICD-10-CM | POA: Diagnosis not present

## 2023-07-05 DIAGNOSIS — Z800 Family history of malignant neoplasm of digestive organs: Secondary | ICD-10-CM | POA: Insufficient documentation

## 2023-07-05 DIAGNOSIS — G35 Multiple sclerosis: Secondary | ICD-10-CM | POA: Diagnosis not present

## 2023-07-05 DIAGNOSIS — Z90710 Acquired absence of both cervix and uterus: Secondary | ICD-10-CM | POA: Diagnosis not present

## 2023-07-05 DIAGNOSIS — C530 Malignant neoplasm of endocervix: Secondary | ICD-10-CM

## 2023-07-05 DIAGNOSIS — Z8041 Family history of malignant neoplasm of ovary: Secondary | ICD-10-CM | POA: Diagnosis not present

## 2023-07-05 DIAGNOSIS — Z803 Family history of malignant neoplasm of breast: Secondary | ICD-10-CM | POA: Diagnosis not present

## 2023-07-05 DIAGNOSIS — Z7901 Long term (current) use of anticoagulants: Secondary | ICD-10-CM | POA: Insufficient documentation

## 2023-07-05 DIAGNOSIS — Z90722 Acquired absence of ovaries, bilateral: Secondary | ICD-10-CM | POA: Diagnosis not present

## 2023-07-05 DIAGNOSIS — Z79899 Other long term (current) drug therapy: Secondary | ICD-10-CM | POA: Insufficient documentation

## 2023-07-05 DIAGNOSIS — Z86711 Personal history of pulmonary embolism: Secondary | ICD-10-CM | POA: Diagnosis not present

## 2023-07-05 DIAGNOSIS — E7800 Pure hypercholesterolemia, unspecified: Secondary | ICD-10-CM | POA: Diagnosis not present

## 2023-07-05 DIAGNOSIS — R319 Hematuria, unspecified: Secondary | ICD-10-CM | POA: Insufficient documentation

## 2023-07-05 LAB — RAD ONC ARIA SESSION SUMMARY
Course Elapsed Days: 0
Plan Fractions Treated to Date: 1
Plan Prescribed Dose Per Fraction: 6 Gy
Plan Total Fractions Prescribed: 4
Plan Total Prescribed Dose: 24 Gy
Reference Point Dosage Given to Date: 6 Gy
Reference Point Session Dosage Given: 6 Gy
Session Number: 1

## 2023-07-05 NOTE — Progress Notes (Signed)
  Radiation Oncology         (336) [phone] ________________________________  Name: [name] MRN: [id number]  Date: 07/05/2023  DOB: November 09, 1955  CC: Benita Stabile, MD  Carver Fila, MD  HDR BRACHYTHERAPY NOTE  DIAGNOSIS: Stage IB poorly differentiated carcinoma of the cervix    Simple treatment device note: Patient had construction of her custom vaginal cylinder. She will be treated with a 3.0 cm diameter segmented cylinder. This conforms to her anatomy without undue discomfort.  Vaginal brachytherapy procedure node: The patient was brought to the HDR suite. Identity was confirmed. All relevant records and images related to the planned course of therapy were reviewed. The patient freely provided informed written consent to proceed with treatment after reviewing the details related to the planned course of therapy. The consent form was witnessed and verified by the simulation staff. Then, the patient was set-up in a stable reproducible supine position for radiation therapy. Pelvic exam revealed the vaginal cuff to be intact . The patient's custom vaginal cylinder was placed in the proximal vagina. This was affixed to the CT/MR stabilization plate to prevent slippage. Patient tolerated the placement well.  Verification simulation note:  A fiducial marker was placed within the vaginal cylinder. An AP and lateral film was then obtained through the pelvis area. This documented accurate position of the vaginal cylinder for treatment.  HDR BRACHYTHERAPY TREATMENT  The remote afterloading device was affixed to the vaginal cylinder by catheter. Patient then proceeded to undergo her first high-dose-rate treatment directed at the proximal vagina. The patient was prescribed a dose of 6.0 gray to be delivered to the mucosal surface. Treatment length was 2.5 cm. Patient was treated with 1 channel using 6 dwell positions. Treatment time was 322.4 seconds. Iridium 192 was the high-dose-rate source  for treatment. The patient tolerated the treatment well. After completion of her therapy, a radiation survey was performed documenting return of the iridium source into the [employer] safe.   PLAN: The patient will return next week for her second high-dose-rate treatment  ________________________________  -----------------------------------  Billie Lade, PhD, MD  This document serves as a record of services personally performed by Antony Blackbird, MD. It was created on his behalf by Neena Rhymes, a trained medical scribe. The creation of this record is based on the scribe's personal observations and the provider's statements to them. This document has been checked and approved by the attending provider.

## 2023-07-06 ENCOUNTER — Telehealth: Payer: Self-pay | Admitting: *Deleted

## 2023-07-06 NOTE — Telephone Encounter (Signed)
 CALLED PATIENT TO INFORM OF THE REST OF HER HDR TXS., LVM FOR A RETURN CALL

## 2023-07-09 ENCOUNTER — Telehealth: Payer: Self-pay | Admitting: *Deleted

## 2023-07-09 NOTE — Telephone Encounter (Signed)
 Called patient to remind of HDR TX. for 07-12-23 @ 3 pm, spoke with patient and she is aware of this tx.

## 2023-07-12 ENCOUNTER — Ambulatory Visit
Admission: RE | Admit: 2023-07-12 | Discharge: 2023-07-12 | Disposition: A | Discharge status: Home / Self Care | Source: Ambulatory Visit | Attending: Radiation Oncology | Admitting: Radiation Oncology

## 2023-07-12 ENCOUNTER — Other Ambulatory Visit (HOSPITAL_COMMUNITY): Payer: Self-pay

## 2023-07-12 ENCOUNTER — Other Ambulatory Visit: Payer: Self-pay

## 2023-07-12 DIAGNOSIS — C530 Malignant neoplasm of endocervix: Secondary | ICD-10-CM

## 2023-07-12 DIAGNOSIS — C541 Malignant neoplasm of endometrium: Secondary | ICD-10-CM | POA: Diagnosis not present

## 2023-07-12 DIAGNOSIS — R319 Hematuria, unspecified: Secondary | ICD-10-CM

## 2023-07-12 LAB — CBC WITH DIFFERENTIAL (CANCER CENTER ONLY)
Abs Immature Granulocytes: 0.01 10*3/uL (ref 0.00–0.07)
Basophils Absolute: 0 10*3/uL (ref 0.0–0.1)
Basophils Relative: 0 %
Eosinophils Absolute: 0 10*3/uL (ref 0.0–0.5)
Eosinophils Relative: 1 %
HCT: 28.6 % — ABNORMAL LOW (ref 36.0–46.0)
Hemoglobin: 10.2 g/dL — ABNORMAL LOW (ref 12.0–15.0)
Immature Granulocytes: 0 %
Lymphocytes Relative: 9 %
Lymphs Abs: 0.3 10*3/uL — ABNORMAL LOW (ref 0.7–4.0)
MCH: 34.7 pg — ABNORMAL HIGH (ref 26.0–34.0)
MCHC: 35.7 g/dL (ref 30.0–36.0)
MCV: 97.3 fL (ref 80.0–100.0)
Monocytes Absolute: 0.3 10*3/uL (ref 0.1–1.0)
Monocytes Relative: 10 %
Neutro Abs: 2.2 10*3/uL (ref 1.7–7.7)
Neutrophils Relative %: 80 %
Platelet Count: 172 10*3/uL (ref 150–400)
RBC: 2.94 MIL/uL — ABNORMAL LOW (ref 3.87–5.11)
RDW: 14.7 % (ref 11.5–15.5)
WBC Count: 2.8 10*3/uL — ABNORMAL LOW (ref 4.0–10.5)
nRBC: 0 % (ref 0.0–0.2)

## 2023-07-12 LAB — BASIC METABOLIC PANEL - CANCER CENTER ONLY
Anion gap: 11 (ref 5–15)
BUN: 26 mg/dL — ABNORMAL HIGH (ref 8–23)
CO2: 25 mmol/L (ref 22–32)
Calcium: 9.2 mg/dL (ref 8.9–10.3)
Chloride: 101 mmol/L (ref 98–111)
Creatinine: 0.85 mg/dL (ref 0.44–1.00)
GFR, Estimated: >60 mL/min (ref >60)
Glucose, Bld: 118 mg/dL — ABNORMAL HIGH (ref 70–99)
Potassium: 3.7 mmol/L (ref 3.5–5.1)
Sodium: 137 mmol/L (ref 135–145)

## 2023-07-12 LAB — URINALYSIS, ROUTINE W REFLEX MICROSCOPIC
Bacteria, UA: NONE SEEN
Bilirubin Urine: NEGATIVE
Glucose, UA: 50 mg/dL — AB
Ketones, ur: NEGATIVE mg/dL
Leukocytes,Ua: NEGATIVE
Nitrite: NEGATIVE
Protein, ur: >=300 mg/dL — AB
RBC / HPF: >50 RBC/hpf (ref 0–5)
Specific Gravity, Urine: 1.018 (ref 1.005–1.030)
WBC, UA: >50 WBC/hpf (ref 0–5)
pH: 5 (ref 5.0–8.0)

## 2023-07-12 MED ORDER — SULFAMETHOXAZOLE-TRIMETHOPRIM 800-160 MG PO TABS
1.0000 | ORAL_TABLET | Freq: Two times a day (BID) | ORAL | 0 refills | Status: DC
  Filled 2023-07-12 (×3): qty 28, 14d supply, fill #0

## 2023-07-12 MED ORDER — TRAMADOL HCL 50 MG PO TABS
50.0000 mg | ORAL_TABLET | Freq: Four times a day (QID) | ORAL | 0 refills | Status: DC | PRN
  Filled 2023-07-12 (×2): qty 30, 8d supply, fill #0

## 2023-07-12 NOTE — Progress Notes (Signed)
  Radiation Oncology         [phone]) [phone] ________________________________  Name: ADAYAH AROCHO MRN: [id number]  Date: 07/12/2023  DOB: 1955-08-16   Radiation Therapy under treatment note  Today the patient was scheduled to receive her second high-dose-rate treatment directed at the vaginal cuff.  Over the weekend she began having right flank pain as well as hematuria.  She denied any chills or fever.  She also was unsure whether she was having some vaginal bleeding or whether it was all urinary in nature.  Limited pelvic exam was performed.  There was no blood noted in the vaginal vault.  No unusual findings of the vaginal cuff.  On bimanual examination there was no fluctuance or tenderness along the vaginal vault.  She submitted a urine specimen which showed obvious hematuria.  This will be sent to the lab for urinalysis culture and sensitivity.  Given the patient's symptoms and severity I will not wait on culture to begin antibiotics.  Pharmacy was consulted and recommended Septra DS for 14-day course in light of CVA tenderness.  CBC with differential and chemistries were also obtained today.  In light of the patient's symptoms treatment for her HDR will be postponed.  She will return next week to resume her HDR treatments.  Antibiotics will be changed if necessary pending results of her urine culture.  In light of her pain her tramadol has been refilled.  She does not have any significant dysuria but does notice suprapubic discomfort.    ________________________________   Billie Lade, PhD, MD

## 2023-07-13 LAB — URINE CULTURE

## 2023-07-14 ENCOUNTER — Encounter (HOSPITAL_COMMUNITY): Payer: Self-pay

## 2023-07-14 ENCOUNTER — Ambulatory Visit
Admission: RE | Admit: 2023-07-14 | Discharge: 2023-07-14 | Disposition: A | Discharge status: Home / Self Care | Source: Ambulatory Visit | Attending: Radiation Oncology | Admitting: Radiation Oncology

## 2023-07-14 ENCOUNTER — Emergency Department (HOSPITAL_COMMUNITY): Admission: EM | Admit: 2023-07-14 | Discharge: 2023-07-14 | Disposition: A | Discharge status: Home / Self Care

## 2023-07-14 ENCOUNTER — Emergency Department (HOSPITAL_COMMUNITY)

## 2023-07-14 ENCOUNTER — Other Ambulatory Visit: Payer: Self-pay

## 2023-07-14 DIAGNOSIS — R109 Unspecified abdominal pain: Secondary | ICD-10-CM | POA: Diagnosis not present

## 2023-07-14 DIAGNOSIS — N134 Hydroureter: Secondary | ICD-10-CM | POA: Diagnosis not present

## 2023-07-14 DIAGNOSIS — I10 Essential (primary) hypertension: Secondary | ICD-10-CM | POA: Insufficient documentation

## 2023-07-14 DIAGNOSIS — R319 Hematuria, unspecified: Secondary | ICD-10-CM

## 2023-07-14 DIAGNOSIS — Z7901 Long term (current) use of anticoagulants: Secondary | ICD-10-CM | POA: Diagnosis not present

## 2023-07-14 DIAGNOSIS — Z8542 Personal history of malignant neoplasm of other parts of uterus: Secondary | ICD-10-CM | POA: Diagnosis not present

## 2023-07-14 DIAGNOSIS — C530 Malignant neoplasm of endocervix: Secondary | ICD-10-CM | POA: Insufficient documentation

## 2023-07-14 DIAGNOSIS — N1330 Unspecified hydronephrosis: Secondary | ICD-10-CM

## 2023-07-14 DIAGNOSIS — N131 Hydronephrosis with ureteral stricture, not elsewhere classified: Secondary | ICD-10-CM | POA: Diagnosis not present

## 2023-07-14 DIAGNOSIS — N281 Cyst of kidney, acquired: Secondary | ICD-10-CM | POA: Diagnosis not present

## 2023-07-14 DIAGNOSIS — N12 Tubulo-interstitial nephritis, not specified as acute or chronic: Secondary | ICD-10-CM | POA: Insufficient documentation

## 2023-07-14 LAB — CBC WITH DIFFERENTIAL/PLATELET
Abs Immature Granulocytes: 0.01 10*3/uL (ref 0.00–0.07)
Basophils Absolute: 0 10*3/uL (ref 0.0–0.1)
Basophils Relative: 0 %
Eosinophils Absolute: 0 10*3/uL (ref 0.0–0.5)
Eosinophils Relative: 1 %
HCT: 29.2 % — ABNORMAL LOW (ref 36.0–46.0)
Hemoglobin: 10.4 g/dL — ABNORMAL LOW (ref 12.0–15.0)
Immature Granulocytes: 0 %
Lymphocytes Relative: 10 %
Lymphs Abs: 0.3 10*3/uL — ABNORMAL LOW (ref 0.7–4.0)
MCH: 34.7 pg — ABNORMAL HIGH (ref 26.0–34.0)
MCHC: 35.6 g/dL (ref 30.0–36.0)
MCV: 97.3 fL (ref 80.0–100.0)
Monocytes Absolute: 0.5 10*3/uL (ref 0.1–1.0)
Monocytes Relative: 17 %
Neutro Abs: 1.9 10*3/uL (ref 1.7–7.7)
Neutrophils Relative %: 72 %
Platelets: 190 10*3/uL (ref 150–400)
RBC: 3 MIL/uL — ABNORMAL LOW (ref 3.87–5.11)
RDW: 15.1 % (ref 11.5–15.5)
WBC: 2.7 10*3/uL — ABNORMAL LOW (ref 4.0–10.5)
nRBC: 0 % (ref 0.0–0.2)

## 2023-07-14 LAB — URINALYSIS, COMPLETE (UACMP) WITH MICROSCOPIC
Bacteria, UA: NONE SEEN
Bilirubin Urine: NEGATIVE
Glucose, UA: NEGATIVE mg/dL
Ketones, ur: NEGATIVE mg/dL
Nitrite: NEGATIVE
Protein, ur: >=300 mg/dL — AB
RBC / HPF: >50 RBC/hpf (ref 0–5)
Specific Gravity, Urine: 1.015 (ref 1.005–1.030)
WBC, UA: >50 WBC/hpf (ref 0–5)
pH: 6 (ref 5.0–8.0)

## 2023-07-14 LAB — COMPREHENSIVE METABOLIC PANEL WITH GFR
ALT: 21 U/L (ref 0–44)
AST: 21 U/L (ref 15–41)
Albumin: 4.3 g/dL (ref 3.5–5.0)
Alkaline Phosphatase: 54 U/L (ref 38–126)
Anion gap: 14 (ref 5–15)
BUN: 27 mg/dL — ABNORMAL HIGH (ref 8–23)
CO2: 22 mmol/L (ref 22–32)
Calcium: 9 mg/dL (ref 8.9–10.3)
Chloride: 98 mmol/L (ref 98–111)
Creatinine, Ser: 1.11 mg/dL — ABNORMAL HIGH (ref 0.44–1.00)
GFR, Estimated: 55 mL/min — ABNORMAL LOW (ref >60)
Glucose, Bld: 128 mg/dL — ABNORMAL HIGH (ref 70–99)
Potassium: 3.5 mmol/L (ref 3.5–5.1)
Sodium: 134 mmol/L — ABNORMAL LOW (ref 135–145)
Total Bilirubin: 0.8 mg/dL (ref 0.0–1.2)
Total Protein: 7.4 g/dL (ref 6.5–8.1)

## 2023-07-14 LAB — LIPASE, BLOOD: Lipase: 23 U/L (ref 11–51)

## 2023-07-14 MED ORDER — SODIUM CHLORIDE 0.9 % IV SOLN
2.0000 g | Freq: Once | INTRAVENOUS | Status: AC
  Administered 2023-07-14: 2 g via INTRAVENOUS
  Filled 2023-07-14: qty 20

## 2023-07-14 MED ORDER — MORPHINE SULFATE (PF) 4 MG/ML IV SOLN
4.0000 mg | Freq: Once | INTRAVENOUS | Status: DC
  Filled 2023-07-14: qty 1

## 2023-07-14 MED ORDER — ONDANSETRON 4 MG PO TBDP
4.0000 mg | ORAL_TABLET | Freq: Three times a day (TID) | ORAL | 0 refills | Status: DC | PRN
  Filled 2023-07-14: qty 20, 7d supply, fill #0

## 2023-07-14 MED ORDER — HEPARIN SOD (PORK) LOCK FLUSH 100 UNIT/ML IV SOLN
500.0000 [IU] | Freq: Once | INTRAVENOUS | Status: AC
  Administered 2023-07-14: 500 [IU]
  Filled 2023-07-14: qty 5

## 2023-07-14 MED ORDER — OXYCODONE-ACETAMINOPHEN 5-325 MG PO TABS
1.0000 | ORAL_TABLET | Freq: Four times a day (QID) | ORAL | 0 refills | Status: DC | PRN
  Filled 2023-07-14: qty 15, 4d supply, fill #0

## 2023-07-14 MED ORDER — ONDANSETRON HCL 4 MG/2ML IJ SOLN
4.0000 mg | Freq: Once | INTRAMUSCULAR | Status: AC
  Administered 2023-07-14: 4 mg via INTRAVENOUS
  Filled 2023-07-14: qty 2

## 2023-07-14 MED ORDER — CEFDINIR 300 MG PO CAPS
300.0000 mg | ORAL_CAPSULE | Freq: Two times a day (BID) | ORAL | 0 refills | Status: DC
  Filled 2023-07-14: qty 14, 7d supply, fill #0

## 2023-07-14 MED ORDER — LIDOCAINE-PRILOCAINE 2.5-2.5 % EX CREA
TOPICAL_CREAM | Freq: Once | CUTANEOUS | Status: AC
  Filled 2023-07-14: qty 5

## 2023-07-14 MED ORDER — HYDROMORPHONE HCL 1 MG/ML IJ SOLN
0.5000 mg | Freq: Once | INTRAMUSCULAR | Status: DC
  Filled 2023-07-14: qty 0.5

## 2023-07-14 NOTE — Discharge Instructions (Addendum)
 Your CT scan showed some hydronephrosis which is when urine backs up into the kidney and causes swelling.  This may be due to infection.  I did discuss this with our urologist and they recommended antibiotics.  Please take these.  Take the Zofran as needed for nausea and the Percocet as needed for pain.  Please follow-up with the urologist at the number provided.  Return to the ER for worsening symptoms.

## 2023-07-14 NOTE — ED Provider Notes (Signed)
 [place] EMERGENCY DEPARTMENT AT [facility] Provider Note   CSN: [id number] Arrival date & time: 07/14/23  1523     History  Chief Complaint  Patient presents with   Flank Pain    Joan [name] is a 68 y.o. female.  68 year old female with past medical history of endometrial cancer and hypertension presenting to the emergency department today with right-sided flank pain.  The patient states that she been having right-sided flank pain as well as will last some hematuria he has been going on now for the past 4 days.  She states that she has had some nausea but denies any associated vomiting.  Reports normal bowel movements with this.  She denies any anterior abdominal pain.  States is most on the right flank region.  Denies any rashes.  She is currently undergoing treatment for endometrial cancer and as far she knows she does seem to be responding well.   Flank Pain       Home Medications Prior to Admission medications   Medication Sig Start Date End Date Taking? Authorizing Provider  cefdinir (OMNICEF) 300 MG capsule Take 1 capsule (300 mg total) by mouth 2 (two) times daily. 07/14/23  Yes Durwin Glaze, MD  ondansetron (ZOFRAN-ODT) 4 MG disintegrating tablet Take 1 tablet (4 mg total) by mouth every 8 (eight) hours as needed for nausea or vomiting. 07/14/23  Yes Durwin Glaze, MD  oxyCODONE-acetaminophen (PERCOCET/ROXICET) 5-325 MG tablet Take 1 tablet by mouth every 6 (six) hours as needed for severe pain (pain score 7-10). 07/14/23  Yes Durwin Glaze, MD  apixaban (ELIQUIS) 2.5 MG TABS tablet Take 1 tablet (2.5 mg total) by mouth 2 (two) times daily. For AFTER surgery only Patient not taking: Reported on 07/05/2023 04/22/23   Warner Mccreedy D, NP  Cholecalciferol (VITAMIN D) 50 MCG (2000 UT) tablet Take 2,000-4,000 Units by mouth See admin instructions. Take 2000 units by mouth every other day, alternating with 4000 units on alternate days    [provider]   Dimethyl Fumarate 240 MG CPDR Take 1 capsule (240 mg total) by mouth 2 (two) times daily. 05/31/23 05/30/24  Sater, Pearletha Furl, MD  prochlorperazine (COMPAZINE) 10 MG tablet Take 1 tablet (10 mg total) by mouth every 6 (six) hours as needed (nausea or vomiting). 05/17/23     rosuvastatin (CRESTOR) 10 MG tablet Take 1 tablet (10 mg total) by mouth daily. 05/07/23     sulfamethoxazole-trimethoprim (BACTRIM DS) 800-160 MG tablet Take 1 tablet by mouth 2 (two) times daily. 07/12/23   Antony Blackbird, MD  valsartan-hydrochlorothiazide (DIOVAN-HCT) 80-12.5 MG tablet Take 1 tablet by mouth daily. 03/18/23         Allergies    Patient has no known allergies.    Review of Systems   Review of Systems  Genitourinary:  Positive for flank pain and hematuria.  All other systems reviewed and are negative.   Physical Exam Updated Vital Signs BP 130/75   Pulse 98   Temp 97.6 F (36.4 C)   Resp 18   Ht 5\' 6"  (1.676 m)   Wt 93 kg   SpO2 93%   BMI 33.09 kg/m  Physical Exam Vitals and nursing note reviewed.   Gen: NAD Eyes: PERRL, EOMI HEENT: no oropharyngeal swelling Neck: trachea midline Resp: clear to auscultation bilaterally Card: RRR, no murmurs, rubs, or gallops Abd: Right-sided CVA tenderness noted Extremities: no calf tenderness, no edema Vascular: 2+ radial pulses bilaterally, 2+ DP pulses bilaterally  Skin: no rashes Psyc: acting appropriately   ED Results / Procedures / Treatments   Labs (all labs ordered are listed, but only abnormal results are displayed) Labs Reviewed  CBC WITH DIFFERENTIAL/PLATELET - Abnormal; Notable for the following components:      Result Value   WBC 2.7 (*)    RBC 3.00 (*)    Hemoglobin 10.4 (*)    HCT 29.2 (*)    MCH 34.7 (*)    Lymphs Abs 0.3 (*)    All other components within normal limits  COMPREHENSIVE METABOLIC PANEL WITH GFR - Abnormal; Notable for the following components:   Sodium 134 (*)    Glucose, Bld 128 (*)    BUN 27 (*)    Creatinine,  Ser 1.11 (*)    GFR, Estimated 55 (*)    All other components within normal limits  URINE CULTURE  LIPASE, BLOOD    EKG None  Radiology CT Renal Stone Study Result Date: 07/14/2023 CLINICAL DATA:  Right flank pain radiating to groin, hematuria EXAM: CT ABDOMEN AND PELVIS WITHOUT CONTRAST TECHNIQUE: Multidetector CT imaging of the abdomen and pelvis was performed following the standard protocol without IV contrast. RADIATION DOSE REDUCTION: This exam was performed according to the departmental dose-optimization program which includes automated exposure control, adjustment of the mA and/or kV according to patient size and/or use of iterative reconstruction technique. COMPARISON:  03/05/2023 FINDINGS: Lower chest: No acute pleural or parenchymal lung disease. Hepatobiliary: Unremarkable unenhanced appearance of the liver and gallbladder. Pancreas: Unremarkable unenhanced appearance. Spleen: Unremarkable unenhanced appearance. Adrenals/Urinary Tract: There is moderate right-sided hydronephrosis and hydroureter, with fat stranding surrounding the mid right ureter. I do not see any obstructing calculus or mass on this unenhanced exam. Stable exophytic simple cyst upper pole right kidney does not require specific follow-up. The left kidney is unremarkable. Bladder is decompressed, limiting its evaluation. No evidence of bladder calculi. The adrenals are unremarkable. Stomach/Bowel: No bowel obstruction or ileus. Normal appendix right lower quadrant. No bowel wall thickening or inflammatory change. Vascular/Lymphatic: Aortic atherosclerosis. No enlarged abdominal or pelvic lymph nodes. Reproductive: Status post hysterectomy. No adnexal masses. Other: No free fluid or free intraperitoneal gas. No abdominal wall hernia. Musculoskeletal: No acute or destructive bony abnormalities. Reconstructed images demonstrate no additional findings. IMPRESSION: 1. Moderate right-sided hydronephrosis and hydroureter, with fat  stranding surrounding the mid right ureter. I do not see a clear cause for obstruction on this exam, with no radiopaque calculus identified. Please correlate with urinalysis. If further evaluation is desired, retrograde ureteroscopy could be considered. 2.  Aortic Atherosclerosis (ICD10-I70.0). Electronically Signed   By: Sharlet Salina M.D.   On: 07/14/2023 19:07    Procedures Procedures    Medications Ordered in ED Medications  HYDROmorphone (DILAUDID) injection 0.5 mg (has no administration in time range)  cefTRIAXone (ROCEPHIN) 2 g in sodium chloride 0.9 % 100 mL IVPB (has no administration in time range)  lidocaine-prilocaine (EMLA) cream ( Topical Given 07/14/23 2027)  ondansetron (ZOFRAN) injection 4 mg (4 mg Intravenous Given 07/14/23 2036)    ED Course/ Medical Decision Making/ A&P                                 Medical Decision Making 68 year old female with past medical history of endocervical cancer who is undergoing treatment for this presenting to the emergency department today with new onset of hematuria and flank pain over the past few days.  Will further evaluate the patient here with basic labs as well as a urinalysis to eval for urinary tract infection.  Will obtain a CT scan to evaluate for ureterolithiasis or structural lesion.  Will give the patient morphine and Zofran for her pain and reevaluate for ultimate disposition.  CT will also evaluate for any other intra-abdominal pathology related to her cancer.  The patient was found to have some hydronephrosis on her CT scan.  There was no obvious stone noted.  She did have some pyuria.  I did call and discussed this with Dr. Pete Glatter.  He recommends outpatient follow-up as the patient is feeling better and is requesting discharge.  Recommends oral antibiotics in addition to the IV Rocephin that she has already received.  The patient is in agreement with this plan and is discharged with urology follow-up.  Amount and/or  Complexity of Data Reviewed Labs: ordered. Radiology: ordered.  Risk Prescription drug management.           Final Clinical Impression(s) / ED Diagnoses Final diagnoses:  Pyelonephritis  Hydronephrosis, unspecified hydronephrosis type    Rx / DC Orders ED Discharge Orders          Ordered    cefdinir (OMNICEF) 300 MG capsule  2 times daily        07/14/23 2223    oxyCODONE-acetaminophen (PERCOCET/ROXICET) 5-325 MG tablet  Every 6 hours PRN        07/14/23 2223    ondansetron (ZOFRAN-ODT) 4 MG disintegrating tablet  Every 8 hours PRN        07/14/23 2223              Durwin Glaze, MD 07/14/23 2224

## 2023-07-14 NOTE — ED Notes (Signed)
 Port accessed. Port not previously documented in chart.

## 2023-07-14 NOTE — ED Triage Notes (Signed)
 Pt arrived via POV c/o right flank pain that radiates to her groin. Pt also reports hematuria since Sunday.

## 2023-07-14 NOTE — ED Notes (Signed)
 Pt notified in triage of situation in the back and that we're working on getting rooms open. We're simply waiting for rooms to come open.

## 2023-07-15 ENCOUNTER — Other Ambulatory Visit (HOSPITAL_COMMUNITY): Payer: Self-pay

## 2023-07-15 LAB — URINE CULTURE: Culture: NO GROWTH

## 2023-07-15 MED ORDER — VALSARTAN-HYDROCHLOROTHIAZIDE 80-12.5 MG PO TABS
1.0000 | ORAL_TABLET | Freq: Every day | ORAL | 0 refills | Status: DC
Start: 2023-07-15 — End: 2023-11-01
  Filled 2023-07-15: qty 90, 90d supply, fill #0

## 2023-07-16 ENCOUNTER — Other Ambulatory Visit: Payer: Self-pay

## 2023-07-16 ENCOUNTER — Ambulatory Visit: Admitting: Urology

## 2023-07-16 ENCOUNTER — Other Ambulatory Visit (HOSPITAL_COMMUNITY): Payer: Self-pay

## 2023-07-16 VITALS — BP 116/67 | HR 102

## 2023-07-16 DIAGNOSIS — N281 Cyst of kidney, acquired: Secondary | ICD-10-CM | POA: Diagnosis not present

## 2023-07-16 DIAGNOSIS — R310 Gross hematuria: Secondary | ICD-10-CM

## 2023-07-16 LAB — MICROSCOPIC EXAMINATION
Bacteria, UA: NONE SEEN
RBC, Urine: >30 /HPF — AB (ref 0–2)

## 2023-07-16 LAB — URINALYSIS, ROUTINE W REFLEX MICROSCOPIC
Bilirubin, UA: NEGATIVE
Glucose, UA: NEGATIVE
Ketones, UA: NEGATIVE
Nitrite, UA: NEGATIVE
Specific Gravity, UA: 1.025 (ref 1.005–1.030)
Urobilinogen, Ur: 1 mg/dL (ref 0.2–1.0)
pH, UA: 7 (ref 5.0–7.5)

## 2023-07-16 MED ORDER — TAMSULOSIN HCL 0.4 MG PO CAPS
0.4000 mg | ORAL_CAPSULE | Freq: Every day | ORAL | 0 refills | Status: DC
Start: 2023-07-16 — End: 2023-07-16
  Filled 2023-07-16: qty 30, 30d supply, fill #0

## 2023-07-16 MED ORDER — TAMSULOSIN HCL 0.4 MG PO CAPS
0.4000 mg | ORAL_CAPSULE | Freq: Every day | ORAL | 0 refills | Status: DC
Start: 2023-07-16 — End: 2023-11-23
  Filled 2023-07-16: qty 30, 30d supply, fill #0

## 2023-07-16 NOTE — Progress Notes (Signed)
 07/16/2023 11:57 AM   Joan [name] [birth year] [id number]  Referring provider: Omie Bickers, MD [address] [place],  [place] [address]  Gross hematuria   HPI: Joan [name] is a [phone] here for evaluation of right flank pain and hematuria. Starting Sunday she developed right flank pain and gross hematuria. She had hysterectomy 1/25 for presumed endometrial carcinoma but final pathology was cervical cancer. She was started on external radiation therapy for 6 weeks and then had had internal radiation therapy due to positive surgical margin.She had the internal radiation therapy 1 week prior to the hematuria. CT stone study showed mild to moderate right hydronephrosis to the mid ureter with no stone. She was given dose of rocephin  in the ER. She was previously on bactrim  which failed to improve the pain and the LUTS. She has urinary frequency, urgency and nocturia. She has a 30pk year smoking hx.    PMH: Past Medical History:  Diagnosis Date   Cancer [facility])    Endometrial   Colitis, ischemic (HCC) 2003   Headaches, cluster    Hypercholesterolemia    Hypertension    Insomnia    Multiple sclerosis (HCC) 05/29/2018   Neuromuscular disorder (HCC)    Multiple Sclerosis   PE (pulmonary embolism)    ?birth control, in 43s   PONV (postoperative nausea and vomiting)    S/P colonoscopy 7829,5621   2004: tubular adeboma, 2007: hyperplastic polyps. Due 2012   Seasonal allergies    Tobacco abuse    Tubular adenoma 2004   colonoscopy 2004    Surgical History: Past Surgical History:  Procedure Laterality Date   BREAST BIOPSY Left    benign   COLONOSCOPY  09/17/05   20 cm polyp removed/bx/middescending colon polyp removed with snare   COLONOSCOPY  2003   tubular adenoma removed from cecum    COLONOSCOPY  03/31/2011   RMR, diverticulosis, multiple colon polyps removed (tubular adenomas). next TCS 03/2016   COLONOSCOPY N/A 10/22/2015   Procedure: COLONOSCOPY;  Surgeon: Suzette Espy, MD;  [facility]: [facility];  Service: Endoscopy;  Laterality: N/A;  730   COLONOSCOPY N/A 02/05/2021   Procedure: COLONOSCOPY;  Surgeon: Suzette Espy, MD;  Location: [facility];  Service: Endoscopy;  Laterality: N/A;  ASA II / 9:30   ESOPHAGOGASTRODUODENOSCOPY N/A 02/15/2015   RMR, mild reflux esophagitis. empirical dilation of esophagus.    LYMPH NODE DISSECTION N/A 04/21/2023   Procedure: LYMPH NODE DISSECTION;  Surgeon: Suzi Essex, MD;  Location: [facility];  Service: Gynecology;  Laterality: N/A;   MALONEY DILATION N/A 02/15/2015   Procedure: Londa Rival DILATION;  Surgeon: Suzette Espy, MD;  Location: [facility];  Service: Endoscopy;  Laterality: N/A;   POLYPECTOMY  02/05/2021   Procedure: POLYPECTOMY;  Surgeon: Suzette Espy, MD;  Location: [facility];  Service: Endoscopy;;   ROBOTIC ASSISTED TOTAL HYSTERECTOMY WITH BILATERAL SALPINGO OOPHERECTOMY Bilateral 04/21/2023   Procedure: XI ROBOTIC ASSISTED TOTAL HYSTERECTOMY WITH BILATERAL SALPINGO OOPHORECTOMY; OMENTECTOMY;  Surgeon: Suzi Essex, MD;  Location: [facility];  Service: Gynecology;  Laterality: Bilateral;   SENTINEL NODE BIOPSY N/A 04/21/2023   Procedure: SENTINEL NODE BIOPSY;  Surgeon: Suzi Essex, MD;  Location: [facility];  Service: Gynecology;  Laterality: N/A;   TUBAL LIGATION      Home Medications:  Allergies as of 07/16/2023   No Known Allergies      Medication List        Accurate as of  July 16, 2023 11:57 AM. If you have any questions, ask your nurse or doctor.          STOP taking these medications    cefdinir  300 MG capsule Commonly known as: OMNICEF  Stopped by: Johnie Nailer   Eliquis  2.5 MG Tabs tablet Generic drug: apixaban  Stopped by: Johnie Nailer   ondansetron  4 MG disintegrating tablet Commonly known as: ZOFRAN -ODT Stopped by: Johnie Nailer       TAKE these medications    Dimethyl Fumarate  240 MG Cpdr Take 1 capsule (240 mg total) by mouth 2 (two)  times daily.   oxyCODONE -acetaminophen  5-325 MG tablet Commonly known as: PERCOCET/ROXICET Take 1 tablet by mouth every 6 (six) hours as needed for severe pain (pain score 7-10).   prochlorperazine  10 MG tablet Commonly known as: COMPAZINE  Take 1 tablet (10 mg total) by mouth every 6 (six) hours as needed (nausea or vomiting).   rosuvastatin  10 MG tablet Commonly known as: CRESTOR  Take 1 tablet (10 mg total) by mouth daily.   sulfamethoxazole -trimethoprim  800-160 MG tablet Commonly known as: BACTRIM  DS Take 1 tablet by mouth 2 (two) times daily.   valsartan -hydrochlorothiazide  80-12.5 MG tablet Commonly known as: DIOVAN -HCT Take 1 tablet by mouth daily.   Vitamin D  50 MCG (2000 UT) tablet Take 2,000-4,000 Units by mouth See admin instructions. Take 2000 units by mouth every other day, alternating with 4000 units on alternate days        Allergies: No Known Allergies  Family History: Family History  Problem Relation Age of Onset   CVA Father    Hypertension Father    Breast cancer Maternal Aunt    Breast cancer Paternal Aunt    Cervical cancer Other    Colon cancer Neg Hx    Ovarian cancer Neg Hx    Endometrial cancer Neg Hx    Pancreatic cancer Neg Hx    Prostate cancer Neg Hx     Social History:  reports that she has been smoking cigarettes. She has a 15 pack-year smoking history. She has never used smokeless tobacco. She reports that she does not drink alcohol and does not use drugs.  ROS: All other review of systems were reviewed and are negative except what is noted above in HPI  Physical Exam: BP 116/67   Pulse (!) 102   Constitutional:  Alert and oriented, No acute distress. HEENT: [place] AT, moist mucus membranes.  Trachea midline, no masses. Cardiovascular: No clubbing, cyanosis, or edema. Respiratory: Normal respiratory effort, no increased work of breathing. GI: Abdomen is soft, nontender, nondistended, no abdominal masses GU: No CVA tenderness.   Lymph: No cervical or inguinal lymphadenopathy. Skin: No rashes, bruises or suspicious lesions. Neurologic: Grossly intact, no focal deficits, moving all 4 extremities. Psychiatric: Normal mood and affect.  Laboratory Data: Lab Results  Component Value Date   WBC 2.7 (L) 07/14/2023   HGB 10.4 (L) 07/14/2023   HCT 29.2 (L) 07/14/2023   MCV 97.3 07/14/2023   PLT 190 07/14/2023    Lab Results  Component Value Date   CREATININE 1.11 (H) 07/14/2023    No results found for: "PSA"  No results found for: "TESTOSTERONE"  No results found for: "HGBA1C"  Urinalysis    Component Value Date/Time   COLORURINE YELLOW 07/14/2023 0843   APPEARANCEUR CLOUDY (A) 07/14/2023 0843   LABSPEC 1.015 07/14/2023 0843   PHURINE 6.0 07/14/2023 0843   GLUCOSEU NEGATIVE 07/14/2023 0843   HGBUR LARGE (A) 07/14/2023 0843   BILIRUBINUR NEGATIVE 07/14/2023  0843   KETONESUR NEGATIVE 07/14/2023 0843   PROTEINUR >=300 (A) 07/14/2023 0843   UROBILINOGEN 1.0 09/19/2008 0806   NITRITE NEGATIVE 07/14/2023 0843   LEUKOCYTESUR SMALL (A) 07/14/2023 0843    Lab Results  Component Value Date   BACTERIA NONE SEEN 07/14/2023    Pertinent Imaging:  No results found for this or any previous visit.  No results found for this or any previous visit.  No results found for this or any previous visit.  No results found for this or any previous visit.  No results found for this or any previous visit.  No results found for this or any previous visit.  No results found for this or any previous visit.  Results for orders placed during the hospital encounter of 07/14/23  CT Renal Stone Study  Narrative CLINICAL DATA:  Right flank pain radiating to groin, hematuria  EXAM: CT ABDOMEN AND PELVIS WITHOUT CONTRAST  TECHNIQUE: Multidetector CT imaging of the abdomen and pelvis was performed following the standard protocol without IV contrast.  RADIATION DOSE REDUCTION: This exam was performed according  to the departmental dose-optimization program which includes automated exposure control, adjustment of the mA and/or kV according to patient size and/or use of iterative reconstruction technique.  COMPARISON:  03/05/2023  FINDINGS: Lower chest: No acute pleural or parenchymal lung disease.  Hepatobiliary: Unremarkable unenhanced appearance of the liver and gallbladder.  Pancreas: Unremarkable unenhanced appearance.  Spleen: Unremarkable unenhanced appearance.  Adrenals/Urinary Tract: There is moderate right-sided hydronephrosis and hydroureter, with fat stranding surrounding the mid right ureter. I do not see any obstructing calculus or mass on this unenhanced exam. Stable exophytic simple cyst upper pole right kidney does not require specific follow-up.  The left kidney is unremarkable. Bladder is decompressed, limiting its evaluation. No evidence of bladder calculi. The adrenals are unremarkable.  Stomach/Bowel: No bowel obstruction or ileus. Normal appendix right lower quadrant. No bowel wall thickening or inflammatory change.  Vascular/Lymphatic: Aortic atherosclerosis. No enlarged abdominal or pelvic lymph nodes.  Reproductive: Status post hysterectomy. No adnexal masses.  Other: No free fluid or free intraperitoneal gas. No abdominal wall hernia.  Musculoskeletal: No acute or destructive bony abnormalities. Reconstructed images demonstrate no additional findings.  IMPRESSION: 1. Moderate right-sided hydronephrosis and hydroureter, with fat stranding surrounding the mid right ureter. I do not see a clear cause for obstruction on this exam, with no radiopaque calculus identified. Please correlate with urinalysis. If further evaluation is desired, retrograde ureteroscopy could be considered. 2.  Aortic Atherosclerosis (ICD10-I70.0).   Electronically Signed By: Bobbye Burrow M.D. On: 07/14/2023 19:07   Assessment & Plan:    1. Kidney cysts (Primary) Ct  hematuria protocol - Urinalysis, Routine w reflex microscopic  2. Gross hematuria Ct Hematuria protocol Urine for cystology   No follow-ups on file.  Johnie Nailer, MD  [facility] [place]

## 2023-07-19 ENCOUNTER — Telehealth: Payer: Self-pay

## 2023-07-19 ENCOUNTER — Other Ambulatory Visit (HOSPITAL_COMMUNITY): Payer: Self-pay

## 2023-07-19 LAB — CYTOLOGY, URINE

## 2023-07-19 NOTE — Telephone Encounter (Signed)
 Patient called in to report going to see urologist due to severe right flank pain. Per patient she was told  by Dr. Ronne Binning to hold HDR treatments until kidneys are functioning normally. Patient reports not being able to void at all this morning. Patient also wants to know prognosis if HDR treatments are held. Please advise.

## 2023-07-21 ENCOUNTER — Ambulatory Visit: Admitting: Urology

## 2023-07-21 ENCOUNTER — Telehealth: Payer: Self-pay | Admitting: *Deleted

## 2023-07-21 VITALS — BP 90/56 | HR 111

## 2023-07-21 DIAGNOSIS — R310 Gross hematuria: Secondary | ICD-10-CM

## 2023-07-21 DIAGNOSIS — R3914 Feeling of incomplete bladder emptying: Secondary | ICD-10-CM

## 2023-07-21 DIAGNOSIS — N281 Cyst of kidney, acquired: Secondary | ICD-10-CM | POA: Diagnosis not present

## 2023-07-21 LAB — URINALYSIS, ROUTINE W REFLEX MICROSCOPIC
Bilirubin, UA: NEGATIVE
Glucose, UA: NEGATIVE
Ketones, UA: NEGATIVE
Nitrite, UA: NEGATIVE
Specific Gravity, UA: 1.02 (ref 1.005–1.030)
Urobilinogen, Ur: 0.2 mg/dL (ref 0.2–1.0)
pH, UA: 6.5 (ref 5.0–7.5)

## 2023-07-21 LAB — MICROSCOPIC EXAMINATION: Bacteria, UA: NONE SEEN

## 2023-07-21 LAB — BLADDER SCAN AMB NON-IMAGING: Scan Result: 0

## 2023-07-21 MED ORDER — GEMTESA 75 MG PO TABS: 1.0000 | ORAL_TABLET | Freq: Every day | ORAL | Status: DC

## 2023-07-21 MED ORDER — SULFAMETHOXAZOLE-TRIMETHOPRIM 800-160 MG PO TABS: 1.0000 | ORAL_TABLET | Freq: Two times a day (BID) | ORAL | 0 refills | Status: DC

## 2023-07-21 NOTE — Progress Notes (Signed)
  Radiation Oncology         [phone] ________________________________  Name: Joan [name] MRN: [id number]  Date: 07/22/2023  DOB: 1955-07-30  CC: Benita Stabile, MD  Carver Fila, MD  HDR BRACHYTHERAPY NOTE  DIAGNOSIS: Stage IB poorly differentiated carcinoma of the cervix    Simple treatment device note: Patient had construction of her custom vaginal cylinder. She will be treated with a 3.0 cm diameter segmented cylinder. This conforms to her anatomy without undue discomfort.  Vaginal brachytherapy procedure node: The patient was brought to the HDR suite. Identity was confirmed. All relevant records and images related to the planned course of therapy were reviewed. The patient freely provided informed written consent to proceed with treatment after reviewing the details related to the planned course of therapy. The consent form was witnessed and verified by the simulation staff. Then, the patient was set-up in a stable reproducible supine position for radiation therapy. Pelvic exam revealed the vaginal cuff to be intact . The patient's custom vaginal cylinder was placed in the proximal vagina. This was affixed to the CT/MR stabilization plate to prevent slippage. Patient tolerated the placement well.  Verification simulation note:  A fiducial marker was placed within the vaginal cylinder. An AP and lateral film was then obtained through the pelvis area. This documented accurate position of the vaginal cylinder for treatment.  HDR BRACHYTHERAPY TREATMENT  The remote afterloading device was affixed to the vaginal cylinder by catheter. Patient then proceeded to undergo her second high-dose-rate treatment directed at the proximal vagina. The patient was prescribed a dose of 6.0 gray to be delivered to the mucosal surface. Treatment length was 2.5 cm. Patient was treated with 1 channel using 6 dwell positions. Treatment time was 160.1 seconds. Iridium 192 was the high-dose-rate source  for treatment. The patient tolerated the treatment well. After completion of her therapy, a radiation survey was performed documenting return of the iridium source into the [employer] safe.   PLAN: The patient will return next week for her third high-dose-rate treatment  ________________________________  -----------------------------------  Billie Lade, PhD, MD  This document serves as a record of services personally performed by Antony Blackbird, MD. It was created on his behalf by Herbie Saxon, a trained medical scribe. The creation of this record is based on the scribe's personal observations and the provider's statements to them. This document has been checked and approved by the attending provider.

## 2023-07-21 NOTE — Progress Notes (Cosign Needed Addendum)
 Bladder Scan completed today.  Patient can void prior to the bladder scan. Bladder scan result: 0  Performed By: Guss Bunde, CMA  Additional notes- patient having complaints of incomplete bladder emptying, MD to see after

## 2023-07-21 NOTE — Telephone Encounter (Signed)
 CALLED PATIENT TO REMIND OF HDR TX. FOR 07-22-23 @ 3 PM, LVM FOR A RETURN CALL

## 2023-07-21 NOTE — Progress Notes (Signed)
 07/21/2023 10:35 AM   Joan [name] 03/25/1956 [id number]  Referring provider: Omie Bickers, MD [address] [place],  [place] [address]  Followup gross hematuria   HPI: Joan [name] is a [phone] here for followup for hydronephrosis and gross hematuria. Her gross hematuria resolved with bactrim . She has new urinary frequency, urgency and nocturia. She is taking flomax  0.4mg  daily which is causing urinary frequency.    PMH: Past Medical History:  Diagnosis Date   Cancer [facility])    Endometrial   Colitis, ischemic (HCC) 2003   Headaches, cluster    Hypercholesterolemia    Hypertension    Insomnia    Multiple sclerosis (HCC) 05/29/2018   Neuromuscular disorder (HCC)    Multiple Sclerosis   PE (pulmonary embolism)    ?birth control, in 20s   PONV (postoperative nausea and vomiting)    S/P colonoscopy [id number]   2004: tubular adeboma, 2007: hyperplastic polyps. Due 2012   Seasonal allergies    Tobacco abuse    Tubular adenoma 2004   colonoscopy 2004    Surgical History: Past Surgical History:  Procedure Laterality Date   BREAST BIOPSY Left    benign   COLONOSCOPY  09/17/05   20 cm polyp removed/bx/middescending colon polyp removed with snare   COLONOSCOPY  2003   tubular adenoma removed from cecum    COLONOSCOPY  03/31/2011   RMR, diverticulosis, multiple colon polyps removed (tubular adenomas). next TCS 03/2016   COLONOSCOPY N/A 10/22/2015   Procedure: COLONOSCOPY;  Surgeon: Suzette Espy, MD;  [facility]: [facility];  Service: Endoscopy;  Laterality: N/A;  730   COLONOSCOPY N/A 02/05/2021   Procedure: COLONOSCOPY;  Surgeon: Suzette Espy, MD;  Location: [facility];  Service: Endoscopy;  Laterality: N/A;  ASA II / 9:30   ESOPHAGOGASTRODUODENOSCOPY N/A 02/15/2015   RMR, mild reflux esophagitis. empirical dilation of esophagus.    LYMPH NODE DISSECTION N/A 04/21/2023   Procedure: LYMPH NODE DISSECTION;  Surgeon: Suzi Essex, MD;  Location: [facility];   Service: Gynecology;  Laterality: N/A;   MALONEY DILATION N/A 02/15/2015   Procedure: Londa Rival DILATION;  Surgeon: Suzette Espy, MD;  [facility]: [facility];  Service: Endoscopy;  Laterality: N/A;   POLYPECTOMY  02/05/2021   Procedure: POLYPECTOMY;  Surgeon: Suzette Espy, MD;  [facility]: [facility];  Service: Endoscopy;;   ROBOTIC ASSISTED TOTAL HYSTERECTOMY WITH BILATERAL SALPINGO OOPHERECTOMY Bilateral 04/21/2023   Procedure: XI ROBOTIC ASSISTED TOTAL HYSTERECTOMY WITH BILATERAL SALPINGO OOPHORECTOMY; OMENTECTOMY;  Surgeon: Suzi Essex, MD;  Location: [facility];  Service: Gynecology;  Laterality: Bilateral;   SENTINEL NODE BIOPSY N/A 04/21/2023   Procedure: SENTINEL NODE BIOPSY;  Surgeon: Suzi Essex, MD;  Location: [facility];  Service: Gynecology;  Laterality: N/A;   TUBAL LIGATION      Home Medications:  Allergies as of 07/21/2023   No Known Allergies      Medication List        Accurate as of July 21, 2023 10:35 AM. If you have any questions, ask your nurse or doctor.          Dimethyl Fumarate  240 MG Cpdr Take 1 capsule (240 mg total) by mouth 2 (two) times daily.   oxyCODONE -acetaminophen  5-325 MG tablet Commonly known as: PERCOCET/ROXICET Take 1 tablet by mouth every 6 (six) hours as needed for severe pain (pain score 7-10).   prochlorperazine  10 MG tablet Commonly known as: COMPAZINE  Take 1 tablet (10 mg total) by  mouth every 6 (six) hours as needed (nausea or vomiting).   rosuvastatin  10 MG tablet Commonly known as: CRESTOR  Take 1 tablet (10 mg total) by mouth daily.   sulfamethoxazole -trimethoprim  800-160 MG tablet Commonly known as: BACTRIM  DS Take 1 tablet by mouth 2 (two) times daily.   tamsulosin  0.4 MG Caps capsule Commonly known as: FLOMAX  Take 1 capsule (0.4 mg total) by mouth daily after supper.   valsartan -hydrochlorothiazide  80-12.5 MG tablet Commonly known as: DIOVAN -HCT Take 1 tablet by mouth daily.   Vitamin D  50 MCG (2000 UT)  tablet Take 2,000-4,000 Units by mouth See admin instructions. Take 2000 units by mouth every other day, alternating with 4000 units on alternate days        Allergies: No Known Allergies  Family History: Family History  Problem Relation Age of Onset   CVA Father    Hypertension Father    Breast cancer Maternal Aunt    Breast cancer Paternal Aunt    Cervical cancer Other    Colon cancer Neg Hx    Ovarian cancer Neg Hx    Endometrial cancer Neg Hx    Pancreatic cancer Neg Hx    Prostate cancer Neg Hx     Social History:  reports that she has been smoking cigarettes. She has a 15 pack-year smoking history. She has never used smokeless tobacco. She reports that she does not drink alcohol and does not use drugs.  ROS: All other review of systems were reviewed and are negative except what is noted above in HPI  Physical Exam: BP (!) 90/56   Pulse (!) 111   Constitutional:  Alert and oriented, No acute distress. HEENT: [place] AT, moist mucus membranes.  Trachea midline, no masses. Cardiovascular: No clubbing, cyanosis, or edema. Respiratory: Normal respiratory effort, no increased work of breathing. GI: Abdomen is soft, nontender, nondistended, no abdominal masses GU: No CVA tenderness.  Lymph: No cervical or inguinal lymphadenopathy. Skin: No rashes, bruises or suspicious lesions. Neurologic: Grossly intact, no focal deficits, moving all 4 extremities. Psychiatric: Normal mood and affect.  Laboratory Data: Lab Results  Component Value Date   WBC 2.7 (L) 07/14/2023   HGB 10.4 (L) 07/14/2023   HCT 29.2 (L) 07/14/2023   MCV 97.3 07/14/2023   PLT 190 07/14/2023    Lab Results  Component Value Date   CREATININE 1.11 (H) 07/14/2023    No results found for: "PSA"  No results found for: "TESTOSTERONE"  No results found for: "HGBA1C"  Urinalysis    Component Value Date/Time   COLORURINE YELLOW 07/14/2023 0843   APPEARANCEUR Cloudy (A) 07/16/2023 1121   LABSPEC 1.015  07/14/2023 0843   PHURINE 6.0 07/14/2023 0843   GLUCOSEU Negative 07/16/2023 1121   HGBUR LARGE (A) 07/14/2023 0843   BILIRUBINUR Negative 07/16/2023 1121   KETONESUR NEGATIVE 07/14/2023 0843   PROTEINUR 3+ (A) 07/16/2023 1121   PROTEINUR >=300 (A) 07/14/2023 0843   UROBILINOGEN 1.0 09/19/2008 0806   NITRITE Negative 07/16/2023 1121   NITRITE NEGATIVE 07/14/2023 0843   LEUKOCYTESUR Trace (A) 07/16/2023 1121   LEUKOCYTESUR SMALL (A) 07/14/2023 0843    Lab Results  Component Value Date   LABMICR See below: 07/16/2023   WBCUA 0-5 07/16/2023   LABEPIT 0-10 07/16/2023   BACTERIA None seen 07/16/2023    Pertinent Imaging:  No results found for this or any previous visit.  No results found for this or any previous visit.  No results found for this or any previous visit.  No results  found for this or any previous visit.  No results found for this or any previous visit.  No results found for this or any previous visit.  No results found for this or any previous visit.  Results for orders placed during the hospital encounter of 07/14/23  CT Renal Stone Study  Narrative CLINICAL DATA:  Right flank pain radiating to groin, hematuria  EXAM: CT ABDOMEN AND PELVIS WITHOUT CONTRAST  TECHNIQUE: Multidetector CT imaging of the abdomen and pelvis was performed following the standard protocol without IV contrast.  RADIATION DOSE REDUCTION: This exam was performed according to the departmental dose-optimization program which includes automated exposure control, adjustment of the mA and/or kV according to patient size and/or use of iterative reconstruction technique.  COMPARISON:  03/05/2023  FINDINGS: Lower chest: No acute pleural or parenchymal lung disease.  Hepatobiliary: Unremarkable unenhanced appearance of the liver and gallbladder.  Pancreas: Unremarkable unenhanced appearance.  Spleen: Unremarkable unenhanced appearance.  Adrenals/Urinary Tract: There is  moderate right-sided hydronephrosis and hydroureter, with fat stranding surrounding the mid right ureter. I do not see any obstructing calculus or mass on this unenhanced exam. Stable exophytic simple cyst upper pole right kidney does not require specific follow-up.  The left kidney is unremarkable. Bladder is decompressed, limiting its evaluation. No evidence of bladder calculi. The adrenals are unremarkable.  Stomach/Bowel: No bowel obstruction or ileus. Normal appendix right lower quadrant. No bowel wall thickening or inflammatory change.  Vascular/Lymphatic: Aortic atherosclerosis. No enlarged abdominal or pelvic lymph nodes.  Reproductive: Status post hysterectomy. No adnexal masses.  Other: No free fluid or free intraperitoneal gas. No abdominal wall hernia.  Musculoskeletal: No acute or destructive bony abnormalities. Reconstructed images demonstrate no additional findings.  IMPRESSION: 1. Moderate right-sided hydronephrosis and hydroureter, with fat stranding surrounding the mid right ureter. I do not see a clear cause for obstruction on this exam, with no radiopaque calculus identified. Please correlate with urinalysis. If further evaluation is desired, retrograde ureteroscopy could be considered. 2.  Aortic Atherosclerosis (ICD10-I70.0).   Electronically Signed By: Bobbye Burrow M.D. On: 07/14/2023 19:07   Assessment & Plan:    1. Kidney cysts -followup 3 months with renal US  - Urinalysis, Routine w reflex microscopic  2. Feeling of incomplete bladder emptying (Primary) -continue flomax  0.4mg  daily - BLADDER SCAN AMB NON-IMAGING  3. Gross hematuria -continue bactrim  for 1 week.  - Urine Culture -CT hematuria  4. Urinary urgency Gemtesa  75mg  daily   No follow-ups on file.  Johnie Nailer, MD  [facility] [place]

## 2023-07-22 ENCOUNTER — Ambulatory Visit
Admission: RE | Admit: 2023-07-22 | Discharge: 2023-07-22 | Disposition: A | Discharge status: Home / Self Care | Source: Ambulatory Visit | Attending: Radiation Oncology | Admitting: Radiation Oncology

## 2023-07-22 ENCOUNTER — Other Ambulatory Visit: Payer: Self-pay

## 2023-07-22 DIAGNOSIS — C530 Malignant neoplasm of endocervix: Secondary | ICD-10-CM

## 2023-07-22 DIAGNOSIS — C541 Malignant neoplasm of endometrium: Secondary | ICD-10-CM | POA: Diagnosis not present

## 2023-07-22 DIAGNOSIS — R319 Hematuria, unspecified: Secondary | ICD-10-CM | POA: Diagnosis not present

## 2023-07-22 LAB — RAD ONC ARIA SESSION SUMMARY
Course Elapsed Days: 17
Plan Fractions Treated to Date: 2
Plan Prescribed Dose Per Fraction: 6 Gy
Plan Total Fractions Prescribed: 4
Plan Total Prescribed Dose: 24 Gy
Reference Point Dosage Given to Date: 12 Gy
Reference Point Session Dosage Given: 6 Gy
Session Number: 2

## 2023-07-22 LAB — BUN & CREATININE (CHCC)
BUN: 22 mg/dL (ref 8–23)
Creatinine: 0.93 mg/dL (ref 0.44–1.00)
GFR, Estimated: >60 mL/min (ref >60)

## 2023-07-23 LAB — URINE CULTURE: Organism ID, Bacteria: NO GROWTH

## 2023-07-26 ENCOUNTER — Telehealth: Payer: Self-pay | Admitting: Urology

## 2023-07-26 NOTE — Telephone Encounter (Addendum)
 Patient in office and made aware that urine culture was negative. Patient asked to have Omnicef refilled instead of Bactrim prescribed by Dr. Claretta Croft. Patient made aware that Omnicef was prescribed by a different provider that is not a part of our practice and that she should contact them for any refills. Patient voiced understanding.

## 2023-07-26 NOTE — Telephone Encounter (Signed)
 Patient left message she picked up Sulfamethoxazole and wants to make sure it is the correct medication.

## 2023-07-27 NOTE — Progress Notes (Signed)
  Radiation Oncology         [phone] ________________________________  Name: Joan [name] MRN: [id number]  Date: 07/29/2023  DOB: April 28, 1955  CC: Omie Bickers, MD  Suzi Essex, MD  HDR BRACHYTHERAPY NOTE  DIAGNOSIS: Stage IB poorly differentiated carcinoma of the cervix    Simple treatment device note: Patient had construction of her custom vaginal cylinder. She will be treated with a 3.0 cm diameter segmented cylinder. This conforms to her anatomy without undue discomfort.  Vaginal brachytherapy procedure node: The patient was brought to the HDR suite. Identity was confirmed. All relevant records and images related to the planned course of therapy were reviewed. The patient freely provided informed written consent to proceed with treatment after reviewing the details related to the planned course of therapy. The consent form was witnessed and verified by the simulation staff. Then, the patient was set-up in a stable reproducible supine position for radiation therapy. Pelvic exam revealed the vaginal cuff to be intact . The patient's custom vaginal cylinder was placed in the proximal vagina. This was affixed to the CT/MR stabilization plate to prevent slippage. Patient tolerated the placement well.  Verification simulation note:  A fiducial marker was placed within the vaginal cylinder. An AP and lateral film was then obtained through the pelvis area. This documented accurate position of the vaginal cylinder for treatment.  HDR BRACHYTHERAPY TREATMENT  The remote afterloading device was affixed to the vaginal cylinder by catheter. Patient then proceeded to undergo her third high-dose-rate treatment directed at the proximal vagina. The patient was prescribed a dose of 6.0 gray to be delivered to the mucosal surface. Treatment length was 2.5 cm. Patient was treated with 1 channel using 6 dwell positions. Treatment time was 171.1 seconds. Iridium 192 was the high-dose-rate source  for treatment. The patient tolerated the treatment well. After completion of her therapy, a radiation survey was performed documenting return of the iridium source into the GammaMed safe.   PLAN: The patient will return next week for her fourth high-dose-rate treatment. ________________________________  -----------------------------------  Noralee Beam, PhD, MD  This document serves as a record of services personally performed by Retta Caster, MD. It was created on his behalf by Lucky Sable, a trained medical scribe. The creation of this record is based on the scribe's personal observations and the provider's statements to them. This document has been checked and approved by the attending provider.

## 2023-07-28 ENCOUNTER — Telehealth: Payer: Self-pay | Admitting: *Deleted

## 2023-07-28 DIAGNOSIS — N1330 Unspecified hydronephrosis: Secondary | ICD-10-CM | POA: Diagnosis not present

## 2023-07-28 DIAGNOSIS — N134 Hydroureter: Secondary | ICD-10-CM | POA: Diagnosis not present

## 2023-07-28 DIAGNOSIS — C538 Malignant neoplasm of overlapping sites of cervix uteri: Secondary | ICD-10-CM | POA: Diagnosis not present

## 2023-07-28 DIAGNOSIS — I700 Atherosclerosis of aorta: Secondary | ICD-10-CM | POA: Diagnosis not present

## 2023-07-28 DIAGNOSIS — R319 Hematuria, unspecified: Secondary | ICD-10-CM | POA: Diagnosis not present

## 2023-07-28 DIAGNOSIS — Z923 Personal history of irradiation: Secondary | ICD-10-CM | POA: Diagnosis not present

## 2023-07-28 DIAGNOSIS — Z7901 Long term (current) use of anticoagulants: Secondary | ICD-10-CM | POA: Diagnosis not present

## 2023-07-28 NOTE — Telephone Encounter (Signed)
 CALLED PATIENT TO REMIND OF HDR TX. FOR 07-29-23 @ 3 PM, LVM FOR A RETURN CALL

## 2023-07-29 ENCOUNTER — Other Ambulatory Visit: Payer: Self-pay

## 2023-07-29 ENCOUNTER — Ambulatory Visit
Admission: RE | Admit: 2023-07-29 | Discharge: 2023-07-29 | Disposition: A | Discharge status: Home / Self Care | Source: Ambulatory Visit | Attending: Radiation Oncology | Admitting: Radiation Oncology

## 2023-07-29 DIAGNOSIS — C530 Malignant neoplasm of endocervix: Secondary | ICD-10-CM

## 2023-07-29 DIAGNOSIS — C541 Malignant neoplasm of endometrium: Secondary | ICD-10-CM | POA: Diagnosis not present

## 2023-07-29 DIAGNOSIS — R319 Hematuria, unspecified: Secondary | ICD-10-CM | POA: Diagnosis not present

## 2023-07-29 LAB — RAD ONC ARIA SESSION SUMMARY
Course Elapsed Days: 24
Plan Fractions Treated to Date: 3
Plan Prescribed Dose Per Fraction: 6 Gy
Plan Total Fractions Prescribed: 4
Plan Total Prescribed Dose: 24 Gy
Reference Point Dosage Given to Date: 18 Gy
Reference Point Session Dosage Given: 6 Gy
Session Number: 3

## 2023-07-30 ENCOUNTER — Ambulatory Visit (HOSPITAL_COMMUNITY)
Admission: RE | Admit: 2023-07-30 | Discharge: 2023-07-30 | Disposition: A | Discharge status: Home / Self Care | Source: Ambulatory Visit | Attending: Urology | Admitting: Urology

## 2023-07-30 DIAGNOSIS — K429 Umbilical hernia without obstruction or gangrene: Secondary | ICD-10-CM | POA: Diagnosis not present

## 2023-07-30 DIAGNOSIS — R310 Gross hematuria: Secondary | ICD-10-CM | POA: Diagnosis not present

## 2023-07-30 DIAGNOSIS — K5730 Diverticulosis of large intestine without perforation or abscess without bleeding: Secondary | ICD-10-CM | POA: Diagnosis not present

## 2023-07-30 DIAGNOSIS — K5900 Constipation, unspecified: Secondary | ICD-10-CM | POA: Diagnosis not present

## 2023-07-30 DIAGNOSIS — N281 Cyst of kidney, acquired: Secondary | ICD-10-CM | POA: Diagnosis not present

## 2023-07-30 MED ORDER — IOHEXOL 300 MG/ML  SOLN
100.0000 mL | Freq: Once | INTRAMUSCULAR | Status: AC | PRN
  Administered 2023-07-30: 100 mL via INTRAVENOUS

## 2023-08-01 ENCOUNTER — Encounter: Payer: Self-pay | Admitting: Urology

## 2023-08-01 NOTE — Patient Instructions (Signed)
 Blood in the Pee (Hematuria) in Adults: What to Know  Hematuria is blood in the pee. You may be able to see blood in the pee. In some cases, a health care provider may find blood with a test.  Blood in the pee can be caused by infections of the kidney, bladder, or the urethra. The urethra is the tube that drains pee from the bladder.  Other causes may include: Kidney stones. Infection of the prostate. Cancer. Too much calcium in the pee. Conditions that are passed from parent to child. Too much exercise. Infections can be treated with medicine. A kidney stone will usually leave your body when you pee. If infections or kidney stones didn't cause the blood in the urine, then more tests may be needed. It is very important to tell your provider about any blood in your pee, even if you have no pain or the blood stops with no treatment. Blood in the pee can be a sign of a very serious problem, such as cancer. Follow these instructions at home: Medicines Take your medicines only as told. If you were given antibiotics, take them as told. Do not stop taking them even if you start to feel better. Eating and drinking Drink more fluids as told. Aim to drink 3-4 quarts (2.8-3.8 L) a day. Avoid caffeine, tea, and carbonated drinks. These can bother the bladder. Avoid alcohol if a female because it may irritate the prostate. General instructions If you have been diagnosed with a kidney stone, strain your pee to catch the stone if told by your provider. Empty your bladder often. Avoid holding pee for a long time. If you're female, make sure that: You wipe from front to back after using the bathroom. You use each piece of toilet paper only once. You pee before and after sex. It's up to you to get the results of any tests. Ask when your results will be ready and how to get them. You may need to call or meet with your provider to get your results. Keep all follow-up visits. Your provider will need to know  about any changes or any new symptoms. Contact a health care provider if: Your symptoms don't get better after 3 days. Your symptoms get worse. You have back pain or belly pain. You have a fever or chills. You throw up or feel like you may throw up. You throw up every time you take medicine. Get help right away if: You pass blood clots in your pee. You pass out. These symptoms may be an emergency. Call 911 right away. Do not wait to see if the symptoms will go away. Do not drive yourself to the hospital. This information is not intended to replace advice given to you by your health care provider. Make sure you discuss any questions you have with your health care provider. Document Revised: 01/14/2023 Document Reviewed: 12/24/2022 Elsevier Patient Education  2024 [employer].

## 2023-08-04 ENCOUNTER — Telehealth: Payer: Self-pay | Admitting: *Deleted

## 2023-08-04 NOTE — Progress Notes (Signed)
  Radiation Oncology         [phone] ________________________________  Name: Joan [name] MRN: [id number]  Date: 08/05/2023  DOB: 06-Feb-1956  CC: Omie Bickers, MD  Suzi Essex, MD  HDR BRACHYTHERAPY NOTE  DIAGNOSIS: Stage IB poorly differentiated carcinoma of the cervix    Simple treatment device note: Patient had construction of her custom vaginal cylinder. She will be treated with a 3.0 cm diameter segmented cylinder. This conforms to her anatomy without undue discomfort.  Vaginal brachytherapy procedure node: The patient was brought to the HDR suite. Identity was confirmed. All relevant records and images related to the planned course of therapy were reviewed. The patient freely provided informed written consent to proceed with treatment after reviewing the details related to the planned course of therapy. The consent form was witnessed and verified by the simulation staff. Then, the patient was set-up in a stable reproducible supine position for radiation therapy. Pelvic exam revealed the vaginal cuff to be intact . The patient's custom vaginal cylinder was placed in the proximal vagina. This was affixed to the CT/MR stabilization plate to prevent slippage. Patient tolerated the placement well.  Verification simulation note:  A fiducial marker was placed within the vaginal cylinder. An AP and lateral film was then obtained through the pelvis area. This documented accurate position of the vaginal cylinder for treatment.  HDR BRACHYTHERAPY TREATMENT  The remote afterloading device was affixed to the vaginal cylinder by catheter. Patient then proceeded to undergo her fourth high-dose-rate treatment directed at the proximal vagina. The patient was prescribed a dose of 6.0 gray to be delivered to the mucosal surface. Treatment length was 2.5 cm. Patient was treated with 1 channel using 6 dwell positions. Treatment time was 182.8 seconds. Iridium 192 was the high-dose-rate source  for treatment. The patient tolerated the treatment well. After completion of her therapy, a radiation survey was performed documenting return of the iridium source into the GammaMed safe.   PLAN: The patient has completed her postoperative treatment including external beam radiation therapy at [facility] and brachytherapy here in [place].  Routine follow-up in 1 month. ________________________________  -----------------------------------  Noralee Beam, PhD, MD  This document serves as a record of services personally performed by Retta Caster, MD. It was created on his behalf by Lucky Sable, a trained medical scribe. The creation of this record is based on the scribe's personal observations and the provider's statements to them. This document has been checked and approved by the attending provider.

## 2023-08-04 NOTE — Telephone Encounter (Signed)
 CALLED PATIENT TO REMIND OF HDR TX. FOR 08-05-23 @ 3 PM, SPOKE WITH PATIENT AND SHE IS AWARE OF THIS TX.

## 2023-08-05 ENCOUNTER — Other Ambulatory Visit: Payer: Self-pay

## 2023-08-05 ENCOUNTER — Ambulatory Visit
Admission: RE | Admit: 2023-08-05 | Discharge: 2023-08-05 | Disposition: A | Discharge status: Home / Self Care | Source: Ambulatory Visit | Attending: Radiation Oncology | Admitting: Radiation Oncology

## 2023-08-05 DIAGNOSIS — C541 Malignant neoplasm of endometrium: Secondary | ICD-10-CM | POA: Diagnosis not present

## 2023-08-05 DIAGNOSIS — C530 Malignant neoplasm of endocervix: Secondary | ICD-10-CM

## 2023-08-05 DIAGNOSIS — R319 Hematuria, unspecified: Secondary | ICD-10-CM | POA: Diagnosis not present

## 2023-08-05 LAB — RAD ONC ARIA SESSION SUMMARY
Course Elapsed Days: 31
Plan Fractions Treated to Date: 4
Plan Prescribed Dose Per Fraction: 6 Gy
Plan Total Fractions Prescribed: 4
Plan Total Prescribed Dose: 24 Gy
Reference Point Dosage Given to Date: 24 Gy
Reference Point Session Dosage Given: 6 Gy
Session Number: 4

## 2023-08-06 NOTE — Radiation Completion Notes (Addendum)
  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   The patient will return in one month and will continue follow up with Dr. Orvil Bland as well.      Amiel Kalata, PA-C

## 2023-08-09 ENCOUNTER — Other Ambulatory Visit (HOSPITAL_COMMUNITY): Payer: Self-pay

## 2023-08-11 ENCOUNTER — Encounter: Payer: Self-pay | Admitting: Urology

## 2023-08-11 ENCOUNTER — Ambulatory Visit: Admitting: Urology

## 2023-08-11 VITALS — BP 98/63 | HR 92

## 2023-08-11 DIAGNOSIS — N281 Cyst of kidney, acquired: Secondary | ICD-10-CM | POA: Diagnosis not present

## 2023-08-11 DIAGNOSIS — R310 Gross hematuria: Secondary | ICD-10-CM

## 2023-08-11 DIAGNOSIS — Z87898 Personal history of other specified conditions: Secondary | ICD-10-CM

## 2023-08-11 LAB — URINALYSIS, ROUTINE W REFLEX MICROSCOPIC
Bilirubin, UA: NEGATIVE
Glucose, UA: NEGATIVE
Ketones, UA: NEGATIVE
Leukocytes,UA: NEGATIVE
Nitrite, UA: NEGATIVE
Protein,UA: NEGATIVE
RBC, UA: NEGATIVE
Specific Gravity, UA: 1.015 (ref 1.005–1.030)
Urobilinogen, Ur: 0.2 mg/dL (ref 0.2–1.0)
pH, UA: 7 (ref 5.0–7.5)

## 2023-08-11 NOTE — Patient Instructions (Signed)
 Blood in the Pee (Hematuria) in Adults: What to Know  Hematuria is blood in the pee. You may be able to see blood in the pee. In some cases, a health care provider may find blood with a test.  Blood in the pee can be caused by infections of the kidney, bladder, or the urethra. The urethra is the tube that drains pee from the bladder.  Other causes may include: Kidney stones. Infection of the prostate. Cancer. Too much calcium in the pee. Conditions that are passed from parent to child. Too much exercise. Infections can be treated with medicine. A kidney stone will usually leave your body when you pee. If infections or kidney stones didn't cause the blood in the urine, then more tests may be needed. It is very important to tell your provider about any blood in your pee, even if you have no pain or the blood stops with no treatment. Blood in the pee can be a sign of a very serious problem, such as cancer. Follow these instructions at home: Medicines Take your medicines only as told. If you were given antibiotics, take them as told. Do not stop taking them even if you start to feel better. Eating and drinking Drink more fluids as told. Aim to drink 3-4 quarts (2.8-3.8 L) a day. Avoid caffeine, tea, and carbonated drinks. These can bother the bladder. Avoid alcohol if a female because it may irritate the prostate. General instructions If you have been diagnosed with a kidney stone, strain your pee to catch the stone if told by your provider. Empty your bladder often. Avoid holding pee for a long time. If you're female, make sure that: You wipe from front to back after using the bathroom. You use each piece of toilet paper only once. You pee before and after sex. It's up to you to get the results of any tests. Ask when your results will be ready and how to get them. You may need to call or meet with your provider to get your results. Keep all follow-up visits. Your provider will need to know  about any changes or any new symptoms. Contact a health care provider if: Your symptoms don't get better after 3 days. Your symptoms get worse. You have back pain or belly pain. You have a fever or chills. You throw up or feel like you may throw up. You throw up every time you take medicine. Get help right away if: You pass blood clots in your pee. You pass out. These symptoms may be an emergency. Call 911 right away. Do not wait to see if the symptoms will go away. Do not drive yourself to the hospital. This information is not intended to replace advice given to you by your health care provider. Make sure you discuss any questions you have with your health care provider. Document Revised: 01/14/2023 Document Reviewed: 12/24/2022 Elsevier Patient Education  2024 [employer].

## 2023-08-11 NOTE — Progress Notes (Signed)
 08/11/2023 9:32 AM   Joan [name] 03-01-1956 [id number]  Referring provider: Omie Bickers, MD [address] [place],  [place] [address]  Followup hydronephrosis   HPI: Joan [name] is a [phone] here for followup for right flank pain and right hydronephrosis. CT hematuria showed resolution of right hydronephrosis. She had a thick walled bladder consistent with known cystitis. No worsneing LUTS. No recent hematuria or dysuria   PMH: Past Medical History:  Diagnosis Date   Cancer [facility])    Endometrial   Colitis, ischemic (HCC) 2003   Headaches, cluster    Hypercholesterolemia    Hypertension    Insomnia    Multiple sclerosis (HCC) 05/29/2018   Neuromuscular disorder (HCC)    Multiple Sclerosis   PE (pulmonary embolism)    ?birth control, in 37s   PONV (postoperative nausea and vomiting)    S/P colonoscopy [id number]   2004: tubular adeboma, 2007: hyperplastic polyps. Due 2012   Seasonal allergies    Tobacco abuse    Tubular adenoma 2004   colonoscopy 2004    Surgical History: Past Surgical History:  Procedure Laterality Date   BREAST BIOPSY Left    benign   COLONOSCOPY  09/17/05   20 cm polyp removed/bx/middescending colon polyp removed with snare   COLONOSCOPY  2003   tubular adenoma removed from cecum    COLONOSCOPY  03/31/2011   RMR, diverticulosis, multiple colon polyps removed (tubular adenomas). next TCS 03/2016   COLONOSCOPY N/A 10/22/2015   Procedure: COLONOSCOPY;  Surgeon: Suzette Espy, MD;  [facility]: [facility];  Service: Endoscopy;  Laterality: N/A;  730   COLONOSCOPY N/A 02/05/2021   Procedure: COLONOSCOPY;  Surgeon: Suzette Espy, MD;  Location: [facility];  Service: Endoscopy;  Laterality: N/A;  ASA II / 9:30   ESOPHAGOGASTRODUODENOSCOPY N/A 02/15/2015   RMR, mild reflux esophagitis. empirical dilation of esophagus.    LYMPH NODE DISSECTION N/A 04/21/2023   Procedure: LYMPH NODE DISSECTION;  Surgeon: Suzi Essex, MD;  Location: [facility];  Service: Gynecology;  Laterality: N/A;   MALONEY DILATION N/A 02/15/2015   Procedure: Londa Rival DILATION;  Surgeon: Suzette Espy, MD;  Location: [facility];  Service: Endoscopy;  Laterality: N/A;   POLYPECTOMY  02/05/2021   Procedure: POLYPECTOMY;  Surgeon: Suzette Espy, MD;  Location: [facility];  Service: Endoscopy;;   ROBOTIC ASSISTED TOTAL HYSTERECTOMY WITH BILATERAL SALPINGO OOPHERECTOMY Bilateral 04/21/2023   Procedure: XI ROBOTIC ASSISTED TOTAL HYSTERECTOMY WITH BILATERAL SALPINGO OOPHORECTOMY; OMENTECTOMY;  Surgeon: Suzi Essex, MD;  Location: [facility];  Service: Gynecology;  Laterality: Bilateral;   SENTINEL NODE BIOPSY N/A 04/21/2023   Procedure: SENTINEL NODE BIOPSY;  Surgeon: Suzi Essex, MD;  Location: [facility];  Service: Gynecology;  Laterality: N/A;   TUBAL LIGATION      Home Medications:  Allergies as of 08/11/2023   No Known Allergies      Medication List        Accurate as of August 11, 2023  9:32 AM. If you have any questions, ask your nurse or doctor.          STOP taking these medications    oxyCODONE -acetaminophen  5-325 MG tablet Commonly known as: PERCOCET/ROXICET   sulfamethoxazole -trimethoprim  800-160 MG tablet Commonly known as: BACTRIM  DS       TAKE these medications    Dimethyl Fumarate  240 MG Cpdr Take 1 capsule (240 mg total) by mouth 2 (two) times daily.   Gemtesa  75 MG  Tabs Generic drug: Vibegron  Take 1 tablet (75 mg total) by mouth daily.   rosuvastatin  10 MG tablet Commonly known as: CRESTOR  Take 1 tablet (10 mg total) by mouth daily.   tamsulosin  0.4 MG Caps capsule Commonly known as: FLOMAX  Take 1 capsule (0.4 mg total) by mouth daily after supper.   valsartan -hydrochlorothiazide  80-12.5 MG tablet Commonly known as: DIOVAN -HCT Take 1 tablet by mouth daily.   Vitamin D  50 MCG (2000 UT) tablet Take 2,000-4,000 Units by mouth See admin instructions. Take 2000 units by mouth every other day, alternating  with 4000 units on alternate days        Allergies: No Known Allergies  Family History: Family History  Problem Relation Age of Onset   CVA Father    Hypertension Father    Breast cancer Maternal Aunt    Breast cancer Paternal Aunt    Cervical cancer Other    Colon cancer Neg Hx    Ovarian cancer Neg Hx    Endometrial cancer Neg Hx    Pancreatic cancer Neg Hx    Prostate cancer Neg Hx     Social History:  reports that she has been smoking cigarettes. She has a 15 pack-year smoking history. She has never used smokeless tobacco. She reports that she does not drink alcohol and does not use drugs.  ROS: All other review of systems were reviewed and are negative except what is noted above in HPI  Physical Exam: BP 98/63   Constitutional:  Alert and oriented, No acute distress. HEENT: [place] AT, moist mucus membranes.  Trachea midline, no masses. Cardiovascular: No clubbing, cyanosis, or edema. Respiratory: Normal respiratory effort, no increased work of breathing. GI: Abdomen is soft, nontender, nondistended, no abdominal masses GU: No CVA tenderness.  Lymph: No cervical or inguinal lymphadenopathy. Skin: No rashes, bruises or suspicious lesions. Neurologic: Grossly intact, no focal deficits, moving all 4 extremities. Psychiatric: Normal mood and affect.  Laboratory Data: Lab Results  Component Value Date   WBC 2.7 (L) 07/14/2023   HGB 10.4 (L) 07/14/2023   HCT 29.2 (L) 07/14/2023   MCV 97.3 07/14/2023   PLT 190 07/14/2023    Lab Results  Component Value Date   CREATININE 0.93 07/22/2023    No results found for: "PSA"  No results found for: "TESTOSTERONE"  No results found for: "HGBA1C"  Urinalysis    Component Value Date/Time   COLORURINE YELLOW 07/14/2023 0843   APPEARANCEUR Clear 07/21/2023 1026   LABSPEC 1.015 07/14/2023 0843   PHURINE 6.0 07/14/2023 0843   GLUCOSEU Negative 07/21/2023 1026   HGBUR LARGE (A) 07/14/2023 0843   BILIRUBINUR Negative  07/21/2023 1026   KETONESUR NEGATIVE 07/14/2023 0843   PROTEINUR 1+ (A) 07/21/2023 1026   PROTEINUR >=300 (A) 07/14/2023 0843   UROBILINOGEN 1.0 09/19/2008 0806   NITRITE Negative 07/21/2023 1026   NITRITE NEGATIVE 07/14/2023 0843   LEUKOCYTESUR Trace (A) 07/21/2023 1026   LEUKOCYTESUR SMALL (A) 07/14/2023 0843    Lab Results  Component Value Date   LABMICR See below: 07/21/2023   WBCUA 6-10 (A) 07/21/2023   LABEPIT 0-10 07/21/2023   BACTERIA None seen 07/21/2023    Pertinent Imaging: Ct hematuria: Images reviewed and discussed with the patient  No results found for this or any previous visit.  No results found for this or any previous visit.  No results found for this or any previous visit.  No results found for this or any previous visit.  No results found for this or any  previous visit.  No results found for this or any previous visit.  Results for orders placed in visit on 07/21/23  CT HEMATURIA WORKUP  Narrative CLINICAL DATA:  Gross hematuria. Hysterectomy 04/21/2023. Cervical cancer.  EXAM: CT ABDOMEN AND PELVIS WITHOUT AND WITH CONTRAST  TECHNIQUE: Multidetector CT imaging of the abdomen and pelvis was performed following the standard protocol before and following the bolus administration of intravenous contrast.  RADIATION DOSE REDUCTION: This exam was performed according to the departmental dose-optimization program which includes automated exposure control, adjustment of the mA and/or kV according to patient size and/or use of iterative reconstruction technique.  CONTRAST:  OMNIPAQUE  IOHEXOL  300 MG/ML  SOLN  COMPARISON:  CT without contrast 07/14/2023, CT with contrast 06/04/2023, PET-CT 05/24/2023, CT with contrast 03/05/2023.  FINDINGS: Lower chest: The cardiac size is normal. The tip of an infusion catheter terminates in the upper atrium. Lung bases are clear  Hepatobiliary: Liver is 22 cm length and mildly steatotic. No  mass enhancement. The gallbladder bile ducts are unremarkable.  Pancreas: Partially atrophic. A cystic lesion is again noted in the upper pancreatic head measuring 1.8 x 1.5 cm and 18 Hounsfield units, stable back to MRI without and with contrast 01/02/2022 and did not show enhancement previously.  There is no solid mass enhancement and no downstream ductal dilatation.  Again, this is most likely either a side branch IPMN or a postinflammatory cyst.  Consider follow-up pancreatic dedicated MRI for recharacterization.  Rest of the pancreas is unremarkable.  Spleen: Unremarkable.  Adrenals/Urinary Tract: There is no adrenal mass. Again noted are nonenhancing Bosniak 1 cysts in the superior pole of both kidneys, 5 cm right pole and 2.5 cm pole.  There are additional occasional subcentimeter too small to characterize Bosniak 2 cortical cysts in mid and lower pole of kidneys.  No follow-up imaging is recommended. There is mass enhancement stones or hydronephrosis.  The bladder is contracted and not well filled. On the delayed images bladder is slightly filled with contrast.  No discrete wall mass is seen on the delayed images. There appears to be at least mild bladder thickening and perivesical stranding changes suspicious for cystitis.  Stomach/Bowel: No dilatation or wall thickening including the appendix.  Mild fecal stasis ascending and transverse colon. Scattered uncomplicated colonic diverticulosis.  Vascular/Lymphatic: Aortic atherosclerosis. No enlarged abdominal or pelvic lymph nodes.  Reproductive: Status post hysterectomy. No adnexal masses.  Other: Small umbilical fat hernia. No incarcerated hernia. Small inguinal fat hernias. No free fluid, free hemorrhage or free air.  Musculoskeletal: Osteopenia and degenerative change of the spine. Chronic reactive peridiscal endplate sclerosis L5-S1.  Mild chronic wedge compression deformities T12, L1, L3.  A  calcified disc extrusion chronically noted T9-10 with at least mild mass-effect suspected on the distal spinal cord. Small calcified disc extrusions at both adjacent levels.  IMPRESSION: 1. No evidence of urinary tract stones, mass or hydronephrosis. 2. Contracted bladder with at least mild bladder thickening and perivesical stranding changes suspicious for cystitis. 3. Constipation and diverticulosis. 4. Aortic atherosclerosis. 5. Stable 1.8 x 1.5 cm cystic lesion in the upper pancreatic head, most likely either a side branch IPMN or a postinflammatory cyst. Consider follow-up pancreatic dedicated MRI for recharacterization and comparison to the September 2023 MRI. 6. Umbilical and inguinal fat hernias. 7. Osteopenia and degenerative change. 8. Chronic calcified disc extrusion T9-10 with at least mild mass-effect suspected on the distal spinal cord.  Aortic Atherosclerosis (ICD10-I70.0).   Electronically Signed By: Denman Fischer M.D. On:  08/02/2023 01:10  Results for orders placed during the hospital encounter of 07/14/23  CT Renal Stone Study  Narrative CLINICAL DATA:  Right flank pain radiating to groin, hematuria  EXAM: CT ABDOMEN AND PELVIS WITHOUT CONTRAST  TECHNIQUE: Multidetector CT imaging of the abdomen and pelvis was performed following the standard protocol without IV contrast.  RADIATION DOSE REDUCTION: This exam was performed according to the departmental dose-optimization program which includes automated exposure control, adjustment of the mA and/or kV according to patient size and/or use of iterative reconstruction technique.  COMPARISON:  03/05/2023  FINDINGS: Lower chest: No acute pleural or parenchymal lung disease.  Hepatobiliary: Unremarkable unenhanced appearance of the liver and gallbladder.  Pancreas: Unremarkable unenhanced appearance.  Spleen: Unremarkable unenhanced appearance.  Adrenals/Urinary Tract: There is moderate right-sided  hydronephrosis and hydroureter, with fat stranding surrounding the mid right ureter. I do not see any obstructing calculus or mass on this unenhanced exam. Stable exophytic simple cyst upper pole right kidney does not require specific follow-up.  The left kidney is unremarkable. Bladder is decompressed, limiting its evaluation. No evidence of bladder calculi. The adrenals are unremarkable.  Stomach/Bowel: No bowel obstruction or ileus. Normal appendix right lower quadrant. No bowel wall thickening or inflammatory change.  Vascular/Lymphatic: Aortic atherosclerosis. No enlarged abdominal or pelvic lymph nodes.  Reproductive: Status post hysterectomy. No adnexal masses.  Other: No free fluid or free intraperitoneal gas. No abdominal wall hernia.  Musculoskeletal: No acute or destructive bony abnormalities. Reconstructed images demonstrate no additional findings.  IMPRESSION: 1. Moderate right-sided hydronephrosis and hydroureter, with fat stranding surrounding the mid right ureter. I do not see a clear cause for obstruction on this exam, with no radiopaque calculus identified. Please correlate with urinalysis. If further evaluation is desired, retrograde ureteroscopy could be considered. 2.  Aortic Atherosclerosis (ICD10-I70.0).   Electronically Signed By: Bobbye Burrow M.D. On: 07/14/2023 19:07   Assessment & Plan:    1. Kidney cysts (Primary) Followup 6 months with renal US  - Urinalysis, Routine w reflex microscopic  2. Gross hematuria resolved   No follow-ups on file.  Johnie Nailer, MD  [facility] [place]

## 2023-08-12 ENCOUNTER — Other Ambulatory Visit (HOSPITAL_COMMUNITY): Payer: Self-pay | Admitting: Internal Medicine

## 2023-08-12 DIAGNOSIS — K862 Cyst of pancreas: Secondary | ICD-10-CM

## 2023-08-12 NOTE — Addendum Note (Signed)
 Encounter addended by: Pearlene Bouchard, PA-C on: 08/12/2023 10:27 AM  Actions taken: Clinical Note Signed

## 2023-08-16 ENCOUNTER — Encounter: Payer: Self-pay | Admitting: Neurology

## 2023-08-16 ENCOUNTER — Ambulatory Visit: Payer: PPO | Admitting: Neurology

## 2023-08-16 VITALS — BP 120/70 | HR 69 | Ht 66.0 in | Wt 203.4 lb

## 2023-08-16 DIAGNOSIS — R208 Other disturbances of skin sensation: Secondary | ICD-10-CM

## 2023-08-16 DIAGNOSIS — R269 Unspecified abnormalities of gait and mobility: Secondary | ICD-10-CM

## 2023-08-16 DIAGNOSIS — G35 Multiple sclerosis: Secondary | ICD-10-CM | POA: Diagnosis not present

## 2023-08-16 DIAGNOSIS — R200 Anesthesia of skin: Secondary | ICD-10-CM | POA: Diagnosis not present

## 2023-08-16 DIAGNOSIS — Z79899 Other long term (current) drug therapy: Secondary | ICD-10-CM

## 2023-08-16 DIAGNOSIS — G35D Multiple sclerosis, unspecified: Secondary | ICD-10-CM

## 2023-08-16 DIAGNOSIS — C530 Malignant neoplasm of endocervix: Secondary | ICD-10-CM
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
## 2023-08-18 ENCOUNTER — Ambulatory Visit (HOSPITAL_COMMUNITY)
Admission: RE | Admit: 2023-08-18 | Discharge: 2023-08-18 | Disposition: A | Discharge status: Home / Self Care | Source: Ambulatory Visit | Attending: Internal Medicine | Admitting: Internal Medicine

## 2023-08-18 ENCOUNTER — Other Ambulatory Visit (HOSPITAL_COMMUNITY): Payer: Self-pay | Admitting: Internal Medicine

## 2023-08-18 DIAGNOSIS — K862 Cyst of pancreas: Secondary | ICD-10-CM

## 2023-08-18 DIAGNOSIS — N281 Cyst of kidney, acquired: Secondary | ICD-10-CM | POA: Diagnosis not present

## 2023-08-18 DIAGNOSIS — K820 Obstruction of gallbladder: Secondary | ICD-10-CM | POA: Diagnosis not present

## 2023-08-18 DIAGNOSIS — I700 Atherosclerosis of aorta: Secondary | ICD-10-CM | POA: Diagnosis not present

## 2023-08-18 MED ORDER — GADOBUTROL 1 MMOL/ML IV SOLN
9.0000 mL | Freq: Once | INTRAVENOUS | Status: AC | PRN
  Administered 2023-08-18: 9 mL via INTRAVENOUS

## 2023-08-19 ENCOUNTER — Ambulatory Visit (HOSPITAL_COMMUNITY): Admission: RE | Admit: 2023-08-19 | Source: Ambulatory Visit

## 2023-08-25 DIAGNOSIS — Z923 Personal history of irradiation: Secondary | ICD-10-CM | POA: Diagnosis not present

## 2023-08-25 DIAGNOSIS — Z9221 Personal history of antineoplastic chemotherapy: Secondary | ICD-10-CM | POA: Diagnosis not present

## 2023-08-25 DIAGNOSIS — R5383 Other fatigue: Secondary | ICD-10-CM | POA: Diagnosis not present

## 2023-08-25 DIAGNOSIS — C539 Malignant neoplasm of cervix uteri, unspecified: Secondary | ICD-10-CM | POA: Diagnosis not present

## 2023-09-01 ENCOUNTER — Telehealth: Payer: Self-pay | Admitting: *Deleted

## 2023-09-01 NOTE — Telephone Encounter (Signed)
 CALLED PATIENT TO INFORM THAT ON HER FU APPT. ON 09/09/23 WILL BE WITH PA, ELLIE MUIR, LVM FOR A RETURN CALL

## 2023-09-07 ENCOUNTER — Encounter: Payer: Self-pay | Admitting: Radiology

## 2023-09-09 ENCOUNTER — Ambulatory Visit
Admission: RE | Admit: 2023-09-09 | Discharge: 2023-09-09 | Disposition: A | Discharge status: Home / Self Care | Source: Ambulatory Visit | Attending: Radiation Oncology | Admitting: Radiation Oncology

## 2023-09-09 ENCOUNTER — Encounter: Payer: Self-pay | Admitting: Radiology

## 2023-09-09 VITALS — BP 126/82 | HR 67 | Temp 97.8°F | Resp 18 | Ht 66.0 in | Wt 205.6 lb

## 2023-09-09 DIAGNOSIS — C530 Malignant neoplasm of endocervix: Secondary | ICD-10-CM

## 2023-09-09 HISTORY — DX: Personal history of irradiation: Z92.3

## 2023-09-09 NOTE — Progress Notes (Addendum)
 Joan [name] is here today to see Julio Ohm PA-C. for follow up post radiation to the pelvic.  They completed their radiation on: 08/05/23  Does the patient complain of any of the following:  Pain: Denies Abdominal bloating: Denies Diarrhea/Constipation: Denies Nausea/Vomiting: Denies Vaginal Discharge: Denies Blood in Urine or Stool: Denies Urinary Issues (dysuria/incomplete emptying/ incontinence/ increased frequency/urgency): Denies Does patient report using vaginal dilator 2-3 times a week and/or sexually active 2-3 weeks:  Discussed the importance of vaginal dilators and encouraged regular use.        She reports overall feeling good. She states that she is walking and has    attended fitness classes.  BP 126/82 (BP Location: Right Arm, Patient Position: Sitting, Cuff Size: Large)   Pulse 67   Temp 97.8 F (36.6 C)   Resp 18   Ht 5\' 6"  (1.676 m)   Wt 205 lb 9.6 oz (93.3 kg)   SpO2 100%   BMI 33.18 kg/m

## 2023-09-09 NOTE — Progress Notes (Signed)
 Radiation Oncology         [phone] ________________________________  Name: Joan [name] MRN: [id number]  Date: 09/09/2023  DOB: 05-20-1955  Follow-Up Visit Note  CC: Omie Bickers, MD  Suzi Essex, MD    ICD-10-CM   1. 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 of the cervix  s/p hysterectomy, BSO, and SLN excisions, followed by adjuvant chemoradiation completed on 03/19 at [facility]  and adjuvant brachytherapy on 08/05/2023  Past Radiation: ==========DELIVERED PLANS==========  First Treatment Date: 2023-07-05 Last Treatment Date: 2023-08-05   Plan Name: VagCuffHDR Site: Vagina Technique: HDR Ir-192 Mode: Brachytherapy Dose Per Fraction: 6 Gy Prescribed Dose (Delivered / Prescribed): 24 Gy / 24 Gy Prescribed Fxs (Delivered / Prescribed): 4 / 4  Interval Since Last Radiation:  1 month  Narrative:  The patient returns today for routine follow-up. The patient developed fatigue during her treatment, but tolerated the radiation well with no other significant side effects. She completed her radiation treatment one month ago.   She was last seen by her medical oncologist at [facility], Dr. Hayward Lis, on 08/25/2023. At that time, she was doing well without any significant complaints except for mild fatigue. Per Dr. Hayward Lis recommendations, the patient will remain under surveillance and return for reevaluation in 6 months.  Patient reports to be doing well overall today. She states that her energy levels have returned to baseline and denies any vaginal bleeding, or changes in her bowel or bladder habits. She is pleased with her progress to date.                    Allergies:  has no known allergies.  Meds: Current Outpatient Medications  Medication Sig Dispense Refill   Cholecalciferol  (VITAMIN D ) 50 MCG (2000 UT) tablet Take 2,000-4,000 Units by mouth See admin  instructions. Take 2000 units by mouth every other day, alternating with 4000 units on alternate days     Dimethyl Fumarate  240 MG CPDR Take 1 capsule (240 mg total) by mouth 2 (two) times daily. 180 capsule 1   rosuvastatin  (CRESTOR ) 10 MG tablet Take 1 tablet (10 mg total) by mouth daily. 90 tablet 1   valsartan -hydrochlorothiazide  (DIOVAN -HCT) 80-12.5 MG tablet Take 1 tablet by mouth daily. 90 tablet 0   tamsulosin  (FLOMAX ) 0.4 MG CAPS capsule Take 1 capsule (0.4 mg total) by mouth daily after supper. (Patient not taking: Reported on 09/09/2023) 30 capsule 0   Vibegron  (GEMTESA ) 75 MG TABS Take 1 tablet (75 mg total) by mouth daily. (Patient not taking: Reported on 09/09/2023)     No current facility-administered medications for this encounter.    Physical Findings: The patient is in no acute distress. Patient is alert and oriented.  height is 5\' 6"  (1.676 m) and weight is 205 lb 9.6 oz (93.3 kg). Her temperature is 97.8 F (36.6 C). Her blood pressure is 126/82 and her pulse is 67. Her respiration is 18 and oxygen saturation is 100%. .  No significant changes. Lungs are clear to auscultation bilaterally. Heart has regular rate and rhythm. No palpable cervical, supraclavicular, or axillary adenopathy. Abdomen soft, non-tender, normal bowel sounds.   Lab Findings: Lab Results  Component Value Date   WBC 2.7 (L) 07/14/2023   HGB 10.4 (L) 07/14/2023   HCT 29.2 (L) 07/14/2023   MCV 97.3 07/14/2023   PLT 190 07/14/2023    Radiographic Findings:  MR ABDOMEN WWO CONTRAST Result Date: 08/18/2023 CLINICAL DATA:  Pancreatic cyst EXAM: MRI ABDOMEN WITHOUT AND WITH CONTRAST TECHNIQUE: Multiplanar multisequence MR imaging of the abdomen was performed both before and after the administration of intravenous contrast. CONTRAST:  9mL GADAVIST  GADOBUTROL  1 MMOL/ML IV SOLN COMPARISON:  CT abdomen pelvis, 07/30/2023, MR abdomen, 01/02/2022 FINDINGS: Lower chest: No acute abnormality. Hepatobiliary: No solid  liver abnormality is seen. Contracted gallbladder. Adenomyomatosis of the gallbladder fundus. No gallstones, gallbladder wall thickening, or biliary dilatation. Pancreas: Fluid signal cystic lesion in the dorsal pancreatic head measuring 1.9 x 1.5 cm (series 4, image 21). Additional tiny fluid signal cysts scattered elsewhere in the pancreas, largest in the superior pancreatic neck measuring 0.9 cm (series 4, image 17). No solid component or suspicious contrast enhancement. No pancreatic ductal dilatation or surrounding inflammatory changes. Spleen: Normal in size without significant abnormality. Adrenals/Urinary Tract: Adrenal glands are unremarkable. Simple, benign bilateral renal cortical cysts, for which no further follow-up or characterization is required. Kidneys are otherwise normal, without renal calculi, solid lesion, or hydronephrosis. Stomach/Bowel: Stomach is within normal limits. No evidence of bowel wall thickening, distention, or inflammatory changes. Vascular/Lymphatic: Aortic atherosclerosis. No enlarged abdominal lymph nodes. Other: No abdominal wall hernia or abnormality. No ascites. Musculoskeletal: No acute or significant osseous findings. IMPRESSION: Fluid signal cystic lesion in the dorsal pancreatic head measuring 1.9 x 1.5 cm. This is slightly enlarged when compared to MR dated 01/02/2022. Additional tiny fluid signal cysts scattered elsewhere in the pancreas, largest in the superior pancreatic neck measuring 0.9 cm, new compared to prior MR. No solid component or suspicious contrast enhancement. These are most consistent with IPMNs. Although very likely benign given indolent behavior over initial follow-up, recommend additional follow-up in 1 year to ensure stability or continued indolent behavior. Electronically Signed   By: Fredricka Jenny M.D.   On: 08/18/2023 20:20   MR 3D Recon At Scanner Result Date: 08/18/2023 CLINICAL DATA:  Pancreatic cyst EXAM: MRI ABDOMEN WITHOUT AND WITH  CONTRAST TECHNIQUE: Multiplanar multisequence MR imaging of the abdomen was performed both before and after the administration of intravenous contrast. CONTRAST:  9mL GADAVIST  GADOBUTROL  1 MMOL/ML IV SOLN COMPARISON:  CT abdomen pelvis, 07/30/2023, MR abdomen, 01/02/2022 FINDINGS: Lower chest: No acute abnormality. Hepatobiliary: No solid liver abnormality is seen. Contracted gallbladder. Adenomyomatosis of the gallbladder fundus. No gallstones, gallbladder wall thickening, or biliary dilatation. Pancreas: Fluid signal cystic lesion in the dorsal pancreatic head measuring 1.9 x 1.5 cm (series 4, image 21). Additional tiny fluid signal cysts scattered elsewhere in the pancreas, largest in the superior pancreatic neck measuring 0.9 cm (series 4, image 17). No solid component or suspicious contrast enhancement. No pancreatic ductal dilatation or surrounding inflammatory changes. Spleen: Normal in size without significant abnormality. Adrenals/Urinary Tract: Adrenal glands are unremarkable. Simple, benign bilateral renal cortical cysts, for which no further follow-up or characterization is required. Kidneys are otherwise normal, without renal calculi, solid lesion, or hydronephrosis. Stomach/Bowel: Stomach is within normal limits. No evidence of bowel wall thickening, distention, or inflammatory changes. Vascular/Lymphatic: Aortic atherosclerosis. No enlarged abdominal lymph nodes. Other: No abdominal wall hernia or abnormality. No ascites. Musculoskeletal: No acute or significant osseous findings. IMPRESSION: Fluid signal cystic lesion in the dorsal pancreatic head measuring 1.9 x 1.5 cm. This is slightly enlarged when compared to MR dated 01/02/2022. Additional tiny fluid signal cysts scattered elsewhere in the pancreas, largest in the superior pancreatic neck measuring 0.9 cm, new compared to prior MR. No solid component or suspicious contrast enhancement.  These are most consistent with IPMNs. Although very likely  benign given indolent behavior over initial follow-up, recommend additional follow-up in 1 year to ensure stability or continued indolent behavior. Electronically Signed   By: Fredricka Jenny M.D.   On: 08/18/2023 20:20    Impression:  Stage IIA poorly differentiated high grade invasive squamous cell carcinoma of the cervix; s/p hysterectomy, BSO, and SLN excisions, followed by adjuvant chemoradiation completed on 03/19 at [facility] and adjuvant brachytherapy on 08/05/2023  The patient has recovered well from the effects of her radiation treatment.   Discussed the importance of vaginal dilators and encouraged regular use.  Plan:  Per NCCN guidelines, patient will follow up with Dr. Orvil Bland in 2 months and radiation oncology in 5 months. Patient was educated on signs/symptoms that may indicate disease recurrence and understands to notify us  if she experiences any of them.   She will see her medical oncologist, Dr. Hayward Lis, in 6 months for restaging.   20 minutes of total time was spent for this patient encounter, including preparation, face-to-face counseling with the patient and coordination of care, physical exam, and documentation of the encounter. ____________________________________   Julio Ohm, PA-C

## 2023-10-13 DIAGNOSIS — Z452 Encounter for adjustment and management of vascular access device: Secondary | ICD-10-CM | POA: Diagnosis not present

## 2023-11-01 ENCOUNTER — Other Ambulatory Visit (HOSPITAL_COMMUNITY): Payer: Self-pay

## 2023-11-01 MED ORDER — VALSARTAN-HYDROCHLOROTHIAZIDE 80-12.5 MG PO TABS
1.0000 | ORAL_TABLET | Freq: Every day | ORAL | 0 refills | Status: DC
  Filled 2023-11-01: qty 90, 90d supply, fill #0

## 2023-11-01 MED ORDER — ROSUVASTATIN CALCIUM 10 MG PO TABS
10.0000 mg | ORAL_TABLET | Freq: Every day | ORAL | 1 refills | Status: DC
  Filled 2023-11-01: qty 90, 90d supply, fill #0
  Filled 2024-01-25: qty 90, 90d supply, fill #1

## 2023-11-23 ENCOUNTER — Encounter: Payer: Self-pay | Admitting: Gynecologic Oncology

## 2023-11-24 DIAGNOSIS — Z452 Encounter for adjustment and management of vascular access device: Secondary | ICD-10-CM | POA: Diagnosis not present

## 2023-11-26 ENCOUNTER — Encounter: Payer: Self-pay | Admitting: Gynecologic Oncology

## 2023-11-26 ENCOUNTER — Inpatient Hospital Stay: Payer: PPO | Attending: Gynecologic Oncology | Admitting: Gynecologic Oncology

## 2023-11-26 VITALS — BP 130/55 | HR 93 | Temp 98.4°F | Resp 20 | Wt 212.0 lb

## 2023-11-26 DIAGNOSIS — Z90710 Acquired absence of both cervix and uterus: Secondary | ICD-10-CM | POA: Insufficient documentation

## 2023-11-26 DIAGNOSIS — Z7189 Other specified counseling: Secondary | ICD-10-CM

## 2023-11-26 DIAGNOSIS — Z9221 Personal history of antineoplastic chemotherapy: Secondary | ICD-10-CM | POA: Diagnosis not present

## 2023-11-26 DIAGNOSIS — Z08 Encounter for follow-up examination after completed treatment for malignant neoplasm: Secondary | ICD-10-CM | POA: Insufficient documentation

## 2023-11-26 DIAGNOSIS — Z8541 Personal history of malignant neoplasm of cervix uteri: Secondary | ICD-10-CM | POA: Diagnosis not present

## 2023-11-26 DIAGNOSIS — Z9079 Acquired absence of other genital organ(s): Secondary | ICD-10-CM | POA: Insufficient documentation

## 2023-11-26 DIAGNOSIS — C539 Malignant neoplasm of cervix uteri, unspecified: Secondary | ICD-10-CM

## 2023-11-26 DIAGNOSIS — Z90722 Acquired absence of ovaries, bilateral: Secondary | ICD-10-CM | POA: Diagnosis not present

## 2023-11-26 DIAGNOSIS — Z923 Personal history of irradiation: Secondary | ICD-10-CM | POA: Insufficient documentation

## 2023-11-26 DIAGNOSIS — K869 Disease of pancreas, unspecified: Secondary | ICD-10-CM

## 2023-11-26 DIAGNOSIS — E66811 Obesity, class 1: Secondary | ICD-10-CM

## 2023-11-26 NOTE — Patient Instructions (Signed)
 It was good to see you today.  I do not see or feel any evidence of cancer recurrence on your exam.  I will see you for follow-up in 6 months.  We will plan to repeat Pap test at that time.  2 apps that we talked about in terms of tracking your food and beverage intake are [employer] it!  And my fitness pal.  As always, if you develop any new and concerning symptoms before your next visit, please call to see me sooner.

## 2023-11-26 NOTE — Progress Notes (Signed)
 Gynecologic Oncology Return Clinic Visit  11/26/23  Reason for Visit: follow-up  Treatment History: Oncology History Overview Note  Patient initially noticed postmenopausal bleeding in November.  PDL1 CPS 1%   Cervix cancer (HCC)  03/05/2023 Imaging   CT A/P: 1. No specific urinary tract abnormality is identified to explain the patient's hematuria. 2. Abnormal fluid density widening of the endometrial stripe 2.2 cm in diameter. Further investigation with dedicated pelvic sonography is recommended to exclude underlying malignancy. 3. Fluid density lesion in the head of the pancreas measuring 1.7 by 0.9 by 1.5 cm. Retrospectively stable and nonenhancing on MRI of 01/02/2022, establishing 1 year stability. Possibilities include intraductal papillary mucinous neoplasm or postinflammatory cystic lesion. Follow up pancreatic protocol MRI with and without contrast is recommended in 6 months time 4 standard surveillance. Alternatively, endoscopic ultrasound with fine-needle aspiration could be performed. This recommendation follows ACR consensus guidelines: Management of Incidental Pancreatic Cysts: A White Paper of the ACR Incidental Findings Committee. J Am Coll Radiol 2017;14:911-923. 4. Aortic atherosclerosis. 5. Lower thoracic and lumbar spondylosis and degenerative disc disease.   03/17/2023 Imaging   Pelvic ultrasound: The uterus is anteverted in position and measures 8.0 x 5.3 x 6.0 cm. It demonstrates a 4.4 x 3.4 cm cystic area, containing some debris, that occupies the majority of the body and fundus. There is a 2.5 x 1.7 x 1.7 cm hyperechoic lesion with irregular borders within the cervix that appears to demonstrate multiple echogenic foci and internal vascularity. The endometrium is not visualized.   The bilateral ovaries are not identified.   There is no fluid present within the cul-de-sac.   04/01/2023 Initial Biopsy   Endometrial biopsy shows poorly differentiated endometrial  adenocarcinoma, p16 positive, p53 abnormal.   04/15/2023 Initial Diagnosis   Endometrial cancer (HCC)   04/21/2023 Surgery   Robotic-assisted laparoscopic total hysterectomy with bilateral salpingoophorectomy, SLN biopsy, selective right obturator lymphadenectomy, omentectomy   On EUA, 10 cm mobile uterus. On intra-abdominal entry, normal upper abdominal survey. Normal appearing although thick omentum. Normal small and large bowel. Uterus 10 cm and normal in appearance. Some paratubal and para-ovarian cystic lesions (all less than 5 mm). NO ascites. Mapping successful to bilateral deep obturator SLNs. On the right, there were several prominent obturator LNs that were removed after identification and removal of the right SLN.    04/21/2023 Pathology Results   A. RIGHT OBTURATOR SENTINEL LYMPH NODE, EXCISION:            One benign lymph node, negative for carcinoma (0/1)  B. RIGHT PROMINENT OBTURATOR LYMPH NODE, EXCISION:           Three benign lymph nodes, negative for carcinoma (0/3)  C. LEFT OBTURATOR SENTINEL LYMPH NODE, EXCISION:            One benign lymph node, negative for carcinoma (0/1)  D. UTERUS WITH RIGHT AND LEFT FALLOPIAN TUBE AND OVARY, HYSTERECTOMY AND BILATERAL SALPINGO-OOPHORECTOMY: Invasive squamous cell carcinoma of the cervix, poorly differentiated Tumor measures 4.1 cm in greatest dimension and invades to a depth of 1.3 cm (pT1b3) Tumor extends to focally involve the ectocervical/vaginal margin Tumor invades to within 0.6 mm of the deep cervical margin Tumor circumferentially involves the cervix, endocervix and lower uterine segment Background severe squamous dysplasia to carcinoma in situ (HSIL, CIN-3) Extensive lymphatic invasion  Benign proliferative phase endometrium Benign paratubal cysts Benign fallopian tubes and ovaries  E. OMENTUM: Benign omentum, negative for malignancy  ONCOLOGY TABLE:  UTERINE CERVIX, CARCINOMA: Resection  Procedure: Total  hysterectomy and bilateral salpingo-oophorectomy Tumor Size: 4.1 x 1.3 cm in greatest dimension Histologic Type: Squamous carcinoma Histologic Grade: Poorly-differentiated/high-grade Stromal Invasion: Present      Depth of stromal invasion (mm): 13 mm Lymphatic and/or Vascular Invasion: Present, extensive Other Tissue/ Organ: Not applicable Margins:      Margins Involved by Invasive Carcinoma: Tumor present in ectocervical/vaginal margin; 0.6 mm from deep stromal cervical margin Margin Status for HSIL or AIS: HSIL present focally in ectocervical margin      Regional Lymph Nodes: Pelvic Lymph Nodes Examined:     2 Sentinel     3 non-sentinel     5 total Pelvic Lymph Nodes with Metastasis: 0 Macrometastasis: (>2.0 mm): 0     Micrometastasis: (>0.2 mm and < 2.0 mm): 0     Isolated Tumor Cells (<0.2 mm): 0     Laterality of Lymph Node with Tumor: NA     Extracapsular Extension: NA Para-aortic Lymph Nodes Examined: Not applicable Para-aortic Lymph Nodes with Metastasis: Not applicable Distant Metastasis: Not applicable Pathologic Stage Classification (pTNM, AJCC 8th Edition): pT1b3, pN0 Ancillary Studies: Available upon request. Representative Tumor Block: D1-D22 Comment(s): Immunohistochemical stains were performed with adequate control on the invasive tumor. The tumor cells are strongly and diffusely positive for CK5/6, patchy positive for p63, and are negative for p40. The findings are in keeping with a squamous cell carcinoma. An immunohistochemical stain for pancytokeratin is performed on each of the sentinel lymph nodes (specimens a and C) with adequate control and are negative for metastatic carcinoma. ([id number])    05/12/2023 - 06/30/2023 Radiation Therapy   05/12/2023 - 07/28/2023 Radiation  Radiation Therapy Treatment Details (05/12/2023 - 07/28/2023) Site: Pelvis Technique: IMRT Goal: Curative  Weekly cisplatin 70 mg (please note, < 40 mg/m2) but maximum dose recommended   5 treatments received   07/05/2023 - 08/05/2023 Radiation Therapy   Plan Name: VagCuffHDR Site: Vagina Technique: HDR Ir-192 Mode: Brachytherapy Dose Per Fraction: 6 Gy Prescribed Dose (Delivered / Prescribed): 24 Gy / 24 Gy Prescribed Fxs (Delivered / Prescribed): 4 / 4     Interval History: Doing very well.  Denies any abdominal or pelvic pain.  Denies vaginal bleeding.  Using her vaginal dilator 3 times a week.  Endorses normal bowel bladder function.  Has experienced some weight gain, working hard to be more active.  Frustrated by this and would like to start losing some weight.  Past Medical/Surgical History: Past Medical History:  Diagnosis Date   Cancer [facility])    Endometrial   Colitis, ischemic (HCC) 2003   Headaches, cluster    History of radiation therapy    Pelvis-07/05/23-08/05/23-Dr. Lynwood Nasuti   Hypercholesterolemia    Hypertension    Insomnia    Multiple sclerosis (HCC) 05/29/2018   Neuromuscular disorder (HCC)    Multiple Sclerosis   PE (pulmonary embolism)    ?birth control, in 54s   PONV (postoperative nausea and vomiting)    S/P colonoscopy [id number]   2004: tubular adeboma, 2007: hyperplastic polyps. Due 2012   Seasonal allergies    Tobacco abuse    Tubular adenoma 2004   colonoscopy 2004    Past Surgical History:  Procedure Laterality Date   BREAST BIOPSY Left    benign   COLONOSCOPY  09/17/05   20 cm polyp removed/bx/middescending colon polyp removed with snare   COLONOSCOPY  2003   tubular adenoma removed from cecum    COLONOSCOPY  03/31/2011   RMR, diverticulosis, multiple colon polyps removed (tubular adenomas). next  TCS 03/2016   COLONOSCOPY N/A 10/22/2015   Procedure: COLONOSCOPY;  Surgeon: Lamar CHRISTELLA Hollingshead, MD;  [facility];  Service: Endoscopy;  Laterality: N/A;  730   COLONOSCOPY N/A 02/05/2021   Procedure: COLONOSCOPY;  Surgeon: Hollingshead Lamar CHRISTELLA, MD;  Location: [facility];  Service: Endoscopy;  Laterality: N/A;  ASA II  / 9:30   ESOPHAGOGASTRODUODENOSCOPY N/A 02/15/2015   RMR, mild reflux esophagitis. empirical dilation of esophagus.    LYMPH NODE DISSECTION N/A 04/21/2023   Procedure: LYMPH NODE DISSECTION;  Surgeon: Viktoria Comer SAUNDERS, MD;  Location: [facility];  Service: Gynecology;  Laterality: N/A;   MALONEY DILATION N/A 02/15/2015   Procedure: AGAPITO DILATION;  Surgeon: Lamar CHRISTELLA Hollingshead, MD;  Location: [facility];  Service: Endoscopy;  Laterality: N/A;   POLYPECTOMY  02/05/2021   Procedure: POLYPECTOMY;  Surgeon: Hollingshead Lamar CHRISTELLA, MD;  Location: [facility];  Service: Endoscopy;;   ROBOTIC ASSISTED TOTAL HYSTERECTOMY WITH BILATERAL SALPINGO OOPHERECTOMY Bilateral 04/21/2023   Procedure: XI ROBOTIC ASSISTED TOTAL HYSTERECTOMY WITH BILATERAL SALPINGO OOPHORECTOMY; OMENTECTOMY;  Surgeon: Viktoria Comer SAUNDERS, MD;  Location: [facility];  Service: Gynecology;  Laterality: Bilateral;   SENTINEL NODE BIOPSY N/A 04/21/2023   Procedure: SENTINEL NODE BIOPSY;  Surgeon: Viktoria Comer SAUNDERS, MD;  Location: [facility];  Service: Gynecology;  Laterality: N/A;   TUBAL LIGATION      Family History  Problem Relation Age of Onset   CVA Father    Hypertension Father    Breast cancer Maternal Aunt    Breast cancer Paternal Aunt    Cervical cancer Other    Colon cancer Neg Hx    Ovarian cancer Neg Hx    Endometrial cancer Neg Hx    Pancreatic cancer Neg Hx    Prostate cancer Neg Hx     Social History   Socioeconomic History   Marital status: Married    Spouse name: [name]   Number of children: 2   Years of education: GED   Highest education level: Not on file  Occupational History   Occupation: Public relations account executive  Tobacco Use   Smoking status: Every Day    Current packs/day: 0.50    Average packs/day: 0.5 packs/day for 30.0 years (15.0 ttl pk-yrs)    Types: Cigarettes   Smokeless tobacco: Never  Vaping Use   Vaping status: Some Days  Substance and Sexual Activity   Alcohol use: No    Alcohol/week: 0.0 standard drinks of  alcohol   Drug use: No   Sexual activity: Not on file  Other Topics Concern   Not on file  Social History Narrative   Lives w/ husband   Caffeine use: none   Right handed   Social Drivers of Health   Financial Resource Strain: Low Risk  (05/05/2023)   Received from [facility]   Overall Financial Resource Strain (CARDIA)    Difficulty of Paying Living Expenses: Not hard at all  Food Insecurity: No Food Insecurity (07/05/2023)   Hunger Vital Sign    Worried About Running Out of Food in the Last Year: Never true    Ran Out of Food in the Last Year: Never true  Transportation Needs: No Transportation Needs (07/05/2023)   PRAPARE - Administrator, Civil Service (Medical): No    Lack of Transportation (Non-Medical): No  Physical Activity: Inactive (05/05/2023)   Received from [facility]   Exercise Vital Sign    On average, how many days per week  do you engage in moderate to strenuous exercise (like a brisk walk)?: 0 days    On average, how many minutes do you engage in exercise at this level?: 0 min  Stress: Stress Concern Present (05/05/2023)   Received from [facility] - Occupational Stress Questionnaire    Feeling of Stress : To some extent  Social Connections: Moderately Integrated (05/05/2023)   Received from [facility]   Social Connection and Isolation Panel    In a typical week, how many times do you talk on the phone with family, friends, or neighbors?: More than three times a week    How often do you get together with friends or relatives?: More than three times a week    How often do you attend church or religious services?: More than 4 times per year    Do you belong to any clubs or organizations such as church groups, unions, fraternal or athletic groups, or school groups?: No    How often do you attend meetings of the clubs or organizations you belong to?: Never    Are you married, widowed, divorced,  separated, never married, or living with a partner?: Married    Current Medications:  Current Outpatient Medications:    Cholecalciferol  (VITAMIN D ) 50 MCG (2000 UT) tablet, Take 2,000-4,000 Units by mouth See admin instructions. Take 2000 units by mouth every other day, alternating with 4000 units on alternate days, Disp: , Rfl:    Dimethyl Fumarate  240 MG CPDR, Take 1 capsule (240 mg total) by mouth 2 (two) times daily., Disp: 180 capsule, Rfl: 1   rosuvastatin  (CRESTOR ) 10 MG tablet, Take 1 tablet (10 mg total) by mouth daily., Disp: 90 tablet, Rfl: 1   valsartan -hydrochlorothiazide  (DIOVAN -HCT) 80-12.5 MG tablet, Take 1 tablet by mouth daily., Disp: 90 tablet, Rfl: 0  Review of Systems: Denies appetite changes, fevers, chills, fatigue, unexplained weight changes. Denies hearing loss, neck lumps or masses, mouth sores, ringing in ears or voice changes. Denies cough or wheezing.  Denies shortness of breath. Denies chest pain or palpitations. Denies leg swelling. Denies abdominal distention, pain, blood in stools, constipation, diarrhea, nausea, vomiting, or early satiety. Denies pain with intercourse, dysuria, frequency, hematuria or incontinence. Denies hot flashes, pelvic pain, vaginal bleeding or vaginal discharge.   Denies joint pain, back pain or muscle pain/cramps. Denies itching, rash, or wounds. Denies dizziness, headaches, numbness or seizures. Denies swollen lymph nodes or glands, denies easy bruising or bleeding. Denies anxiety, depression, confusion, or decreased concentration.  Physical Exam: BP (!) 130/55 (BP Location: Left Arm, Patient Position: Sitting)   Pulse 93   Temp 98.4 F (36.9 C) (Oral)   Resp 20   Wt 212 lb (96.2 kg)   SpO2 94%   BMI 34.22 kg/m  General: Alert, oriented, no acute distress. HEENT: Posterior oropharynx clear, sclera anicteric. Chest: Clear to auscultation bilaterally.  No wheezes or rhonchi. Cardiovascular: Regular rate and rhythm, no  murmurs. Abdomen: Obese, soft, nontender.  Normoactive bowel sounds.  No masses or hepatosplenomegaly appreciated.  Well-healed incisions. Extremities: Grossly normal range of motion.  Warm, well perfused.  No edema bilaterally. Skin: No rashes or lesions noted. Lymphatics: No cervical, supraclavicular, or inguinal adenopathy. GU: Normal appearing external genitalia without erythema, excoriation, or lesions.  Speculum exam reveals cuff intact, mild radiation changes noted at the apex.  No lesions.  Bimanual exam reveals cuff intact, no nodularity or masses.  Rectovaginal exam confirms these findings.  Laboratory & Radiologic Studies: None new  Assessment & Plan: Joan [name] is a 68 y.o. woman with at least stage IIA poorly differentiated squamous cell carcinoma of the cervix who presents for follow-up after completing adjuvant therapy. Completed adj cis/RT, brachytherapy in 07/2023.  Doing very well, NED on exam.  Regular vaginal dilator use encouraged.  Plan for post-op treatment imaging. PET ordered today.  Per NCCN surveillance recommendations, discussed plan for visits every 3 months alternating between my office and Dr. Shannon. We will continue this until 2-3 years after treatment at which time we will transition to visits every 6 months. Plan for yearly cotesting (due 07/2024).   We reviewed signs and symptoms that would prompt a phone call before her next scheduled visit.   Plan for repeat MRI to look at pancreatic lesion in 1 year (08/2024).  Discussed weight gain.  Encouraged patient to keep a food diary for at least a 2 to 3-week period to see where her calories are coming from.  Gave her the name of 2 apps on her smart phone that she can use to do this.  Also encouraged her to speak with her primary care provider at her next visit regarding weight loss medications.  22 minutes of total time was spent for this patient encounter, including preparation, face-to-face counseling  with the patient and coordination of care, and documentation of the encounter.  Comer Dollar, MD  Division of Gynecologic Oncology  Department of Obstetrics and Gynecology  [facility]

## 2023-12-02 ENCOUNTER — Ambulatory Visit (HOSPITAL_COMMUNITY)
Admission: RE | Admit: 2023-12-02 | Discharge: 2023-12-02 | Disposition: A | Discharge status: Home / Self Care | Source: Ambulatory Visit | Attending: Gynecologic Oncology | Admitting: Gynecologic Oncology

## 2023-12-02 DIAGNOSIS — C539 Malignant neoplasm of cervix uteri, unspecified: Secondary | ICD-10-CM | POA: Diagnosis not present

## 2023-12-02 DIAGNOSIS — Z9221 Personal history of antineoplastic chemotherapy: Secondary | ICD-10-CM | POA: Diagnosis not present

## 2023-12-02 DIAGNOSIS — Z923 Personal history of irradiation: Secondary | ICD-10-CM | POA: Diagnosis not present

## 2023-12-02 DIAGNOSIS — I700 Atherosclerosis of aorta: Secondary | ICD-10-CM | POA: Diagnosis not present

## 2023-12-02 MED ORDER — FLUDEOXYGLUCOSE F - 18 (FDG) INJECTION
11.8700 | Freq: Once | INTRAVENOUS | Status: AC | PRN
  Administered 2023-12-02: 11.87 via INTRAVENOUS

## 2023-12-07 ENCOUNTER — Ambulatory Visit: Payer: Self-pay | Admitting: Gynecologic Oncology

## 2023-12-09 ENCOUNTER — Telehealth: Payer: Self-pay | Admitting: Gynecologic Oncology

## 2023-12-09 NOTE — Telephone Encounter (Signed)
 Called patient and discussed recent PET scan results.  No evidence of metastatic disease.  Discussed what is likely an insufficiency fracture related to her radiation.  Patient was having some lower back pain several weeks ago, this has improved with exercises that she has been doing.  She currently takes vitamin D .  Encouraged her to also take calcium  daily.  Comer Dollar MD Gynecologic Oncology

## 2023-12-10 DIAGNOSIS — R7303 Prediabetes: Secondary | ICD-10-CM | POA: Diagnosis not present

## 2023-12-10 DIAGNOSIS — E782 Mixed hyperlipidemia: Secondary | ICD-10-CM | POA: Diagnosis not present

## 2023-12-10 DIAGNOSIS — E559 Vitamin D deficiency, unspecified: Secondary | ICD-10-CM | POA: Diagnosis not present

## 2023-12-11 LAB — LAB REPORT - SCANNED
A1c: 5.4
Albumin, Urine POC: 15.7
Creatinine, POC: 45.9 mg/dL
EGFR: 95
Microalb Creat Ratio: 34

## 2023-12-20 DIAGNOSIS — C539 Malignant neoplasm of cervix uteri, unspecified: Secondary | ICD-10-CM | POA: Diagnosis not present

## 2023-12-20 DIAGNOSIS — D531 Other megaloblastic anemias, not elsewhere classified: Secondary | ICD-10-CM | POA: Diagnosis not present

## 2023-12-20 DIAGNOSIS — C541 Malignant neoplasm of endometrium: Secondary | ICD-10-CM | POA: Diagnosis not present

## 2023-12-20 DIAGNOSIS — R809 Proteinuria, unspecified: Secondary | ICD-10-CM | POA: Diagnosis not present

## 2023-12-20 DIAGNOSIS — M8580 Other specified disorders of bone density and structure, unspecified site: Secondary | ICD-10-CM | POA: Diagnosis not present

## 2023-12-20 DIAGNOSIS — G35 Multiple sclerosis: Secondary | ICD-10-CM | POA: Diagnosis not present

## 2023-12-20 DIAGNOSIS — R7303 Prediabetes: Secondary | ICD-10-CM | POA: Diagnosis not present

## 2023-12-20 DIAGNOSIS — F411 Generalized anxiety disorder: Secondary | ICD-10-CM | POA: Diagnosis not present

## 2023-12-20 DIAGNOSIS — E782 Mixed hyperlipidemia: Secondary | ICD-10-CM | POA: Diagnosis not present

## 2023-12-20 DIAGNOSIS — E559 Vitamin D deficiency, unspecified: Secondary | ICD-10-CM | POA: Diagnosis not present

## 2023-12-20 DIAGNOSIS — K862 Cyst of pancreas: Secondary | ICD-10-CM | POA: Diagnosis not present

## 2023-12-20 DIAGNOSIS — I10 Essential (primary) hypertension: Secondary | ICD-10-CM | POA: Diagnosis not present

## 2024-01-12 DIAGNOSIS — C539 Malignant neoplasm of cervix uteri, unspecified: Secondary | ICD-10-CM | POA: Diagnosis not present

## 2024-01-12 DIAGNOSIS — Z95828 Presence of other vascular implants and grafts: Secondary | ICD-10-CM | POA: Diagnosis not present

## 2024-01-13 ENCOUNTER — Encounter (INDEPENDENT_AMBULATORY_CARE_PROVIDER_SITE_OTHER): Payer: Self-pay | Admitting: *Deleted

## 2024-01-17 DIAGNOSIS — E669 Obesity, unspecified: Secondary | ICD-10-CM | POA: Diagnosis not present

## 2024-01-17 DIAGNOSIS — Z79899 Other long term (current) drug therapy: Secondary | ICD-10-CM | POA: Diagnosis not present

## 2024-01-17 DIAGNOSIS — Z7182 Exercise counseling: Secondary | ICD-10-CM | POA: Diagnosis not present

## 2024-01-17 DIAGNOSIS — I10 Essential (primary) hypertension: Secondary | ICD-10-CM | POA: Diagnosis not present

## 2024-01-17 DIAGNOSIS — M545 Low back pain, unspecified: Secondary | ICD-10-CM | POA: Diagnosis not present

## 2024-01-17 DIAGNOSIS — Z6834 Body mass index (BMI) 34.0-34.9, adult: Secondary | ICD-10-CM | POA: Diagnosis not present

## 2024-01-17 DIAGNOSIS — Z713 Dietary counseling and surveillance: Secondary | ICD-10-CM | POA: Diagnosis not present

## 2024-01-17 DIAGNOSIS — F17210 Nicotine dependence, cigarettes, uncomplicated: Secondary | ICD-10-CM | POA: Diagnosis not present

## 2024-01-25 ENCOUNTER — Telehealth: Payer: Self-pay | Admitting: Neurology

## 2024-01-25 ENCOUNTER — Other Ambulatory Visit (HOSPITAL_COMMUNITY): Payer: Self-pay

## 2024-01-25 ENCOUNTER — Telehealth: Payer: Self-pay

## 2024-01-25 DIAGNOSIS — G35D Multiple sclerosis, unspecified: Secondary | ICD-10-CM

## 2024-01-25 MED ORDER — DIMETHYL FUMARATE 240 MG PO CPDR: 1.0000 | DELAYED_RELEASE_CAPSULE | Freq: Two times a day (BID) | ORAL | 1 refills | Status: AC

## 2024-01-25 NOTE — Telephone Encounter (Signed)
 Last seen 08/16/23 and next f/u 03/01/24.

## 2024-01-25 NOTE — Telephone Encounter (Signed)
 Pt is needing a refill request for her Dimethyl Fumarate  240 MG CPDR to be sent in to the [facility]

## 2024-01-25 NOTE — Telephone Encounter (Signed)
 [facility] based on last note:  1.    Continue  DMF generic.

## 2024-01-25 NOTE — Telephone Encounter (Signed)
 Who is your primary care physician: Zack Hall  Reasons for the colonoscopy: HX polyps  Have you had a colonoscopy before?  Yes 02-05-21 Dr.Rourk  Do you have family history of colon cancer? no  Previous colonoscopy with polyps removed? yes  Do you have a history colorectal cancer?   no  Are you diabetic? If yes, Type 1 or Type 2?    no  Do you have a prosthetic or mechanical heart valve? no  Do you have a pacemaker/defibrillator?   no  Have you had endocarditis/atrial fibrillation? no  Have you had joint replacement within the last 12 months?  no  Do you tend to be constipated or have to use laxatives? no  Do you have any history of drugs or alchohol?  no  Do you use supplemental oxygen?  no  Have you had a stroke or heart attack within the last 6 months? Not answered  Do you take weight loss medication?  no  For female patients: have you had a hysterectomy?  yes                                     are you post menopausal?       yes                                            do you still have your menstrual cycle? no      Do you take any blood-thinning medications such as: (aspirin, warfarin, Plavix, Aggrenox)  no  If yes we need the name, milligram, dosage and who is prescribing doctor  Current Outpatient Medications on File Prior to Visit  Medication Sig Dispense Refill   Cholecalciferol  (VITAMIN D3) 1000 units CAPS Take by mouth.     Dimethyl Fumarate  240 MG CPDR Take 1 capsule (240 mg total) by mouth 2 (two) times daily. 180 capsule 1   rosuvastatin  (CRESTOR ) 10 MG tablet Take 1 tablet (10 mg total) by mouth daily. 90 tablet 1   valsartan -hydrochlorothiazide  (DIOVAN -HCT) 80-12.5 MG tablet Take 1 tablet by mouth daily. 90 tablet 0   [DISCONTINUED] prochlorperazine  (COMPAZINE ) 10 MG tablet Take 1 tablet (10 mg total) by mouth every 6 (six) hours as needed (nausea or vomiting). 30 tablet 2   No current facility-administered medications on file prior to visit.    No  Known Allergies   Pharmacy: [facility]  Primary Insurance Name: [employer] [id number]  Best number where you can be reached: [phone]

## 2024-01-27 ENCOUNTER — Other Ambulatory Visit: Payer: Self-pay

## 2024-01-28 ENCOUNTER — Other Ambulatory Visit: Payer: Self-pay

## 2024-01-28 ENCOUNTER — Other Ambulatory Visit (HOSPITAL_COMMUNITY): Payer: Self-pay

## 2024-01-28 MED ORDER — VALSARTAN-HYDROCHLOROTHIAZIDE 80-12.5 MG PO TABS
1.0000 | ORAL_TABLET | Freq: Every day | ORAL | 0 refills | Status: DC
  Filled 2024-01-28: qty 90, 90d supply, fill #0

## 2024-01-31 ENCOUNTER — Ambulatory Visit (HOSPITAL_COMMUNITY)
Admission: RE | Admit: 2024-01-31 | Discharge: 2024-01-31 | Disposition: A | Discharge status: Home / Self Care | Source: Ambulatory Visit | Attending: Urology | Admitting: Urology

## 2024-01-31 DIAGNOSIS — N281 Cyst of kidney, acquired: Secondary | ICD-10-CM | POA: Insufficient documentation

## 2024-01-31 NOTE — Telephone Encounter (Signed)
 noted

## 2024-02-01 ENCOUNTER — Encounter: Payer: Self-pay | Admitting: Internal Medicine

## 2024-02-01 ENCOUNTER — Telehealth: Payer: Self-pay | Admitting: *Deleted

## 2024-02-01 ENCOUNTER — Ambulatory Visit (INDEPENDENT_AMBULATORY_CARE_PROVIDER_SITE_OTHER): Admitting: Internal Medicine

## 2024-02-01 VITALS — BP 122/72 | HR 76 | Temp 97.9°F | Ht 66.0 in | Wt 210.8 lb

## 2024-02-01 DIAGNOSIS — Z860101 Personal history of adenomatous and serrated colon polyps: Secondary | ICD-10-CM

## 2024-02-01 DIAGNOSIS — D369 Benign neoplasm, unspecified site: Secondary | ICD-10-CM

## 2024-02-01 NOTE — Telephone Encounter (Signed)
 LMOVM to call back to schedule TCS with Dr. Shaaron, asa 3 ok rm 1

## 2024-02-01 NOTE — Patient Instructions (Signed)
 Nice to see you again!  As discussed, we will plan for a surveillance colonoscopy (history of colon polyps) ASA 3-room 1 or 2 okay.  Agree with doing nothing more than repeating the MRI of your pancreas in May of next year.  Further recommendations to follow.

## 2024-02-01 NOTE — Progress Notes (Signed)
 Gastroenterology Progress Note    Primary Care Physician:  Shona Norleen PEDLAR, MD Primary Gastroenterologist:  Dr.   Pre-Procedure History & Physical: HPI:  Joan [name] is a 68 y.o. female here for to set up a surveillance colonoscopy.  Patient has history of large burden of colonic adenomas on last couple of colonoscopies.  She is here for surveillance.  She had multiple adenomas removed 2022.  No bowel symptoms at this time.  MS is stable.  History of multiple small cyst in the pancreas on MRI felt to be IPMN.  Less than 2 cm.  MRI in the spring 2026 recommended.  Past Medical History:  Diagnosis Date   Cancer [facility])    Endometrial   Colitis, ischemic 2003   Headaches, cluster    History of radiation therapy    Pelvis-07/05/23-08/05/23-Dr. Lynwood Nasuti   Hypercholesterolemia    Hypertension    Insomnia    Multiple sclerosis 05/29/2018   Neuromuscular disorder (HCC)    Multiple Sclerosis   PE (pulmonary embolism)    ?birth control, in 49s   PONV (postoperative nausea and vomiting)    S/P colonoscopy 7995,7992   2004: tubular adeboma, 2007: hyperplastic polyps. Due 2012   Seasonal allergies    Tobacco abuse    Tubular adenoma 2004   colonoscopy 2004    Past Surgical History:  Procedure Laterality Date   BREAST BIOPSY Left    benign   COLONOSCOPY  09/17/05   20 cm polyp removed/bx/middescending colon polyp removed with snare   COLONOSCOPY  2003   tubular adenoma removed from cecum    COLONOSCOPY  03/31/2011   RMR, diverticulosis, multiple colon polyps removed (tubular adenomas). next TCS 03/2016   COLONOSCOPY N/A 10/22/2015   Procedure: COLONOSCOPY;  Surgeon: Lamar CHRISTELLA Hollingshead, MD;  [facility];  Service: Endoscopy;  Laterality: N/A;  730   COLONOSCOPY N/A 02/05/2021   Procedure: COLONOSCOPY;  Surgeon: Hollingshead Lamar CHRISTELLA, MD;  [facility];  Service: Endoscopy;  Laterality: N/A;  ASA II / 9:30   ESOPHAGOGASTRODUODENOSCOPY N/A 02/15/2015   RMR, mild  reflux esophagitis. empirical dilation of esophagus.    LYMPH NODE DISSECTION N/A 04/21/2023   Procedure: LYMPH NODE DISSECTION;  Surgeon: Viktoria Comer SAUNDERS, MD;  Location: [facility];  Service: Gynecology;  Laterality: N/A;   MALONEY DILATION N/A 02/15/2015   Procedure: AGAPITO DILATION;  Surgeon: Lamar CHRISTELLA Hollingshead, MD;  [facility];  Service: Endoscopy;  Laterality: N/A;   POLYPECTOMY  02/05/2021   Procedure: POLYPECTOMY;  Surgeon: Hollingshead Lamar CHRISTELLA, MD;  [facility];  Service: Endoscopy;;   ROBOTIC ASSISTED TOTAL HYSTERECTOMY WITH BILATERAL SALPINGO OOPHERECTOMY Bilateral 04/21/2023   Procedure: XI ROBOTIC ASSISTED TOTAL HYSTERECTOMY WITH BILATERAL SALPINGO OOPHORECTOMY; OMENTECTOMY;  Surgeon: Viktoria Comer SAUNDERS, MD;  Location: [facility];  Service: Gynecology;  Laterality: Bilateral;   SENTINEL NODE BIOPSY N/A 04/21/2023   Procedure: SENTINEL NODE BIOPSY;  Surgeon: Viktoria Comer SAUNDERS, MD;  Location: [facility];  Service: Gynecology;  Laterality: N/A;   TUBAL LIGATION      Prior to Admission medications   Medication Sig Start Date End Date Taking? Authorizing Provider  Cholecalciferol  (VITAMIN D3) 1000 units CAPS Take by mouth.   Yes [provider]  Dimethyl Fumarate  240 MG CPDR Take 1 capsule (240 mg total) by mouth 2 (two) times daily. 01/25/24 01/24/25 Yes Sater, Charlie DELENA, MD  rosuvastatin  (CRESTOR ) 10 MG tablet Take 1 tablet (10 mg total) by mouth daily.  11/01/23  Yes   valsartan -hydrochlorothiazide  (DIOVAN -HCT) 80-12.5 MG tablet Take 1 tablet by mouth daily. 01/27/24  Yes     Allergies as of 02/01/2024   (No Known Allergies)    Family History  Problem Relation Age of Onset   CVA Father    Hypertension Father    Breast cancer Maternal Aunt    Breast cancer Paternal Aunt    Cervical cancer Other    Colon cancer Neg Hx    Ovarian cancer Neg Hx    Endometrial cancer Neg Hx    Pancreatic cancer Neg Hx    Prostate cancer Neg Hx     Social History    Socioeconomic History   Marital status: Married    Spouse name: [name]   Number of children: 2   Years of education: GED   Highest education level: Not on file  Occupational History   Occupation: Public relations account executive  Tobacco Use   Smoking status: Every Day    Current packs/day: 0.50    Average packs/day: 0.5 packs/day for 30.0 years (15.0 ttl pk-yrs)    Types: Cigarettes   Smokeless tobacco: Never  Vaping Use   Vaping status: Some Days  Substance and Sexual Activity   Alcohol use: No    Alcohol/week: 0.0 standard drinks of alcohol   Drug use: No   Sexual activity: Not on file  Other Topics Concern   Not on file  Social History Narrative   Lives w/ husband   Caffeine use: none   Right handed   Social Drivers of Health   Financial Resource Strain: Low Risk  (05/05/2023)   Received from [facility]   Overall Financial Resource Strain (CARDIA)    Difficulty of Paying Living Expenses: Not hard at all  Food Insecurity: No Food Insecurity (07/05/2023)   Hunger Vital Sign    Worried About Running Out of Food in the Last Year: Never true    Ran Out of Food in the Last Year: Never true  Transportation Needs: No Transportation Needs (07/05/2023)   PRAPARE - Administrator, Civil Service (Medical): No    Lack of Transportation (Non-Medical): No  Physical Activity: Inactive (05/05/2023)   Received from [facility]   Exercise Vital Sign    On average, how many days per week do you engage in moderate to strenuous exercise (like a brisk walk)?: 0 days    On average, how many minutes do you engage in exercise at this level?: 0 min  Stress: Stress Concern Present (05/05/2023)   Received from [facility] of Occupational Health - Occupational Stress Questionnaire    Feeling of Stress : To some extent  Social Connections: Moderately Integrated (05/05/2023)   Received from [facility]   Social Connection and Isolation Panel    In a typical week,  how many times do you talk on the phone with family, friends, or neighbors?: More than three times a week    How often do you get together with friends or relatives?: More than three times a week    How often do you attend church or religious services?: More than 4 times per year    Do you belong to any clubs or organizations such as church groups, unions, fraternal or athletic groups, or school groups?: No    How often do you attend meetings of the clubs or organizations you belong to?: Never    Are you married, widowed, divorced, separated,  never married, or living with a partner?: Married  Intimate Partner Violence: Not At Risk (08/25/2023)   Received from [facility]   Humiliation, Afraid, Rape, and Kick questionnaire    Within the last year, have you been afraid of your partner or ex-partner?: No    Within the last year, have you been humiliated or emotionally abused in other ways by your partner or ex-partner?: No    Within the last year, have you been kicked, hit, slapped, or otherwise physically hurt by your partner or ex-partner?: No    Within the last year, have you been raped or forced to have any kind of sexual activity by your partner or ex-partner?: No    Review of Systems   See HPI, otherwise negative ROS  Physical Exam: BP 122/72 (BP Location: Right Arm, Patient Position: Sitting, Cuff Size: Large)   Pulse 76   Temp 97.9 F (36.6 C) (Oral)   Ht 5' 6 (1.676 m)   Wt 210 lb 12.8 oz (95.6 kg)   SpO2 97%   BMI 34.02 kg/m  General:   Alert,  Well-developed, well-nourished, pleasant and cooperative in NADLungs:  Clear throughout to auscultation.   No wheezes, crackles, or rhonchi. No acute distress. Heart:  Regular rate and rhythm; no murmurs, clicks, rubs,  or gallops. Abdomen: Non-distended, normal bowel sounds.  Soft and nontender without appreciable mass or hepatosplenomegaly.   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  History of multiple colonic adenomas removed over time.  She is due for surveillance colonoscopy at this time. As a separate issue she has small cyst in her pancreas likely benign IPMNs, agree with plans for nothing more than a surveillance MRI in 1 year. I have offered the patient a surveillance colonoscopy.  ASA 3 (room 1 or 2 okay) The risks, benefits, limitations, alternatives and imponderables have been reviewed with the patient. Questions have been answered. All parties are agreeable.     Notice: This dictation was prepared with Dragon dictation along with smaller phrase technology. Any transcriptional errors that result from this process are unintentional and may not be corrected upon review.

## 2024-02-01 NOTE — H&P (View-Only) (Signed)
 Gastroenterology Progress Note    Primary Care Physician:  Shona Norleen PEDLAR, MD Primary Gastroenterologist:  Dr.   Pre-Procedure History & Physical: HPI:  Joan [name] is a 68 y.o. female here for to set up a surveillance colonoscopy.  Patient has history of large burden of colonic adenomas on last couple of colonoscopies.  She is here for surveillance.  She had multiple adenomas removed 2022.  No bowel symptoms at this time.  MS is stable.  History of multiple small cyst in the pancreas on MRI felt to be IPMN.  Less than 2 cm.  MRI in the spring 2026 recommended.  Past Medical History:  Diagnosis Date   Cancer [facility])    Endometrial   Colitis, ischemic 2003   Headaches, cluster    History of radiation therapy    Pelvis-07/05/23-08/05/23-Dr. Lynwood Nasuti   Hypercholesterolemia    Hypertension    Insomnia    Multiple sclerosis 05/29/2018   Neuromuscular disorder (HCC)    Multiple Sclerosis   PE (pulmonary embolism)    ?birth control, in 49s   PONV (postoperative nausea and vomiting)    S/P colonoscopy [id number]   2004: tubular adeboma, 2007: hyperplastic polyps. Due 2012   Seasonal allergies    Tobacco abuse    Tubular adenoma 2004   colonoscopy 2004    Past Surgical History:  Procedure Laterality Date   BREAST BIOPSY Left    benign   COLONOSCOPY  09/17/05   20 cm polyp removed/bx/middescending colon polyp removed with snare   COLONOSCOPY  2003   tubular adenoma removed from cecum    COLONOSCOPY  03/31/2011   RMR, diverticulosis, multiple colon polyps removed (tubular adenomas). next TCS 03/2016   COLONOSCOPY N/A 10/22/2015   Procedure: COLONOSCOPY;  Surgeon: Lamar CHRISTELLA Hollingshead, MD;  [facility];  Service: Endoscopy;  Laterality: N/A;  730   COLONOSCOPY N/A 02/05/2021   Procedure: COLONOSCOPY;  Surgeon: Hollingshead Lamar CHRISTELLA, MD;  [facility];  Service: Endoscopy;  Laterality: N/A;  ASA II / 9:30   ESOPHAGOGASTRODUODENOSCOPY N/A 02/15/2015   RMR, mild  reflux esophagitis. empirical dilation of esophagus.    LYMPH NODE DISSECTION N/A 04/21/2023   Procedure: LYMPH NODE DISSECTION;  Surgeon: Viktoria Comer SAUNDERS, MD;  Location: [facility];  Service: Gynecology;  Laterality: N/A;   MALONEY DILATION N/A 02/15/2015   Procedure: AGAPITO DILATION;  Surgeon: Lamar CHRISTELLA Hollingshead, MD;  [facility];  Service: Endoscopy;  Laterality: N/A;   POLYPECTOMY  02/05/2021   Procedure: POLYPECTOMY;  Surgeon: Hollingshead Lamar CHRISTELLA, MD;  [facility];  Service: Endoscopy;;   ROBOTIC ASSISTED TOTAL HYSTERECTOMY WITH BILATERAL SALPINGO OOPHERECTOMY Bilateral 04/21/2023   Procedure: XI ROBOTIC ASSISTED TOTAL HYSTERECTOMY WITH BILATERAL SALPINGO OOPHORECTOMY; OMENTECTOMY;  Surgeon: Viktoria Comer SAUNDERS, MD;  Location: [facility];  Service: Gynecology;  Laterality: Bilateral;   SENTINEL NODE BIOPSY N/A 04/21/2023   Procedure: SENTINEL NODE BIOPSY;  Surgeon: Viktoria Comer SAUNDERS, MD;  Location: [facility];  Service: Gynecology;  Laterality: N/A;   TUBAL LIGATION      Prior to Admission medications   Medication Sig Start Date End Date Taking? Authorizing Provider  Cholecalciferol  (VITAMIN D3) 1000 units CAPS Take by mouth.   Yes [provider]  Dimethyl Fumarate  240 MG CPDR Take 1 capsule (240 mg total) by mouth 2 (two) times daily. 01/25/24 01/24/25 Yes Sater, Charlie DELENA, MD  rosuvastatin  (CRESTOR ) 10 MG tablet Take 1 tablet (10 mg total) by mouth daily.  11/01/23  Yes   valsartan -hydrochlorothiazide  (DIOVAN -HCT) 80-12.5 MG tablet Take 1 tablet by mouth daily. 01/27/24  Yes     Allergies as of 02/01/2024   (No Known Allergies)    Family History  Problem Relation Age of Onset   CVA Father    Hypertension Father    Breast cancer Maternal Aunt    Breast cancer Paternal Aunt    Cervical cancer Other    Colon cancer Neg Hx    Ovarian cancer Neg Hx    Endometrial cancer Neg Hx    Pancreatic cancer Neg Hx    Prostate cancer Neg Hx     Social History    Socioeconomic History   Marital status: Married    Spouse name: [name]   Number of children: 2   Years of education: GED   Highest education level: Not on file  Occupational History   Occupation: Public relations account executive  Tobacco Use   Smoking status: Every Day    Current packs/day: 0.50    Average packs/day: 0.5 packs/day for 30.0 years (15.0 ttl pk-yrs)    Types: Cigarettes   Smokeless tobacco: Never  Vaping Use   Vaping status: Some Days  Substance and Sexual Activity   Alcohol use: No    Alcohol/week: 0.0 standard drinks of alcohol   Drug use: No   Sexual activity: Not on file  Other Topics Concern   Not on file  Social History Narrative   Lives w/ husband   Caffeine use: none   Right handed   Social Drivers of Health   Financial Resource Strain: Low Risk  (05/05/2023)   Received from [facility]   Overall Financial Resource Strain (CARDIA)    Difficulty of Paying Living Expenses: Not hard at all  Food Insecurity: No Food Insecurity (07/05/2023)   Hunger Vital Sign    Worried About Running Out of Food in the Last Year: Never true    Ran Out of Food in the Last Year: Never true  Transportation Needs: No Transportation Needs (07/05/2023)   PRAPARE - Administrator, Civil Service (Medical): No    Lack of Transportation (Non-Medical): No  Physical Activity: Inactive (05/05/2023)   Received from [facility]   Exercise Vital Sign    On average, how many days per week do you engage in moderate to strenuous exercise (like a brisk walk)?: 0 days    On average, how many minutes do you engage in exercise at this level?: 0 min  Stress: Stress Concern Present (05/05/2023)   Received from [facility] of Occupational Health - Occupational Stress Questionnaire    Feeling of Stress : To some extent  Social Connections: Moderately Integrated (05/05/2023)   Received from [facility]   Social Connection and Isolation Panel    In a typical week,  how many times do you talk on the phone with family, friends, or neighbors?: More than three times a week    How often do you get together with friends or relatives?: More than three times a week    How often do you attend church or religious services?: More than 4 times per year    Do you belong to any clubs or organizations such as church groups, unions, fraternal or athletic groups, or school groups?: No    How often do you attend meetings of the clubs or organizations you belong to?: Never    Are you married, widowed, divorced, separated,  never married, or living with a partner?: Married  Intimate Partner Violence: Not At Risk (08/25/2023)   Received from [facility]   Humiliation, Afraid, Rape, and Kick questionnaire    Within the last year, have you been afraid of your partner or ex-partner?: No    Within the last year, have you been humiliated or emotionally abused in other ways by your partner or ex-partner?: No    Within the last year, have you been kicked, hit, slapped, or otherwise physically hurt by your partner or ex-partner?: No    Within the last year, have you been raped or forced to have any kind of sexual activity by your partner or ex-partner?: No    Review of Systems   See HPI, otherwise negative ROS  Physical Exam: BP 122/72 (BP Location: Right Arm, Patient Position: Sitting, Cuff Size: Large)   Pulse 76   Temp 97.9 F (36.6 C) (Oral)   Ht 5' 6 (1.676 m)   Wt 210 lb 12.8 oz (95.6 kg)   SpO2 97%   BMI 34.02 kg/m  General:   Alert,  Well-developed, well-nourished, pleasant and cooperative in NADLungs:  Clear throughout to auscultation.   No wheezes, crackles, or rhonchi. No acute distress. Heart:  Regular rate and rhythm; no murmurs, clicks, rubs,  or gallops. Abdomen: Non-distended, normal bowel sounds.  Soft and nontender without appreciable mass or hepatosplenomegaly.   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  History of multiple colonic adenomas removed over time.  She is due for surveillance colonoscopy at this time. As a separate issue she has small cyst in her pancreas likely benign IPMNs, agree with plans for nothing more than a surveillance MRI in 1 year. I have offered the patient a surveillance colonoscopy.  ASA 3 (room 1 or 2 okay) The risks, benefits, limitations, alternatives and imponderables have been reviewed with the patient. Questions have been answered. All parties are agreeable.     Notice: This dictation was prepared with Dragon dictation along with smaller phrase technology. Any transcriptional errors that result from this process are unintentional and may not be corrected upon review.

## 2024-02-02 ENCOUNTER — Encounter: Payer: Self-pay | Admitting: *Deleted

## 2024-02-02 ENCOUNTER — Other Ambulatory Visit: Payer: Self-pay | Admitting: *Deleted

## 2024-02-02 MED ORDER — NA SULFATE-K SULFATE-MG SULF 17.5-3.13-1.6 GM/177ML PO SOLN: ORAL | 0 refills | Status: AC

## 2024-02-02 NOTE — Telephone Encounter (Signed)
 Pt has been scheduled for 02/18/24. Instructions sent via mychart and prep sent to pharmacy.

## 2024-02-08 ENCOUNTER — Ambulatory Visit: Payer: Self-pay | Admitting: Urology

## 2024-02-11 ENCOUNTER — Ambulatory Visit: Admitting: Urology

## 2024-02-11 ENCOUNTER — Encounter: Payer: Self-pay | Admitting: Urology

## 2024-02-11 VITALS — BP 110/72 | HR 80

## 2024-02-11 DIAGNOSIS — R310 Gross hematuria: Secondary | ICD-10-CM

## 2024-02-11 DIAGNOSIS — N281 Cyst of kidney, acquired: Secondary | ICD-10-CM | POA: Diagnosis not present

## 2024-02-11 LAB — URINALYSIS, ROUTINE W REFLEX MICROSCOPIC
Glucose, UA: NEGATIVE
Nitrite, UA: NEGATIVE
RBC, UA: NEGATIVE
Specific Gravity, UA: 1.025 (ref 1.005–1.030)
Urobilinogen, Ur: 1 mg/dL (ref 0.2–1.0)
pH, UA: 6 (ref 5.0–7.5)

## 2024-02-11 LAB — MICROSCOPIC EXAMINATION

## 2024-02-11 NOTE — Patient Instructions (Signed)
 Hydronephrosis  Hydronephrosis is the swelling of one or both kidneys due to a blockage that stops urine from flowing out of the body. Kidneys filter waste from the blood and produce urine. This condition can lead to kidney failure and may become life-threatening if not treated promptly. What are the causes? In infants and children, common causes include problems that occur when a baby is developing in the womb. These can include problems in the kidneys or in the tubes that drain urine into the bladder (ureters). In adults, common causes include: Kidney stones. Pregnancy. A tumor or cyst in the abdomen or pelvis. An enlarged prostate gland. Other causes include: Bladder infection. Scar tissue from a previous surgery or injury. A blood clot. Cancer of the prostate, bladder, uterus, ovary, or colon. What are the signs or symptoms? Symptoms of this condition include: Pain or discomfort in your side (flank) or abdomen. Swelling in your abdomen. Nausea and vomiting. Fever. Pain when passing urine. Feelings of urgency when you need to urinate. Urinating more often than normal. In some cases, you may not have any symptoms. How is this diagnosed? This condition may be diagnosed based on: Your symptoms and medical history. A physical exam. Blood and urine tests. Imaging tests, such as an ultrasound, CT scan, or MRI. A procedure to look at your urinary tract and bladder by inserting a scope into the urethra (cystoscopy). How is this treated? Treatment for this condition depends on where the blockage is, how long it has been there, and what caused it. The goal of treatment is to remove the blockage. Treatment may include: Antibiotic medicines to treat or prevent infection. A procedure to place a small, thin tube (stent) into a blocked ureter. The stent will keep the ureter open so that urine can drain through it. A nonsurgical procedure that crushes kidney stones with shock waves  (extracorporeal shock wave lithotripsy). If kidney failure occurs, treatment may include dialysis or a kidney transplant. Follow these instructions at home:  Take over-the-counter and prescription medicines only as told by your health care provider. If you were prescribed an antibiotic medicine, take it exactly as told by your health care provider. Do not stop taking the antibiotic even if you start to feel better. Rest and return to your normal activities as told by your health care provider. Ask your health care provider what activities are safe for you. Drink enough fluid to keep your urine pale yellow. Keep all follow-up visits. This is important. Contact a health care provider if: You continue to have symptoms after treatment. You develop new symptoms. Your urine becomes cloudy or bloody. You have a fever. Get help right away if: You have severe flank or abdominal pain. You cannot drink fluids without vomiting. Summary Hydronephrosis is the swelling of one or both kidneys due to a blockage that stops urine from flowing out of the body. Hydronephrosis can lead to kidney failure and may become life-threatening if not treated promptly. The goal of treatment is to remove the blockage. It may include a procedure to insert a stent into a blocked ureter, a procedure to break up kidney stones, or taking antibiotic medicines. Follow your health care provider's instructions for taking care of yourself at home, including instructions about drinking fluids, taking medicines, and limiting activities. This information is not intended to replace advice given to you by your health care provider. Make sure you discuss any questions you have with your health care provider. Document Revised: 12/29/2022 Document Reviewed: 12/29/2022 Elsevier  Patient Education  2024 [employer].

## 2024-02-11 NOTE — Progress Notes (Signed)
 02/11/2024 9:19 AM   Joan [name] Anon 11/19/1955 [id number]  Referring provider: Shona Norleen PEDLAR, MD [address] [place] Joan [name],  [place] [address]  Followup gross hematuria   HPI: [name] is a 35bn here for followup for gross hematuria. No UTIs since last visit. She denies dysuria or hematuria. No significant LUTS. Renal US  shows right renal cysts which are stable. No hydronephrosis.    PMH: Past Medical History:  Diagnosis Date   Cancer [facility])    Endometrial   Colitis, ischemic 2003   Headaches, cluster    History of radiation therapy    Pelvis-07/05/23-08/05/23-Dr. Lynwood Nasuti   Hypercholesterolemia    Hypertension    Insomnia    Multiple sclerosis 05/29/2018   Neuromuscular disorder (HCC)    Multiple Sclerosis   PE (pulmonary embolism)    ?birth control, in 13s   PONV (postoperative nausea and vomiting)    S/P colonoscopy [id number]   2004: tubular adeboma, 2007: hyperplastic polyps. Due 2012   Seasonal allergies    Tobacco abuse    Tubular adenoma 2004   colonoscopy 2004    Surgical History: Past Surgical History:  Procedure Laterality Date   BREAST BIOPSY Left    benign   COLONOSCOPY  09/17/05   20 cm polyp removed/bx/middescending colon polyp removed with snare   COLONOSCOPY  2003   tubular adenoma removed from cecum    COLONOSCOPY  03/31/2011   RMR, diverticulosis, multiple colon polyps removed (tubular adenomas). next TCS 03/2016   COLONOSCOPY N/A 10/22/2015   Procedure: COLONOSCOPY;  Surgeon: Lamar CHRISTELLA Hollingshead, MD;  Location: [facility];  Service: Endoscopy;  Laterality: N/A;  730   COLONOSCOPY N/A 02/05/2021   Procedure: COLONOSCOPY;  Surgeon: Hollingshead Lamar CHRISTELLA, MD;  Location: [facility];  Service: Endoscopy;  Laterality: N/A;  ASA II / 9:30   ESOPHAGOGASTRODUODENOSCOPY N/A 02/15/2015   RMR, mild reflux esophagitis. empirical dilation of esophagus.    LYMPH NODE DISSECTION N/A 04/21/2023   Procedure: LYMPH NODE DISSECTION;  Surgeon: Viktoria Comer SAUNDERS, MD;  Location: [facility];  Service: Gynecology;  Laterality: N/A;   MALONEY DILATION N/A 02/15/2015   Procedure: AGAPITO DILATION;  Surgeon: Lamar CHRISTELLA Hollingshead, MD;  Location: [facility];  Service: Endoscopy;  Laterality: N/A;   POLYPECTOMY  02/05/2021   Procedure: POLYPECTOMY;  Surgeon: Hollingshead Lamar CHRISTELLA, MD;  Location: [facility];  Service: Endoscopy;;   ROBOTIC ASSISTED TOTAL HYSTERECTOMY WITH BILATERAL SALPINGO OOPHERECTOMY Bilateral 04/21/2023   Procedure: XI ROBOTIC ASSISTED TOTAL HYSTERECTOMY WITH BILATERAL SALPINGO OOPHORECTOMY; OMENTECTOMY;  Surgeon: Viktoria Comer SAUNDERS, MD;  Location: [facility];  Service: Gynecology;  Laterality: Bilateral;   SENTINEL NODE BIOPSY N/A 04/21/2023   Procedure: SENTINEL NODE BIOPSY;  Surgeon: Viktoria Comer SAUNDERS, MD;  Location: [facility];  Service: Gynecology;  Laterality: N/A;   TUBAL LIGATION      Home Medications:  Allergies as of 02/11/2024   No Known Allergies      Medication List        Accurate as of February 11, 2024  9:19 AM. If you have any questions, ask your nurse or doctor.          Dimethyl Fumarate  240 MG Cpdr Take 1 capsule (240 mg total) by mouth 2 (two) times daily.   Na Sulfate-K Sulfate-Mg Sulfate concentrate 17.5-3.13-1.6 GM/177ML Soln Commonly known as: SUPREP As directed   rosuvastatin  10 MG tablet Commonly known as: CRESTOR  Take 1 tablet (10 mg total) by mouth daily.  valsartan -hydrochlorothiazide  80-12.5 MG tablet Commonly known as: DIOVAN -HCT Take 1 tablet by mouth daily.   Vitamin D3 1000 units Caps Take by mouth.        Allergies: No Known Allergies  Family History: Family History  Problem Relation Age of Onset   CVA Father    Hypertension Father    Breast cancer Maternal Aunt    Breast cancer Paternal Aunt    Cervical cancer Other    Colon cancer Neg Hx    Ovarian cancer Neg Hx    Endometrial cancer Neg Hx    Pancreatic cancer Neg Hx    Prostate cancer Neg Hx     Social History:   reports that she has been smoking cigarettes. She has a 15 pack-year smoking history. She has never used smokeless tobacco. She reports that she does not drink alcohol and does not use drugs.  ROS: All other review of systems were reviewed and are negative except what is noted above in HPI  Physical Exam: BP 110/72   Constitutional:  Alert and oriented, No acute distress. HEENT: [place] AT, moist mucus membranes.  Trachea midline, no masses. Cardiovascular: No clubbing, cyanosis, or edema. Respiratory: Normal respiratory effort, no increased work of breathing. GI: Abdomen is soft, nontender, nondistended, no abdominal masses GU: No CVA tenderness.  Lymph: No cervical or inguinal lymphadenopathy. Skin: No rashes, bruises or suspicious lesions. Neurologic: Grossly intact, no focal deficits, moving all 4 extremities. Psychiatric: Normal mood and affect.  Laboratory Data: Lab Results  Component Value Date   WBC 2.7 (L) 07/14/2023   HGB 10.4 (L) 07/14/2023   HCT 29.2 (L) 07/14/2023   MCV 97.3 07/14/2023   PLT 190 07/14/2023    Lab Results  Component Value Date   CREATININE 0.93 07/22/2023    No results found for: PSA  No results found for: TESTOSTERONE  No results found for: HGBA1C  Urinalysis    Component Value Date/Time   COLORURINE YELLOW 07/14/2023 0843   APPEARANCEUR Clear 08/11/2023 0915   LABSPEC 1.015 07/14/2023 0843   PHURINE 6.0 07/14/2023 0843   GLUCOSEU Negative 08/11/2023 0915   HGBUR LARGE (A) 07/14/2023 0843   BILIRUBINUR Negative 08/11/2023 0915   KETONESUR NEGATIVE 07/14/2023 0843   PROTEINUR Negative 08/11/2023 0915   PROTEINUR >=300 (A) 07/14/2023 0843   UROBILINOGEN 1.0 09/19/2008 0806   NITRITE Negative 08/11/2023 0915   NITRITE NEGATIVE 07/14/2023 0843   LEUKOCYTESUR Negative 08/11/2023 0915   LEUKOCYTESUR SMALL (A) 07/14/2023 0843    Lab Results  Component Value Date   LABMICR Comment 08/11/2023   WBCUA 6-10 (A) 07/21/2023   LABEPIT  0-10 07/21/2023   BACTERIA None seen 07/21/2023    Pertinent Imaging: Renal US  01/31/24: Images reviewed and discussed with the patient No results found for this or any previous visit.  No results found for this or any previous visit.  No results found for this or any previous visit.  No results found for this or any previous visit.  Results for orders placed during the hospital encounter of 01/31/24  US  RENAL  Narrative CLINICAL DATA:  Renal cysts  EXAM: RENAL / URINARY TRACT ULTRASOUND COMPLETE  COMPARISON:  None Available.  FINDINGS: Right Kidney:  Renal measurements: 11.2 x 5.2 x 5.4 cm = volume: 164 mL. Echogenicity is slightly increased. Atrophic changes of the cortex cortical cysts measuring 4.4 and 1.3 cm in upper and lower pole respectively no solid mass or hydronephrosis visualized.  Left Kidney:  Renal measurements: 11.1 x 5.4 x  5 cm = volume: 166 mL. Echogenicity is slightly increased. Atrophic changes of the cortex. Left kidney upper pole is obscured by bowel gas. No mass or hydronephrosis visualized.  Bladder:  Appears normal for degree of bladder distention.  Other:  None.  IMPRESSION: Right kidney simple cortical cysts, stable.   Electronically Signed By: Megan  Zare M.D. On: 02/01/2024 18:20  No results found for this or any previous visit.  Results for orders placed in visit on 07/21/23  CT HEMATURIA WORKUP  Narrative CLINICAL DATA:  Gross hematuria. Hysterectomy 04/21/2023. Cervical cancer.  EXAM: CT ABDOMEN AND PELVIS WITHOUT AND WITH CONTRAST  TECHNIQUE: Multidetector CT imaging of the abdomen and pelvis was performed following the standard protocol before and following the bolus administration of intravenous contrast.  RADIATION DOSE REDUCTION: This exam was performed according to the departmental dose-optimization program which includes automated exposure control, adjustment of the mA and/or kV according to patient  size and/or use of iterative reconstruction technique.  CONTRAST:  OMNIPAQUE  IOHEXOL  300 MG/ML  SOLN  COMPARISON:  CT without contrast 07/14/2023, CT with contrast 06/04/2023, PET-CT 05/24/2023, CT with contrast 03/05/2023.  FINDINGS: Lower chest: The cardiac size is normal. The tip of an infusion catheter terminates in the upper atrium. Lung bases are clear  Hepatobiliary: Liver is 22 cm length and mildly steatotic. No mass enhancement. The gallbladder bile ducts are unremarkable.  Pancreas: Partially atrophic. A cystic lesion is again noted in the upper pancreatic head measuring 1.8 x 1.5 cm and 18 Hounsfield units, stable back to MRI without and with contrast 01/02/2022 and did not show enhancement previously.  There is no solid mass enhancement and no downstream ductal dilatation.  Again, this is most likely either a side branch IPMN or a postinflammatory cyst.  Consider follow-up pancreatic dedicated MRI for recharacterization.  Rest of the pancreas is unremarkable.  Spleen: Unremarkable.  Adrenals/Urinary Tract: There is no adrenal mass. Again noted are nonenhancing Bosniak 1 cysts in the superior pole of both kidneys, 5 cm right pole and 2.5 cm pole.  There are additional occasional subcentimeter too small to characterize Bosniak 2 cortical cysts in mid and lower pole of kidneys.  No follow-up imaging is recommended. There is mass enhancement stones or hydronephrosis.  The bladder is contracted and not well filled. On the delayed images bladder is slightly filled with contrast.  No discrete wall mass is seen on the delayed images. There appears to be at least mild bladder thickening and perivesical stranding changes suspicious for cystitis.  Stomach/Bowel: No dilatation or wall thickening including the appendix.  Mild fecal stasis ascending and transverse colon. Scattered uncomplicated colonic diverticulosis.  Vascular/Lymphatic: Aortic  atherosclerosis. No enlarged abdominal or pelvic lymph nodes.  Reproductive: Status post hysterectomy. No adnexal masses.  Other: Small umbilical fat hernia. No incarcerated hernia. Small inguinal fat hernias. No free fluid, free hemorrhage or free air.  Musculoskeletal: Osteopenia and degenerative change of the spine. Chronic reactive peridiscal endplate sclerosis L5-S1.  Mild chronic wedge compression deformities T12, L1, L3.  A calcified disc extrusion chronically noted T9-10 with at least mild mass-effect suspected on the distal spinal cord. Small calcified disc extrusions at both adjacent levels.  IMPRESSION: 1. No evidence of urinary tract stones, mass or hydronephrosis. 2. Contracted bladder with at least mild bladder thickening and perivesical stranding changes suspicious for cystitis. 3. Constipation and diverticulosis. 4. Aortic atherosclerosis. 5. Stable 1.8 x 1.5 cm cystic lesion in the upper pancreatic head, most likely either a side branch  IPMN or a postinflammatory cyst. Consider follow-up pancreatic dedicated MRI for recharacterization and comparison to the September 2023 MRI. 6. Umbilical and inguinal fat hernias. 7. Osteopenia and degenerative change. 8. Chronic calcified disc extrusion T9-10 with at least mild mass-effect suspected on the distal spinal cord.  Aortic Atherosclerosis (ICD10-I70.0).   Electronically Signed By: Francis Quam M.D. On: 08/02/2023 01:10  Results for orders placed during the hospital encounter of 07/14/23  CT Renal Stone Study  Narrative CLINICAL DATA:  Right flank pain radiating to groin, hematuria  EXAM: CT ABDOMEN AND PELVIS WITHOUT CONTRAST  TECHNIQUE: Multidetector CT imaging of the abdomen and pelvis was performed following the standard protocol without IV contrast.  RADIATION DOSE REDUCTION: This exam was performed according to the departmental dose-optimization program which includes automated exposure  control, adjustment of the mA and/or kV according to patient size and/or use of iterative reconstruction technique.  COMPARISON:  03/05/2023  FINDINGS: Lower chest: No acute pleural or parenchymal lung disease.  Hepatobiliary: Unremarkable unenhanced appearance of the liver and gallbladder.  Pancreas: Unremarkable unenhanced appearance.  Spleen: Unremarkable unenhanced appearance.  Adrenals/Urinary Tract: There is moderate right-sided hydronephrosis and hydroureter, with fat stranding surrounding the mid right ureter. I do not see any obstructing calculus or mass on this unenhanced exam. Stable exophytic simple cyst upper pole right kidney does not require specific follow-up.  The left kidney is unremarkable. Bladder is decompressed, limiting its evaluation. No evidence of bladder calculi. The adrenals are unremarkable.  Stomach/Bowel: No bowel obstruction or ileus. Normal appendix right lower quadrant. No bowel wall thickening or inflammatory change.  Vascular/Lymphatic: Aortic atherosclerosis. No enlarged abdominal or pelvic lymph nodes.  Reproductive: Status post hysterectomy. No adnexal masses.  Other: No free fluid or free intraperitoneal gas. No abdominal wall hernia.  Musculoskeletal: No acute or destructive bony abnormalities. Reconstructed images demonstrate no additional findings.  IMPRESSION: 1. Moderate right-sided hydronephrosis and hydroureter, with fat stranding surrounding the mid right ureter. I do not see a clear cause for obstruction on this exam, with no radiopaque calculus identified. Please correlate with urinalysis. If further evaluation is desired, retrograde ureteroscopy could be considered. 2.  Aortic Atherosclerosis (ICD10-I70.0).   Electronically Signed By: Ozell Daring M.D. On: 07/14/2023 19:07   Assessment & Plan:    1. Kidney cysts (Primary) Followup 1 year - Urinalysis, Routine w reflex microscopic  2. Gross  hematuria resolved   No follow-ups on file.  Belvie Clara, MD  [facility] [place]

## 2024-02-18 ENCOUNTER — Ambulatory Visit (HOSPITAL_COMMUNITY): Admitting: Anesthesiology

## 2024-02-18 ENCOUNTER — Encounter (HOSPITAL_COMMUNITY): Admission: RE | Disposition: A | Payer: Self-pay | Source: Home / Self Care | Attending: Internal Medicine

## 2024-02-18 ENCOUNTER — Other Ambulatory Visit: Payer: Self-pay

## 2024-02-18 ENCOUNTER — Encounter (HOSPITAL_COMMUNITY): Payer: Self-pay | Admitting: Internal Medicine

## 2024-02-18 ENCOUNTER — Ambulatory Visit (HOSPITAL_COMMUNITY)
Admission: RE | Admit: 2024-02-18 | Discharge: 2024-02-18 | Disposition: A | Discharge status: Home / Self Care | Attending: Internal Medicine | Admitting: Internal Medicine

## 2024-02-18 DIAGNOSIS — G35D Multiple sclerosis, unspecified: Secondary | ICD-10-CM | POA: Diagnosis not present

## 2024-02-18 DIAGNOSIS — Z1211 Encounter for screening for malignant neoplasm of colon: Secondary | ICD-10-CM

## 2024-02-18 DIAGNOSIS — Z79899 Other long term (current) drug therapy: Secondary | ICD-10-CM | POA: Insufficient documentation

## 2024-02-18 DIAGNOSIS — K5730 Diverticulosis of large intestine without perforation or abscess without bleeding: Secondary | ICD-10-CM

## 2024-02-18 DIAGNOSIS — K635 Polyp of colon: Secondary | ICD-10-CM | POA: Diagnosis not present

## 2024-02-18 DIAGNOSIS — D120 Benign neoplasm of cecum: Secondary | ICD-10-CM | POA: Diagnosis not present

## 2024-02-18 DIAGNOSIS — I10 Essential (primary) hypertension: Secondary | ICD-10-CM

## 2024-02-18 DIAGNOSIS — F17210 Nicotine dependence, cigarettes, uncomplicated: Secondary | ICD-10-CM | POA: Insufficient documentation

## 2024-02-18 DIAGNOSIS — Z860100 Personal history of colon polyps, unspecified: Secondary | ICD-10-CM | POA: Diagnosis not present

## 2024-02-18 DIAGNOSIS — Z09 Encounter for follow-up examination after completed treatment for conditions other than malignant neoplasm: Secondary | ICD-10-CM | POA: Diagnosis present

## 2024-02-18 DIAGNOSIS — I2699 Other pulmonary embolism without acute cor pulmonale: Secondary | ICD-10-CM | POA: Diagnosis not present

## 2024-02-18 SURGERY — COLONOSCOPY
Anesthesia: General

## 2024-02-18 MED ORDER — PROPOFOL 500 MG/50ML IV EMUL
INTRAVENOUS | Status: DC | PRN
  Administered 2024-02-18: 200 ug/kg/min via INTRAVENOUS

## 2024-02-18 MED ORDER — LACTATED RINGERS IV SOLN: INTRAVENOUS | Status: DC

## 2024-02-18 MED ORDER — PROPOFOL 10 MG/ML IV BOLUS
INTRAVENOUS | Status: DC | PRN
  Administered 2024-02-18: 100 mg via INTRAVENOUS

## 2024-02-18 NOTE — Transfer of Care (Signed)
 Immediate Anesthesia Transfer of Care Note  Patient: Joan [name]  Procedure(s) Performed: COLONOSCOPY  Patient Location: Endoscopy Unit  Anesthesia Type:General  Level of Consciousness: awake, alert , oriented, and patient cooperative  Airway & Oxygen Therapy: Patient Spontanous Breathing  Post-op Assessment: Report given to RN, Post -op Vital signs reviewed and stable, and Patient moving all extremities X 4  Post vital signs: Reviewed and stable  Last Vitals:  Vitals Value Taken Time  BP 80/69 02/18/24 11:15  Temp 36.8 C 02/18/24 11:15  Pulse 82 02/18/24 11:15  Resp 22 02/18/24 11:15  SpO2 97 % 02/18/24 11:15    Last Pain:  Vitals:   02/18/24 1115  TempSrc: Oral  PainSc:       Patients Stated Pain Goal: 5 (02/18/24 0912)  Complications: No notable events documented.

## 2024-02-18 NOTE — Op Note (Signed)
 [facility] Patient Name: Joan [name] Procedure Date: 02/18/2024 10:35 AM MRN: [id number] Date of Birth: 1955/06/09 Attending MD: Lamar Ozell Hollingshead , MD, [id number] CSN: [id number] Age: 68 Admit Type: Outpatient Procedure:                Colonoscopy Indications:              High risk colon cancer surveillance: Personal                            history of colonic polyps Providers:                Lamar Ozell Hollingshead, MD, Devere Lodge, Dorcas Lenis, Technician Referring MD:              Medicines:                Propofol  per Anesthesia Complications:            No immediate complications. Estimated Blood Loss:     Estimated blood loss was minimal. Procedure:                Pre-Anesthesia Assessment:                           - Prior to the procedure, a History and Physical                            was performed, and patient medications and                            allergies were reviewed. The patient's tolerance of                            previous anesthesia was also reviewed. The risks                            and benefits of the procedure and the sedation                            options and risks were discussed with the patient.                            All questions were answered, and informed consent                            was obtained. Prior Anticoagulants: The patient has                            taken no anticoagulant or antiplatelet agents. ASA                            Grade Assessment: III - A patient with severe  systemic disease. After reviewing the risks and                            benefits, the patient was deemed in satisfactory                            condition to undergo the procedure.                           After obtaining informed consent, the colonoscope                            was passed under direct vision. Throughout the                            procedure, the  patient's blood pressure, pulse, and                            oxygen saturations were monitored continuously. The                            [id number] ([id number]) Colon was introduced through                            the anus and advanced to the the cecum, identified                            by appendiceal orifice and ileocecal valve. The                            colonoscopy was performed without difficulty. The                            patient tolerated the procedure well. The quality                            of the bowel preparation was adequate. The                            ileocecal valve, appendiceal orifice, and rectum                            were photographed. Scope In: 10:51:41 AM Scope Out: 11:10:05 AM Scope Withdrawal Time: 0 hours 14 minutes 42 seconds  Total Procedure Duration: 0 hours 18 minutes 24 seconds  Findings:      The perianal and digital rectal examinations were normal.      Scattered medium-mouthed diverticula were found in the sigmoid colon and       descending colon.      Three sessile polyps were found in the descending colon and ileocecal       valve. The polyps were 5 to 8 mm in size. These polyps were removed with       a cold snare. Resection and retrieval were complete. Estimated blood       loss was minimal.  The exam was otherwise without abnormality on direct and retroflexion       views. Impression:               - Diverticulosis in the sigmoid colon and in the                            descending colon.                           - Three 5 to 8 mm polyps in the descending colon                            and at the ileocecal valve, removed with a cold                            snare. Resected and retrieved.                           - The examination was otherwise normal on direct                            and retroflexion views. Moderate Sedation:      Moderate (conscious) sedation was personally administered by an        anesthesia professional. The following parameters were monitored: oxygen       saturation, heart rate, blood pressure, respiratory rate, EKG, adequacy       of pulmonary ventilation, and response to care. Recommendation:           - Patient has a contact number available for                            emergencies. The signs and symptoms of potential                            delayed complications were discussed with the                            patient. Return to normal activities tomorrow.                            Written discharge instructions were provided to the                            patient.                           - Advance diet as tolerated.                           - Continue present medications.                           - Repeat colonoscopy date to be determined after                            pending  pathology results are reviewed for                            surveillance.                           - Return to GI office (date not yet determined). Procedure Code(s):        --- Professional ---                           [phone], Colonoscopy, flexible; with removal of                            tumor(s), polyp(s), or other lesion(s) by snare                            technique Diagnosis Code(s):        --- Professional ---                           Z86.010, Personal history of colonic polyps                           D12.4, Benign neoplasm of descending colon                           D12.0, Benign neoplasm of cecum                           K57.30, Diverticulosis of large intestine without                            perforation or abscess without bleeding CPT copyright 2022 American [employer] Association. All rights reserved. The codes documented in this report are preliminary and upon coder review may  be revised to meet current compliance requirements. Lamar HERO. Fraser Busche, MD Lamar Ozell Hollingshead, MD 02/18/2024 11:19:37 AM This report has been signed  electronically. Number of Addenda: 0

## 2024-02-18 NOTE — Anesthesia Postprocedure Evaluation (Signed)
 Anesthesia Post Note  Patient: Joan [name]  Procedure(s) Performed: COLONOSCOPY  Patient location during evaluation: Endoscopy Anesthesia Type: General Level of consciousness: awake and alert Pain management: pain level controlled Vital Signs Assessment: post-procedure vital signs reviewed and stable Respiratory status: spontaneous breathing, nonlabored ventilation and respiratory function stable Cardiovascular status: stable Anesthetic complications: no   There were no known notable events for this encounter.   Last Vitals:  Vitals:   02/18/24 1120 02/18/24 1122  BP: (!) 89/77 99/70  Pulse: 79   Resp: 14   Temp:    SpO2: 100%     Last Pain:  Vitals:   02/18/24 1120  TempSrc:   PainSc: 0-No pain                 Jannett Schmall L Shakoya Gilmore

## 2024-02-18 NOTE — Anesthesia Preprocedure Evaluation (Signed)
 Anesthesia Evaluation  Patient identified by MRN, date of birth, ID band Patient awake    Reviewed: Allergy & Precautions, NPO status , Patient's Chart, lab work & pertinent test results  History of Anesthesia Complications (+) PONV and history of anesthetic complications  Airway Mallampati: II  TM Distance: >3 FB Neck ROM: Full    Dental no notable dental hx. (+) Teeth Intact, Dental Advisory Given   Pulmonary Current Smoker and Patient abstained from smoking., PE   Pulmonary exam normal breath sounds clear to auscultation       Cardiovascular hypertension, Pt. on medications Normal cardiovascular exam Rhythm:Regular Rate:Normal     Neuro/Psych  Headaches  Neuromuscular disease (MS, right sided weakness, stable)  negative psych ROS   GI/Hepatic negative GI ROS, Neg liver ROS,,,  Endo/Other  negative endocrine ROS    Renal/GU negative Renal ROS  negative genitourinary   Musculoskeletal  (+) Arthritis ,    Abdominal   Peds  Hematology negative hematology ROS (+)   Anesthesia Other Findings   Reproductive/Obstetrics                              Anesthesia Physical Anesthesia Plan  ASA: 3  Anesthesia Plan: General   Post-op Pain Management: Minimal or no pain anticipated   Induction: Intravenous  PONV Risk Score and Plan: Propofol  infusion  Airway Management Planned: Nasal Cannula and Natural Airway  Additional Equipment: None  Intra-op Plan:   Post-operative Plan:   Informed Consent: I have reviewed the patients History and Physical, chart, labs and discussed the procedure including the risks, benefits and alternatives for the proposed anesthesia with the patient or authorized representative who has indicated his/her understanding and acceptance.     Dental advisory given  Plan Discussed with: CRNA  Anesthesia Plan Comments:          Anesthesia Quick  Evaluation

## 2024-02-18 NOTE — Discharge Instructions (Signed)
  Colonoscopy Discharge Instructions  Read the instructions outlined below and refer to this sheet in the next few weeks. These discharge instructions provide you with general information on caring for yourself after you leave the hospital. Your doctor may also give you specific instructions. While your treatment has been planned according to the most current medical practices available, unavoidable complications occasionally occur. If you have any problems or questions after discharge, call Dr. Shaaron at [phone]. ACTIVITY You may resume your regular activity, but move at a slower pace for the next 24 hours.  Take frequent rest periods for the next 24 hours.  Walking will help get rid of the air and reduce the bloated feeling in your belly (abdomen).  No driving for 24 hours (because of the medicine (anesthesia) used during the test).   Do not sign any important legal documents or operate any machinery for 24 hours (because of the anesthesia used during the test).  NUTRITION Drink plenty of fluids.  You may resume your normal diet as instructed by your doctor.  Begin with a light meal and progress to your normal diet. Heavy or fried foods are harder to digest and may make you feel sick to your stomach (nauseated).  Avoid alcoholic beverages for 24 hours or as instructed.  MEDICATIONS You may resume your normal medications unless your doctor tells you otherwise.  WHAT YOU CAN EXPECT TODAY Some feelings of bloating in the abdomen.  Passage of more gas than usual.  Spotting of blood in your stool or on the toilet paper.  IF YOU HAD POLYPS REMOVED DURING THE COLONOSCOPY: No aspirin products for 7 days or as instructed.  No alcohol for 7 days or as instructed.  Eat a soft diet for the next 24 hours.  FINDING OUT THE RESULTS OF YOUR TEST Not all test results are available during your visit. If your test results are not back during the visit, make an appointment with your caregiver to find out the  results. Do not assume everything is normal if you have not heard from your caregiver or the medical facility. It is important for you to follow up on all of your test results.  SEEK IMMEDIATE MEDICAL ATTENTION IF: You have more than a spotting of blood in your stool.  Your belly is swollen (abdominal distention).  You are nauseated or vomiting.  You have a temperature over 101.  You have abdominal pain or discomfort that is severe or gets worse throughout the day.       3 small polyps found and removed    diverticulosis information provided     further recommendations to follow pending review of pathology report

## 2024-02-18 NOTE — Interval H&P Note (Signed)
 History and Physical Interval Note:  02/18/2024 10:34 AM  Joan [name]  has presented today for surgery, with the diagnosis of history of polyps.  The various methods of treatment have been discussed with the patient and family. After consideration of risks, benefits and other options for treatment, the patient has consented to  Procedure(s) with comments: COLONOSCOPY (N/A) - 10:15 am, ok rm 1 as a surgical intervention.  The patient's history has been reviewed, patient examined, no change in status, stable for surgery.  I have reviewed the patient's chart and labs.  Questions were answered to the patient's satisfaction.     Joan [name]       No change.  Surveillance colonoscopy today per plan.  The risks, benefits, limitations, alternatives and imponderables have been reviewed with the patient. Questions have been answered. All parties are agreeable.

## 2024-02-21 ENCOUNTER — Encounter (HOSPITAL_COMMUNITY): Payer: Self-pay | Admitting: Internal Medicine

## 2024-02-21 LAB — SURGICAL PATHOLOGY

## 2024-02-22 ENCOUNTER — Ambulatory Visit: Payer: Self-pay | Admitting: Internal Medicine

## 2024-02-23 DIAGNOSIS — C539 Malignant neoplasm of cervix uteri, unspecified: Secondary | ICD-10-CM | POA: Diagnosis not present

## 2024-02-23 DIAGNOSIS — Z9221 Personal history of antineoplastic chemotherapy: Secondary | ICD-10-CM | POA: Diagnosis not present

## 2024-02-23 DIAGNOSIS — Z923 Personal history of irradiation: Secondary | ICD-10-CM | POA: Diagnosis not present

## 2024-02-29 DIAGNOSIS — Z23 Encounter for immunization: Secondary | ICD-10-CM | POA: Diagnosis not present

## 2024-03-01 ENCOUNTER — Ambulatory Visit: Admitting: Neurology

## 2024-03-01 ENCOUNTER — Encounter: Payer: Self-pay | Admitting: Neurology

## 2024-03-01 VITALS — BP 112/73 | HR 79 | Ht 66.0 in | Wt 209.0 lb

## 2024-03-01 DIAGNOSIS — R269 Unspecified abnormalities of gait and mobility: Secondary | ICD-10-CM | POA: Diagnosis not present

## 2024-03-01 DIAGNOSIS — C530 Malignant neoplasm of endocervix: Secondary | ICD-10-CM | POA: Diagnosis not present

## 2024-03-01 DIAGNOSIS — R208 Other disturbances of skin sensation: Secondary | ICD-10-CM | POA: Diagnosis not present

## 2024-03-01 DIAGNOSIS — R200 Anesthesia of skin: Secondary | ICD-10-CM

## 2024-03-01 DIAGNOSIS — G35C1 Active secondary progressive multiple sclerosis: Secondary | ICD-10-CM | POA: Diagnosis not present

## 2024-03-01 DIAGNOSIS — Z79899 Other long term (current) drug therapy: Secondary | ICD-10-CM
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
## 2024-03-02 ENCOUNTER — Encounter: Payer: Self-pay | Admitting: Radiation Oncology

## 2024-03-02 ENCOUNTER — Telehealth: Payer: Self-pay | Admitting: Neurology

## 2024-03-02 ENCOUNTER — Ambulatory Visit: Payer: Self-pay | Admitting: Neurology

## 2024-03-02 ENCOUNTER — Ambulatory Visit
Admission: RE | Admit: 2024-03-02 | Discharge: 2024-03-02 | Disposition: A | Discharge status: Home / Self Care | Source: Ambulatory Visit | Attending: Radiation Oncology | Admitting: Radiation Oncology

## 2024-03-02 VITALS — BP 117/73 | HR 79 | Temp 96.8°F | Resp 18 | Ht 66.0 in | Wt 210.8 lb

## 2024-03-02 DIAGNOSIS — Z79899 Other long term (current) drug therapy: Secondary | ICD-10-CM | POA: Diagnosis not present

## 2024-03-02 DIAGNOSIS — Z923 Personal history of irradiation: Secondary | ICD-10-CM | POA: Insufficient documentation

## 2024-03-02 DIAGNOSIS — Z90710 Acquired absence of both cervix and uterus: Secondary | ICD-10-CM | POA: Insufficient documentation

## 2024-03-02 DIAGNOSIS — Z8542 Personal history of malignant neoplasm of other parts of uterus: Secondary | ICD-10-CM | POA: Insufficient documentation

## 2024-03-02 DIAGNOSIS — C530 Malignant neoplasm of endocervix: Secondary | ICD-10-CM

## 2024-03-02 DIAGNOSIS — C541 Malignant neoplasm of endometrium: Secondary | ICD-10-CM | POA: Diagnosis not present

## 2024-03-02 DIAGNOSIS — Z9221 Personal history of antineoplastic chemotherapy: Secondary | ICD-10-CM | POA: Diagnosis not present

## 2024-03-02 DIAGNOSIS — S3210XA Unspecified fracture of sacrum, initial encounter for closed fracture: Secondary | ICD-10-CM | POA: Insufficient documentation

## 2024-03-02 LAB — CBC WITH DIFFERENTIAL/PLATELET
Basophils Absolute: 0 x10E3/uL (ref 0.0–0.2)
Basos: 0 %
EOS (ABSOLUTE): 0.1 x10E3/uL (ref 0.0–0.4)
Eos: 1 %
Hematocrit: 34.7 % (ref 34.0–46.6)
Hemoglobin: 11.5 g/dL (ref 11.1–15.9)
Immature Grans (Abs): 0 x10E3/uL (ref 0.0–0.1)
Immature Granulocytes: 0 %
Lymphocytes Absolute: 0.4 x10E3/uL — ABNORMAL LOW (ref 0.7–3.1)
Lymphs: 7 %
MCH: 32.7 pg (ref 26.6–33.0)
MCHC: 33.1 g/dL (ref 31.5–35.7)
MCV: 99 fL — ABNORMAL HIGH (ref 79–97)
Monocytes Absolute: 0.5 x10E3/uL (ref 0.1–0.9)
Monocytes: 8 %
Neutrophils Absolute: 5 x10E3/uL (ref 1.4–7.0)
Neutrophils: 84 %
Platelets: 235 x10E3/uL (ref 150–450)
RBC: 3.52 x10E6/uL — ABNORMAL LOW (ref 3.77–5.28)
RDW: 12.5 % (ref 11.7–15.4)
WBC: 5.9 x10E3/uL (ref 3.4–10.8)

## 2024-03-02 NOTE — Progress Notes (Signed)
 Radiation Oncology         [phone] ________________________________  Name: Joan [name] MRN: [id number]  Date: 03/02/2024  DOB: [birth year]  Follow-Up Visit Note  CC: Shona Norleen PEDLAR, MD  Shona Norleen PEDLAR, MD    ICD-10-CM   1. 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 She was last seen in office on 09/09/23 for a follow up visit. Patient continued to follow up with their specialists to manage their chronic conditions.   In the interval since she was last seen, she presented for a follow up visit with Dr. Viktoria on 11/26/23 during which she was noted on NED on exam.   She underwent a post-op PET scan on 12/02/23 showing a hysterectomy, without hypermetabolic residual or metastatic disease. Scan also noted mild hypermetabolism (SUV 3.9) and subtle heterogeneous increased density within the lateral right sacrum, favoring insufficiency fracture, presumably secondary to radiation therapy. No suspicious focal osseous lesion.  She most recently saw her medical oncologist, Dr. Letty, at [facility], on 02/23/2024.   he recommended removing her Mediport at that time and continuing under surveillance with a reevaluation in 6 months.  Of note: she underwent a colonoscopy on 02/18/24 under the care of Dr. Shaaron  with pathology showing tubular adenoma (2) without high grade dysplasia.  She also followed up with Dr. Sherrilee concerning her episode of gross hematuria.  Dr. Sherrilee felt she is doing well and is scheduled for yearly follow-up.                       Patient is doing well overall today. She denies any abdominal pain, abnormal bloating, vaginal bleeding, or changes in her bowel or bladder habits. She denies any pelvic pain. She is using her dilators regularly.  She denies any vaginal spotting after dilator use.  She has been walking for exercise and weight loss but developed some discomfort in the left pelvis which is limited this exercise.  This is gradually getting better.  Of note the question of insufficiency fracture was noted along the right sacrum.   Allergies:  has no known allergies.  Meds: Current Outpatient Medications  Medication Sig Dispense Refill   Cholecalciferol  (VITAMIN D3) 1000 units CAPS Take by mouth.     Dimethyl Fumarate  240 MG CPDR Take 1 capsule (240 mg total) by mouth 2 (two) times daily. 180 capsule 1   Na Sulfate-K Sulfate-Mg Sulfate concentrate (SUPREP) 17.5-3.13-1.6 GM/177ML SOLN As directed 354 mL 0   rosuvastatin  (CRESTOR ) 10 MG tablet Take 1 tablet (10 mg total) by mouth daily. 90 tablet 1   valsartan -hydrochlorothiazide  (DIOVAN -HCT) 80-12.5 MG tablet Take 1 tablet by mouth daily. 90 tablet 0   No current facility-administered medications for this encounter.    Physical Findings: The patient is in no acute distress.  Patient is alert and oriented.  height is 5' 6 (1.676 m) and weight is 210 lb 12.8 oz (95.6 kg). Her temperature is 96.8 F (36 C) (abnormal). Her blood pressure is 117/73 and her pulse is 79. Her respiration is 18 and oxygen saturation is 99%. .  No significant changes. Lungs are clear to auscultation bilaterally. Heart has regular rate and rhythm. No palpable cervical, supraclavicular, or axillary adenopathy. Abdomen soft, non-tender, normal  bowel sounds.  Pelvic exam reveals normal appearing external female genitalia.  A speculum exam was performed.  No mucosal lesions noted in the vaginal vault.  Very minimal radiation changes noted at the cuff.  On bimanual examination no pelvic masses appreciated or tenderness.  Vaginal cuff intact.  Rectovaginal examination confirms bimanual exam.    Lab Findings: Lab Results  Component Value Date   WBC 5.9 03/01/2024   HGB 11.5 03/01/2024   HCT 34.7 03/01/2024   MCV 99 (H) 03/01/2024   PLT 235 03/01/2024    Radiographic Findings: No results found.  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  Overall she is doing quite well at this time  Patient is asymptomatic from right sacral fracture. It would be unlikely this is secondary to radiation therapy given the location.  She received IMRT at [facility] anad typically minimal dose would be given to the sacrum with this treatment.  Plan:  Per NCCN guidelines, patient will follow up with Dr. Viktoria in 3 months and radiation oncology in 6 months. Patient was educated on signs/symptoms that may indicate disease recurrence and understands to notify us  if she experiences any of them.    30 minutes of total time was spent for this patient encounter, including preparation, face-to-face counseling with the patient and coordination of care, physical exam, and documentation of the encounter. ____________________________________   Leeroy Due, PA-C   Lynwood CHARM Nasuti, PhD, MD   [facility]  Radiation Oncology Direct Dial: [phone]  Fax: [phone] [email or web address]     This document serves as a record of services personally performed by  Lynwood Nasuti, MD. It was created on his behalf by Reymundo Cartwright, a trained medical scribe. The creation of this record is based on the scribe's personal observations and the provider's statements to them. This document has been checked and approved by the attending provider.

## 2024-03-02 NOTE — Progress Notes (Signed)
 Joan [name] is here today for follow up post radiation to the pelvic.  They completed their radiation on: 08/06/23   Does the patient complain of any of the following:  Pain:No Abdominal bloating: No Diarrhea/Constipation: No Nausea/Vomiting: No Vaginal Discharge: No Blood in Urine or Stool: No Urinary Issues (dysuria/incomplete emptying/ incontinence/ increased frequency/urgency): No Does patient report using vaginal dilator 2-3 times a week and/or sexually active 2-3 weeks: Yes, using vaginal dilators.  Post radiation skin changes: No    Additional comments if applicable:    BP 117/73 (BP Location: Right Arm, Patient Position: Sitting, Cuff Size: Large)   Pulse 79   Temp (!) 96.8 F (36 C)   Resp 18   Ht 5' 6 (1.676 m)   Wt 210 lb 12.8 oz (95.6 kg)   SpO2 99%   BMI 34.02 kg/m

## 2024-03-02 NOTE — Telephone Encounter (Signed)
 no auth required sent to [facility] [phone]

## 2024-03-14 ENCOUNTER — Ambulatory Visit (HOSPITAL_COMMUNITY)
Admission: RE | Admit: 2024-03-14 | Discharge: 2024-03-14 | Disposition: A | Source: Ambulatory Visit | Attending: Neurology | Admitting: Neurology

## 2024-03-14 DIAGNOSIS — G35C1 Active secondary progressive multiple sclerosis: Secondary | ICD-10-CM | POA: Diagnosis not present

## 2024-03-14 DIAGNOSIS — G35D Multiple sclerosis, unspecified: Secondary | ICD-10-CM | POA: Diagnosis not present

## 2024-03-14 DIAGNOSIS — R930 Abnormal findings on diagnostic imaging of skull and head, not elsewhere classified: Secondary | ICD-10-CM | POA: Diagnosis not present

## 2024-03-30 ENCOUNTER — Other Ambulatory Visit (HOSPITAL_COMMUNITY): Payer: Self-pay | Admitting: Internal Medicine

## 2024-03-30 DIAGNOSIS — Z1231 Encounter for screening mammogram for malignant neoplasm of breast: Secondary | ICD-10-CM

## 2024-05-08 ENCOUNTER — Ambulatory Visit (HOSPITAL_COMMUNITY)

## 2024-05-10 ENCOUNTER — Other Ambulatory Visit: Payer: Self-pay

## 2024-05-10 ENCOUNTER — Other Ambulatory Visit (HOSPITAL_COMMUNITY): Payer: Self-pay

## 2024-05-10 ENCOUNTER — Other Ambulatory Visit (HOSPITAL_BASED_OUTPATIENT_CLINIC_OR_DEPARTMENT_OTHER): Payer: Self-pay

## 2024-05-10 MED ORDER — ROSUVASTATIN CALCIUM 10 MG PO TABS
10.0000 mg | ORAL_TABLET | Freq: Every day | ORAL | 2 refills | Status: AC
  Filled 2024-05-10: qty 90, 90d supply, fill #0

## 2024-05-10 MED ORDER — VALSARTAN-HYDROCHLOROTHIAZIDE 80-12.5 MG PO TABS
1.0000 | ORAL_TABLET | Freq: Every day | ORAL | 2 refills | Status: AC
  Filled 2024-05-10: qty 90, 90d supply, fill #0

## 2024-05-17 ENCOUNTER — Encounter (HOSPITAL_COMMUNITY): Payer: Self-pay

## 2024-05-17 ENCOUNTER — Ambulatory Visit (HOSPITAL_COMMUNITY)
Admission: RE | Admit: 2024-05-17 | Discharge: 2024-05-17 | Disposition: A | Source: Ambulatory Visit | Attending: Internal Medicine | Admitting: Internal Medicine

## 2024-05-17 DIAGNOSIS — Z1231 Encounter for screening mammogram for malignant neoplasm of breast: Secondary | ICD-10-CM

## 2024-05-26 ENCOUNTER — Ambulatory Visit: Admitting: Gynecologic Oncology

## 2024-08-24 ENCOUNTER — Ambulatory Visit: Admitting: Radiation Oncology

## 2024-11-06 ENCOUNTER — Ambulatory Visit: Admitting: Neurology
